# Patient Record
Sex: Female | Born: 1965 | Race: Black or African American | Hispanic: No | Smoking: Never smoker
Health system: Southern US, Community
[De-identification: ages and names within clinical notes are randomized; demographics above are authoritative.]

## PROBLEM LIST (undated history)

## (undated) DIAGNOSIS — E785 Hyperlipidemia, unspecified: Secondary | ICD-10-CM

## (undated) DIAGNOSIS — J189 Pneumonia, unspecified organism: Secondary | ICD-10-CM

## (undated) DIAGNOSIS — K219 Gastro-esophageal reflux disease without esophagitis: Secondary | ICD-10-CM

## (undated) DIAGNOSIS — E8809 Other disorders of plasma-protein metabolism, not elsewhere classified: Secondary | ICD-10-CM

## (undated) DIAGNOSIS — R011 Cardiac murmur, unspecified: Secondary | ICD-10-CM

## (undated) DIAGNOSIS — E11319 Type 2 diabetes mellitus with unspecified diabetic retinopathy without macular edema: Secondary | ICD-10-CM

## (undated) DIAGNOSIS — I10 Essential (primary) hypertension: Secondary | ICD-10-CM

## (undated) DIAGNOSIS — R079 Chest pain, unspecified: Secondary | ICD-10-CM

## (undated) DIAGNOSIS — N183 Chronic kidney disease, stage 3 unspecified: Secondary | ICD-10-CM

## (undated) DIAGNOSIS — D649 Anemia, unspecified: Secondary | ICD-10-CM

## (undated) DIAGNOSIS — I509 Heart failure, unspecified: Secondary | ICD-10-CM

## (undated) HISTORY — PX: CARDIAC CATHETERIZATION: SHX172

## (undated) HISTORY — DX: Chronic kidney disease, stage 3 unspecified: N18.30

## (undated) HISTORY — PX: EYE SURGERY: SHX253

## (undated) HISTORY — PX: BREAST SURGERY: SHX581

## (undated) HISTORY — PX: DILATION AND CURETTAGE OF UTERUS: SHX78

## (undated) HISTORY — DX: Chronic kidney disease, stage 3 (moderate): N18.3

## (undated) HISTORY — DX: Other disorders of plasma-protein metabolism, not elsewhere classified: E88.09

## (undated) HISTORY — DX: Anemia, unspecified: D64.9

## (undated) HISTORY — DX: Chest pain, unspecified: R07.9

---
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
## 1993-01-31 HISTORY — PX: TUBAL LIGATION: SHX77

## 2010-11-29 ENCOUNTER — Emergency Department (HOSPITAL_COMMUNITY): Payer: Self-pay

## 2010-11-29 ENCOUNTER — Emergency Department (HOSPITAL_COMMUNITY)
Admission: EM | Admit: 2010-11-29 | Discharge: 2010-11-29 | Disposition: A | Discharge status: Home / Self Care | Payer: Self-pay | Attending: Emergency Medicine | Admitting: Emergency Medicine

## 2010-11-29 DIAGNOSIS — E785 Hyperlipidemia, unspecified: Secondary | ICD-10-CM | POA: Insufficient documentation

## 2010-11-29 DIAGNOSIS — Z91199 Patient's noncompliance with other medical treatment and regimen due to unspecified reason: Secondary | ICD-10-CM | POA: Insufficient documentation

## 2010-11-29 DIAGNOSIS — R350 Frequency of micturition: Secondary | ICD-10-CM | POA: Insufficient documentation

## 2010-11-29 DIAGNOSIS — I10 Essential (primary) hypertension: Secondary | ICD-10-CM | POA: Insufficient documentation

## 2010-11-29 DIAGNOSIS — R358 Other polyuria: Secondary | ICD-10-CM | POA: Insufficient documentation

## 2010-11-29 DIAGNOSIS — R079 Chest pain, unspecified: Secondary | ICD-10-CM | POA: Insufficient documentation

## 2010-11-29 DIAGNOSIS — R3589 Other polyuria: Secondary | ICD-10-CM | POA: Insufficient documentation

## 2010-11-29 DIAGNOSIS — E119 Type 2 diabetes mellitus without complications: Secondary | ICD-10-CM | POA: Insufficient documentation

## 2010-11-29 DIAGNOSIS — R5383 Other fatigue: Secondary | ICD-10-CM | POA: Insufficient documentation

## 2010-11-29 DIAGNOSIS — R5381 Other malaise: Secondary | ICD-10-CM | POA: Insufficient documentation

## 2010-11-29 DIAGNOSIS — IMO0001 Reserved for inherently not codable concepts without codable children: Secondary | ICD-10-CM | POA: Insufficient documentation

## 2010-11-29 DIAGNOSIS — Z9119 Patient's noncompliance with other medical treatment and regimen: Secondary | ICD-10-CM | POA: Insufficient documentation

## 2010-11-29 DIAGNOSIS — N390 Urinary tract infection, site not specified: Secondary | ICD-10-CM | POA: Insufficient documentation

## 2010-11-29 LAB — URINALYSIS, ROUTINE W REFLEX MICROSCOPIC
Glucose, UA: >1000 mg/dL — AB
Specific Gravity, Urine: 1.027 (ref 1.005–1.030)
pH: 5 (ref 5.0–8.0)

## 2010-11-29 LAB — CBC
HCT: 36.4 % (ref 36.0–46.0)
Platelets: 267 10*3/uL (ref 150–400)
RBC: 4.03 MIL/uL (ref 3.87–5.11)
RDW: 12.3 % (ref 11.5–15.5)
WBC: 5.8 10*3/uL (ref 4.0–10.5)

## 2010-11-29 LAB — DIFFERENTIAL
Basophils Absolute: 0 10*3/uL (ref 0.0–0.1)
Eosinophils Absolute: 0.1 10*3/uL (ref 0.0–0.7)
Eosinophils Relative: 1 % (ref 0–5)
Lymphocytes Relative: 31 % (ref 12–46)
Neutrophils Relative %: 63 % (ref 43–77)

## 2010-11-29 LAB — GLUCOSE, CAPILLARY: Glucose-Capillary: 490 mg/dL — ABNORMAL HIGH (ref 70–99)

## 2010-11-29 LAB — POCT I-STAT 3, VENOUS BLOOD GAS (G3P V)
pCO2, Ven: 51.9 mmHg — ABNORMAL HIGH (ref 45.0–50.0)
pO2, Ven: 23 mmHg — CL (ref 30.0–45.0)

## 2010-11-29 LAB — BASIC METABOLIC PANEL
CO2: 23 mEq/L (ref 19–32)
Glucose, Bld: 448 mg/dL — ABNORMAL HIGH (ref 70–99)
Potassium: 4.2 mEq/L (ref 3.5–5.1)
Sodium: 131 mEq/L — ABNORMAL LOW (ref 135–145)

## 2010-11-29 LAB — URINE MICROSCOPIC-ADD ON

## 2011-03-18 ENCOUNTER — Other Ambulatory Visit: Payer: Self-pay

## 2011-03-18 ENCOUNTER — Emergency Department (HOSPITAL_COMMUNITY): Payer: 59

## 2011-03-18 ENCOUNTER — Encounter (HOSPITAL_COMMUNITY): Payer: Self-pay

## 2011-03-18 ENCOUNTER — Inpatient Hospital Stay (HOSPITAL_COMMUNITY)
Admission: EM | Admit: 2011-03-18 | Discharge: 2011-03-19 | DRG: 313 | Disposition: A | Discharge status: Home / Self Care | Payer: 59 | Attending: Internal Medicine | Admitting: Internal Medicine

## 2011-03-18 DIAGNOSIS — R0789 Other chest pain: Principal | ICD-10-CM | POA: Diagnosis present

## 2011-03-18 DIAGNOSIS — Z23 Encounter for immunization: Secondary | ICD-10-CM

## 2011-03-18 DIAGNOSIS — Z7982 Long term (current) use of aspirin: Secondary | ICD-10-CM

## 2011-03-18 DIAGNOSIS — Z79899 Other long term (current) drug therapy: Secondary | ICD-10-CM

## 2011-03-18 DIAGNOSIS — E1151 Type 2 diabetes mellitus with diabetic peripheral angiopathy without gangrene: Secondary | ICD-10-CM | POA: Diagnosis present

## 2011-03-18 DIAGNOSIS — E669 Obesity, unspecified: Secondary | ICD-10-CM

## 2011-03-18 DIAGNOSIS — IMO0002 Reserved for concepts with insufficient information to code with codable children: Secondary | ICD-10-CM | POA: Diagnosis present

## 2011-03-18 DIAGNOSIS — Z6830 Body mass index (BMI) 30.0-30.9, adult: Secondary | ICD-10-CM

## 2011-03-18 DIAGNOSIS — IMO0001 Reserved for inherently not codable concepts without codable children: Secondary | ICD-10-CM | POA: Diagnosis present

## 2011-03-18 DIAGNOSIS — E1165 Type 2 diabetes mellitus with hyperglycemia: Secondary | ICD-10-CM

## 2011-03-18 DIAGNOSIS — I739 Peripheral vascular disease, unspecified: Secondary | ICD-10-CM | POA: Diagnosis present

## 2011-03-18 DIAGNOSIS — R079 Chest pain, unspecified: Secondary | ICD-10-CM

## 2011-03-18 DIAGNOSIS — Z794 Long term (current) use of insulin: Secondary | ICD-10-CM

## 2011-03-18 DIAGNOSIS — I10 Essential (primary) hypertension: Secondary | ICD-10-CM | POA: Diagnosis present

## 2011-03-18 DIAGNOSIS — E785 Hyperlipidemia, unspecified: Secondary | ICD-10-CM | POA: Diagnosis present

## 2011-03-18 HISTORY — DX: Hyperlipidemia, unspecified: E78.5

## 2011-03-18 HISTORY — DX: Essential (primary) hypertension: I10

## 2011-03-18 LAB — CBC
HCT: 34.3 % — ABNORMAL LOW (ref 36.0–46.0)
Hemoglobin: 12 g/dL (ref 12.0–15.0)
MCH: 30.6 pg (ref 26.0–34.0)
MCHC: 35 g/dL (ref 30.0–36.0)
MCV: 87.5 fL (ref 78.0–100.0)
Platelets: 253 10*3/uL (ref 150–400)
RBC: 3.92 MIL/uL (ref 3.87–5.11)
RDW: 12.9 % (ref 11.5–15.5)
WBC: 4 10*3/uL (ref 4.0–10.5)

## 2011-03-18 LAB — LIPID PANEL
Cholesterol: 253 mg/dL — ABNORMAL HIGH (ref 0–200)
HDL: 45 mg/dL (ref >39)
LDL Cholesterol: 174 mg/dL — ABNORMAL HIGH (ref 0–99)
Total CHOL/HDL Ratio: 5.6 RATIO
Triglycerides: 171 mg/dL — ABNORMAL HIGH (ref <150)
VLDL: 34 mg/dL (ref 0–40)

## 2011-03-18 LAB — BASIC METABOLIC PANEL
BUN: 16 mg/dL (ref 6–23)
CO2: 23 mEq/L (ref 19–32)
Calcium: 8.9 mg/dL (ref 8.4–10.5)
Chloride: 98 mEq/L (ref 96–112)
Creatinine, Ser: 1.05 mg/dL (ref 0.50–1.10)
GFR calc Af Amer: 74 mL/min — ABNORMAL LOW (ref >90)
GFR calc non Af Amer: 64 mL/min — ABNORMAL LOW (ref >90)
Glucose, Bld: 584 mg/dL (ref 70–99)
Potassium: 4.3 mEq/L (ref 3.5–5.1)
Sodium: 130 mEq/L — ABNORMAL LOW (ref 135–145)

## 2011-03-18 LAB — TSH: TSH: 0.466 u[IU]/mL (ref 0.350–4.500)

## 2011-03-18 LAB — GLUCOSE, CAPILLARY: Glucose-Capillary: 580 mg/dL (ref 70–99)

## 2011-03-18 LAB — CARDIAC PANEL(CRET KIN+CKTOT+MB+TROPI)
CK, MB: 1.4 ng/mL (ref 0.3–4.0)
Relative Index: INVALID (ref 0.0–2.5)
Total CK: 50 U/L (ref 7–177)
Troponin I: <0.3 ng/mL (ref <0.30)

## 2011-03-18 LAB — HEMOGLOBIN A1C
Hgb A1c MFr Bld: 15.4 % — ABNORMAL HIGH (ref <5.7)
Mean Plasma Glucose: 395 mg/dL — ABNORMAL HIGH (ref <117)

## 2011-03-18 LAB — TROPONIN I: Troponin I: <0.3 ng/mL (ref <0.30)

## 2011-03-18 MED ORDER — ACETAMINOPHEN 325 MG PO TABS
650.0000 mg | ORAL_TABLET | Freq: Four times a day (QID) | ORAL | Status: DC | PRN
  Administered 2011-03-18: 650 mg via ORAL
  Filled 2011-03-18: qty 2

## 2011-03-18 MED ORDER — INSULIN ASPART 100 UNIT/ML ~~LOC~~ SOLN
0.0000 [IU] | Freq: Three times a day (TID) | SUBCUTANEOUS | Status: DC
  Administered 2011-03-19 (×2): 8 [IU] via SUBCUTANEOUS

## 2011-03-18 MED ORDER — INSULIN ASPART 100 UNIT/ML ~~LOC~~ SOLN
0.0000 [IU] | Freq: Every day | SUBCUTANEOUS | Status: DC
  Administered 2011-03-18: 5 [IU] via SUBCUTANEOUS
  Filled 2011-03-18: qty 3

## 2011-03-18 MED ORDER — ENOXAPARIN SODIUM 40 MG/0.4ML ~~LOC~~ SOLN
40.0000 mg | SUBCUTANEOUS | Status: DC
  Filled 2011-03-18 (×2): qty 0.4

## 2011-03-18 MED ORDER — LISINOPRIL-HYDROCHLOROTHIAZIDE 10-12.5 MG PO TABS: 1.0000 | ORAL_TABLET | Freq: Every day | ORAL | Status: DC

## 2011-03-18 MED ORDER — INSULIN ASPART 100 UNIT/ML ~~LOC~~ SOLN
10.0000 [IU] | Freq: Once | SUBCUTANEOUS | Status: AC
  Administered 2011-03-18: 10 [IU] via INTRAVENOUS
  Filled 2011-03-18: qty 1

## 2011-03-18 MED ORDER — INFLUENZA VIRUS VACC SPLIT PF IM SUSP
0.5000 mL | INTRAMUSCULAR | Status: AC
  Administered 2011-03-19: 0.5 mL via INTRAMUSCULAR
  Filled 2011-03-18: qty 0.5

## 2011-03-18 MED ORDER — SODIUM CHLORIDE 0.9 % IJ SOLN: 3.0000 mL | INTRAMUSCULAR | Status: DC | PRN

## 2011-03-18 MED ORDER — ASPIRIN 81 MG PO TABS: 81.0000 mg | ORAL_TABLET | Freq: Every day | ORAL | Status: DC

## 2011-03-18 MED ORDER — ASPIRIN EC 81 MG PO TBEC
81.0000 mg | DELAYED_RELEASE_TABLET | Freq: Every day | ORAL | Status: DC
  Administered 2011-03-19: 81 mg via ORAL
  Filled 2011-03-18 (×2): qty 1

## 2011-03-18 MED ORDER — EZETIMIBE 10 MG PO TABS
10.0000 mg | ORAL_TABLET | Freq: Every day | ORAL | Status: DC
  Administered 2011-03-18 – 2011-03-19 (×2): 10 mg via ORAL
  Filled 2011-03-18 (×2): qty 1

## 2011-03-18 MED ORDER — LISINOPRIL 10 MG PO TABS
10.0000 mg | ORAL_TABLET | Freq: Every day | ORAL | Status: DC
  Administered 2011-03-18 – 2011-03-19 (×2): 10 mg via ORAL
  Filled 2011-03-18 (×2): qty 1

## 2011-03-18 MED ORDER — SODIUM CHLORIDE 0.9 % IV SOLN: INTRAVENOUS | Status: DC

## 2011-03-18 MED ORDER — INSULIN GLARGINE 100 UNIT/ML ~~LOC~~ SOLN
20.0000 [IU] | Freq: Every day | SUBCUTANEOUS | Status: DC
  Administered 2011-03-18: 20 [IU] via SUBCUTANEOUS
  Filled 2011-03-18: qty 3

## 2011-03-18 MED ORDER — INSULIN ASPART 100 UNIT/ML ~~LOC~~ SOLN
4.0000 [IU] | Freq: Three times a day (TID) | SUBCUTANEOUS | Status: DC
  Administered 2011-03-19 (×2): 4 [IU] via SUBCUTANEOUS

## 2011-03-18 MED ORDER — SODIUM CHLORIDE 0.9 % IJ SOLN
3.0000 mL | Freq: Two times a day (BID) | INTRAMUSCULAR | Status: DC
  Administered 2011-03-18 – 2011-03-19 (×2): 3 mL via INTRAVENOUS

## 2011-03-18 MED ORDER — ROSUVASTATIN CALCIUM 40 MG PO TABS
40.0000 mg | ORAL_TABLET | Freq: Every day | ORAL | Status: DC
  Administered 2011-03-18 – 2011-03-19 (×2): 40 mg via ORAL
  Filled 2011-03-18 (×2): qty 1

## 2011-03-18 MED ORDER — METOPROLOL SUCCINATE ER 100 MG PO TB24
100.0000 mg | ORAL_TABLET | Freq: Every day | ORAL | Status: DC
  Administered 2011-03-18 – 2011-03-19 (×2): 100 mg via ORAL
  Filled 2011-03-18 (×2): qty 1

## 2011-03-18 MED ORDER — PNEUMOCOCCAL VAC POLYVALENT 25 MCG/0.5ML IJ INJ
0.5000 mL | INJECTION | INTRAMUSCULAR | Status: AC
  Administered 2011-03-19: 0.5 mL via INTRAMUSCULAR
  Filled 2011-03-18: qty 0.5

## 2011-03-18 MED ORDER — HYDROCHLOROTHIAZIDE 12.5 MG PO CAPS
12.5000 mg | ORAL_CAPSULE | Freq: Every day | ORAL | Status: DC
  Administered 2011-03-18 – 2011-03-19 (×2): 12.5 mg via ORAL
  Filled 2011-03-18 (×2): qty 1

## 2011-03-18 MED ORDER — NITROGLYCERIN 0.4 MG SL SUBL: 0.4000 mg | SUBLINGUAL_TABLET | SUBLINGUAL | Status: DC | PRN

## 2011-03-18 MED ORDER — ONDANSETRON HCL 4 MG/2ML IJ SOLN: 4.0000 mg | Freq: Three times a day (TID) | INTRAMUSCULAR | Status: DC | PRN

## 2011-03-18 MED ORDER — SODIUM CHLORIDE 0.9 % IV SOLN: 250.0000 mL | INTRAVENOUS | Status: DC | PRN

## 2011-03-18 MED ORDER — MORPHINE SULFATE 2 MG/ML IJ SOLN: 2.0000 mg | INTRAMUSCULAR | Status: DC | PRN

## 2011-03-18 NOTE — Progress Notes (Signed)
Refused lantus dose. Sts she takes Levamir 25 units every am. Will allow night RN to reassess her CBG and decide whether or not to take lantus dose.

## 2011-03-18 NOTE — H&P (Signed)
Hospital Admission Note Date: 03/18/2011  Patient name: Jade Bond Medical record number: [id number] Date of birth: 04-25-1965 Age: 46 y.o. Gender: female PCP: DEFAULT,PROVIDER, MD, MD  Medical Service: Int Med  Attending physician: Drue Second    Pager: Resident (R2/R3): [place]    Pager: [phone] Resident (R1): [place]   Pager: [phone]  Chief Complaint: Chest Pain  History of Present Illness: Patient is a very pleasant 46 year old woman with a history of diabetes, hypertension, hyperlipidemia who presents with chest pain. Patient says that she was rushing from her car to work this morning and as she started climbing stairs that she felt a sudden sharp chest pain to her left chest that radiated to her left shoulder. At the time she got inside her work, the pain was throbbing, the patient began feeling some nausea, had some diaphoresis, and sat down. Her colleagues called EMS, and she began to feel short of breath.  The pain did not radiate to her jaw or down her arm, just her left shoulder. As the EMS arrived a few minutes later, her chest pain started easing off, and she experienced complete relief with one sublingual nitroglycerin. In the ambulance the pain was gone completely, and it has not recurred since.  This chest pain comes amidst a background of intermittent chest pain that started roughly 5 years ago, when the patient experienced some exertional chest pain back in [place]. At that time she had a negative stress test. One year later when her chest pain recurred, she had a cardiac cath, which was also negative. Since then she has had what appears to be stable angina, with positive exertional chest pain that is relieved with rest. Today's chest pain, however, was significantly worse than these episodes.  Past Medical History: Past Medical History  Diagnosis Date  . Hypertension   . Hyperlipidemia   . IDDM (insulin dependent diabetes mellitus)    Past Surgical History  Procedure  Date  . Cardiac catheterization     2010; negative, no intervention needed    Meds: Prior to Admission medications   Medication Sig Start Date End Date Taking? Authorizing Provider  aspirin 81 MG tablet Take 81 mg by mouth daily.   Yes Historical Provider, MD  atorvastatin (LIPITOR) 20 MG tablet Take 20 mg by mouth daily.   Yes Historical Provider, MD  ezetimibe (ZETIA) 10 MG tablet Take 10 mg by mouth daily.   Yes Historical Provider, MD  glimepiride (AMARYL) 2 MG tablet Take 2 mg by mouth 2 (two) times daily.   Yes Historical Provider, MD  guaiFENesin-dextromethorphan (ROBITUSSIN DM) 100-10 MG/5ML syrup Take 10 mLs by mouth 3 (three) times daily as needed. Congestion   Yes Historical Provider, MD  insulin detemir (LEVEMIR) 100 UNIT/ML injection Inject 25 Units into the skin daily.   Yes Historical Provider, MD  lisinopril-hydrochlorothiazide (PRINZIDE,ZESTORETIC) 10-12.5 MG per tablet Take 1 tablet by mouth daily.   Yes Historical Provider, MD  metFORMIN (GLUCOPHAGE) 500 MG tablet Take 500 mg by mouth daily.   Yes Historical Provider, MD  metoprolol succinate (TOPROL-XL) 100 MG 24 hr tablet Take 100 mg by mouth daily. Take with or immediately following a meal.   Yes Historical Provider, MD  pioglitazone (ACTOS) 45 MG tablet Take 45 mg by mouth daily.   Yes Historical Provider, MD  Pseudoeph-Doxylamine-DM-APAP (NYQUIL PO) Take 2 tablets by mouth daily as needed. Cold symptoms   Yes Historical Provider, MD  sitaGLIPtan-metformin (JANUMET) 50-1000 MG per tablet Take 1 tablet by mouth 2 (  two) times daily with a meal.   Yes Historical Provider, MD   Allergies: Review of patient's allergies indicates no known allergies.  Family Hx: Father with MI  Social Hx: History   Social History  . Marital Status: Legally Separated    Spouse Name: N/A    Number of Children: N/A  . Years of Education: N/A   Occupational History  . Supervisor [employer]   Social History Main Topics  . Smoking  status: Never Smoker   . Smokeless tobacco: Not on file  . Alcohol Use: No  . Drug Use: No  . Sexually Active:    Other Topics Concern  . Not on file   Social History Narrative  . Lives here in [place] with her mother and 70 yo daughter.  Husband lives in [place].  Has 31 and 3 yo sons.      Review of Systems: Denies SOB, dyuria, changes in bowel habits  Physical Exam: Filed Vitals:   03/18/11 1235 03/18/11 1537  BP: 155/88 126/72  Pulse: 99 80  Temp: 98.4 F (36.9 C) 98.5 F (36.9 C)  TempSrc: Oral Oral  Resp: 20   SpO2: 97% 98%   GEN: No apparent distress.  Alert and oriented x 3.  Pleasant, conversant, and cooperative to exam. HEENT: head is autraumatic and normocephalic.  Neck is supple without palpable masses or lymphadenopathy.  No JVD or carotid bruits.  Vision intact.  EOMI.  PERRLA.  Sclerae anicteric.  Conjunctivae without pallor or injection. Mucous membranes are moist.  Oropharynx is without erythema, exudates, or other abnormal lesions. RESP:  Lungs are clear to ascultation bilaterally with good air movement.  No wheezes, ronchi, or rubs. CARDIOVASCULAR: regular rate, normal rhythm.  Clear S1, S2, 2/6 HSM @ LSB ABDOMEN: soft, non-tender, non-distended.  Bowels sounds present in all quadrants and normoactive.  No palpable masses. EXT: warm and dry.  Peripheral pulses equal, intact, and +2 globally.  No clubbing or cyanosis.  No edema in b/l lower extremeties SKIN: warm and dry with normal turgor.  No rashes or abnormal lesions observed. NEURO: CN II-XII grossly intact.  Muscle strength +5/5 in bilateral upper and lower extremities.  Sensation is grossly intact.  No focal deficit.  Lab results: Basic Metabolic Panel:  Basename 03/18/11 1230  NA 130*  K 4.3  CL 98  CO2 23  GLUCOSE 584*  BUN 16  CREATININE 1.05  CALCIUM 8.9  MG --  PHOS --   CBC:  Basename 03/18/11 1230  WBC 4.0  NEUTROABS --  HGB 12.0  HCT 34.3*  MCV 87.5  PLT 253   Cardiac  Enzymes:  Basename 03/18/11 1231  CKTOTAL --  CKMB --  CKMBINDEX --  TROPONINI <0.30   CBG:  Basename 03/18/11 1510 03/18/11 1453 03/18/11 1233  GLUCAP 99 109* 580*    Imaging results:  Dg Chest 2 View  03/18/2011  *RADIOLOGY REPORT*  Clinical Data: Chest pain, cough, nausea  CHEST - 2 VIEW  Comparison: Chest x-ray of 11/29/2010  Findings: No active infiltrate or effusion is seen.  Mediastinal contours appear stable.  The heart is within normal limits in size. No bony abnormality is seen.  IMPRESSION:   No active lung disease.  Original Report Authenticated By: Juline Patch, M.D.    Other results: EKG: NSR @ 96  Assessment & Plan by Problem: # Chest Pain Patient presents with exertional chest pain that is worse than her typical stable angina. Risk factors include diabetes, hypertension,  hyperlipidemia. She does have negative stress test and negative cath roughly 4 and 5 years ago, but since moving from [place] she seems to have poor medical compliance. Her story is consistent with stable angina, and her EKG and first troponin are reassuring. PE is less likely given her low-risk profile, and aortic dissection or esophageal pathology do not fit with her history. While this does sound like stable angina, the patient does have significant risk factors and a long history of angina, so we will risk stratify her and get an inpatient stress test. - Cycle cardiac enzymes - Repeat EKG in a.m. - Nuclear stress test tomorrow - N.p.o. after midnight with and and no caffeine - TSH - Morphine when necessary - Nitroglycerin when necessary - asa 81mg   # Hypertension - Continue Toprol XL 100 - Continue Lisinopril-HCTZ  # Diabetes Patient reports poor compliance with current medicine regimen - Check A1c - lantus 20 units qhs - SSI moderate - Hold oral medicine  # Hyperlipidemia - fasting lipid panel - Rosuvastatin - zetia  # DVT - Enoxaparin

## 2011-03-18 NOTE — ED Notes (Signed)
Pt was walking from her car into her work this morning at 1100. She states that she developed some left sided chest pain that was radiating into her back. She described the pain as stabbing in nature. She states that she also developed nausea and shortness of breath with this chest pain. Upon ems arrival her cbg was 580. She was also hypertensive with initial pressure of 220/140 with st on the monitor at a rate of 120. Pt had received 324mg  asa and 1 sl nitro pta. After 20 minutes the patient stated that her pain had gone from 10/10 to 0/10. Vitals obtained with ems again and after pt stated that her pain had improved her ekg returned to nsr rate of 80 and last bp obtained was 147/90. Iv started pta by ems. Protocols initiated upon arrival to dept.

## 2011-03-18 NOTE — ED Notes (Signed)
Resting quietly on stretcher with eyes closed; easily arousable; no complaints at this time

## 2011-03-18 NOTE — ED Provider Notes (Signed)
History    46 year old female with chest pain. Sudden onset while she was walking and her face were. Pain was stabbing in nature and substernal. Radiation to her left shoulder her back. Associated with nausea and dyspnea. Chills. No cough. No unusual leg pain or swelling. Denies history of known CAD but has multiple risk factors including hypertension, diabetes, and obesity.  CSN: [id number]  Arrival date & time 03/18/11  1209   First MD Initiated Contact with Patient 03/18/11 1211      Chief Complaint  Patient presents with  . Chest Pain    (Consider location/radiation/quality/duration/timing/severity/associated sxs/prior treatment) HPI  Past Medical History  Diagnosis Date  . Hypertension   . Hyperlipidemia   . IDDM (insulin dependent diabetes mellitus)     Past Surgical History  Procedure Date  . Cardiac catheterization     2010; negative, no intervention needed    History reviewed. No pertinent family history.  History  Substance Use Topics  . Smoking status: Never Smoker   . Smokeless tobacco: Not on file  . Alcohol Use: No    OB History    Grav Para Term Preterm Abortions TAB SAB Ect Mult Living                  Review of Systems   Review of symptoms negative unless otherwise noted in HPI.   Allergies  Review of patient's allergies indicates no known allergies.  Home Medications   Current Outpatient Rx  Name Route Sig Dispense Refill  . ASPIRIN 81 MG PO TABS Oral Take 81 mg by mouth daily.    . ATORVASTATIN CALCIUM 20 MG PO TABS Oral Take 20 mg by mouth daily.    Marland Kitchen EZETIMIBE 10 MG PO TABS Oral Take 10 mg by mouth daily.    Marland Kitchen GLIMEPIRIDE 2 MG PO TABS Oral Take 2 mg by mouth 2 (two) times daily.    . GUAIFENESIN-DM 100-10 MG/5ML PO SYRP Oral Take 10 mLs by mouth 3 (three) times daily as needed. Congestion    . INSULIN DETEMIR 100 UNIT/ML [place] SOLN Subcutaneous Inject 25 Units into the skin daily.    Marland Kitchen LISINOPRIL-HYDROCHLOROTHIAZIDE 10-12.5 MG PO TABS  Oral Take 1 tablet by mouth daily.    Marland Kitchen METFORMIN HCL 500 MG PO TABS Oral Take 500 mg by mouth daily.    Marland Kitchen METOPROLOL SUCCINATE ER 100 MG PO TB24 Oral Take 100 mg by mouth daily. Take with or immediately following a meal.    . PIOGLITAZONE HCL 45 MG PO TABS Oral Take 45 mg by mouth daily.    . NYQUIL PO Oral Take 2 tablets by mouth daily as needed. Cold symptoms    . SITAGLIPTIN-METFORMIN HCL 50-1000 MG PO TABS Oral Take 1 tablet by mouth 2 (two) times daily with a meal.      BP 155/88  Pulse 99  Temp(Src) 98.4 F (36.9 C) (Oral)  Resp 20  SpO2 97%  LMP 03/03/2011  Physical Exam  Nursing note and vitals reviewed. Constitutional: She appears well-developed and well-nourished. No distress.       Sitting up in bed. No acute distress. Obese  HENT:  Head: Normocephalic and atraumatic.  Eyes: Conjunctivae are normal. Pupils are equal, round, and reactive to light. Right eye exhibits no discharge. Left eye exhibits no discharge.  Neck: Normal range of motion. Neck supple.  Cardiovascular: Normal rate, regular rhythm and normal heart sounds.  Exam reveals no gallop and no friction rub.   No murmur  heard. Pulmonary/Chest: Effort normal and breath sounds normal. No respiratory distress.  Abdominal: Soft. She exhibits no distension. There is no tenderness.  Musculoskeletal: She exhibits no edema and no tenderness.  Neurological: She is alert.  Skin: Skin is warm and dry. She is not diaphoretic.  Psychiatric: She has a normal mood and affect. Her behavior is normal. Thought content normal.    ED Course  Procedures (including critical care time)  Labs Reviewed  CBC - Abnormal; Notable for the following:    HCT 34.3 (*)    All other components within normal limits  BASIC METABOLIC PANEL - Abnormal; Notable for the following:    Sodium 130 (*)    Glucose, Bld 584 (*)    GFR calc non Af Amer 64 (*)    GFR calc Af Amer 74 (*)    All other components within normal limits  GLUCOSE,  CAPILLARY - Abnormal; Notable for the following:    Glucose-Capillary 580 (*)    All other components within normal limits  GLUCOSE, CAPILLARY - Abnormal; Notable for the following:    Glucose-Capillary 109 (*)    All other components within normal limits  GLUCOSE, CAPILLARY - Abnormal; Notable for the following:    Glucose-Capillary 142 (*)    All other components within normal limits  HEMOGLOBIN A1C - Abnormal; Notable for the following:    Hemoglobin A1C 15.4 (*)    Mean Plasma Glucose 395 (*)    All other components within normal limits  LIPID PANEL - Abnormal; Notable for the following:    Cholesterol 253 (*)    Triglycerides 171 (*)    LDL Cholesterol 174 (*)    All other components within normal limits  GLUCOSE, CAPILLARY - Abnormal; Notable for the following:    Glucose-Capillary 377 (*)    All other components within normal limits  TROPONIN I  GLUCOSE, CAPILLARY  TSH  CARDIAC PANEL(CRET KIN+CKTOT+MB+TROPI)  CARDIAC PANEL(CRET KIN+CKTOT+MB+TROPI)  CARDIAC PANEL(CRET KIN+CKTOT+MB+TROPI)   Dg Chest 2 View  03/18/2011  *RADIOLOGY REPORT*  Clinical Data: Chest pain, cough, nausea  CHEST - 2 VIEW  Comparison: Chest x-ray of 11/29/2010  Findings: No active infiltrate or effusion is seen.  Mediastinal contours appear stable.  The heart is within normal limits in size. No bony abnormality is seen.  IMPRESSION:   No active lung disease.  Original Report Authenticated By: Juline Patch, M.D.   EKG:  Rhythm: normal sinus Rate: 90s aXIS: Normal Intervals: Normal ST segments: Mild nondiagnostic ST elevation in V2. There are no reciprical changes. Comparison: None   1. Chest pain   2. Uncontrolled diabetes    MDM  Female with chest pain. Concern for cardiac etiology. Although initial workup was relatively unremarkable aside from hyperglycemia patient has multiple risk factors for CAD and feel that she needs admission for further evaluation.        Raeford Razor,  MD 03/19/11 [phone]

## 2011-03-18 NOTE — Progress Notes (Signed)
Report received from [place], [place] @ [id number]. Arrived to floor at time of note. NAD on admission to unit. Oriented to unit and surroundings. Connected to monitor. Voided on admission. C/o of h/a at this time, on call MD paged. Orders received. No other concerns voiced at this time. See doc flow for admission assessment and VS.

## 2011-03-19 ENCOUNTER — Other Ambulatory Visit: Payer: Self-pay

## 2011-03-19 ENCOUNTER — Encounter: Payer: Self-pay | Admitting: Internal Medicine

## 2011-03-19 ENCOUNTER — Inpatient Hospital Stay (HOSPITAL_COMMUNITY): Payer: 59

## 2011-03-19 DIAGNOSIS — R079 Chest pain, unspecified: Secondary | ICD-10-CM

## 2011-03-19 DIAGNOSIS — I798 Other disorders of arteries, arterioles and capillaries in diseases classified elsewhere: Secondary | ICD-10-CM

## 2011-03-19 DIAGNOSIS — E1159 Type 2 diabetes mellitus with other circulatory complications: Secondary | ICD-10-CM

## 2011-03-19 DIAGNOSIS — I10 Essential (primary) hypertension: Secondary | ICD-10-CM

## 2011-03-19 LAB — CARDIAC PANEL(CRET KIN+CKTOT+MB+TROPI)
CK, MB: 1.4 ng/mL (ref 0.3–4.0)
CK, MB: 1.3 ng/mL (ref 0.3–4.0)
Relative Index: INVALID (ref 0.0–2.5)
Relative Index: INVALID (ref 0.0–2.5)
Total CK: 42 U/L (ref 7–177)
Total CK: 41 U/L (ref 7–177)
Troponin I: <0.3 ng/mL (ref <0.30)
Troponin I: <0.3 ng/mL (ref <0.30)

## 2011-03-19 LAB — GLUCOSE, CAPILLARY: Glucose-Capillary: 299 mg/dL — ABNORMAL HIGH (ref 70–99)

## 2011-03-19 MED ORDER — INSULIN PEN NEEDLE 31G X 5 MM MISC: Status: DC

## 2011-03-19 MED ORDER — METFORMIN HCL 500 MG PO TABS: 1000.0000 mg | ORAL_TABLET | Freq: Two times a day (BID) | ORAL | Status: DC

## 2011-03-19 MED ORDER — INSULIN ASPART 100 UNIT/ML ~~LOC~~ SOLN: SUBCUTANEOUS | Status: DC

## 2011-03-19 MED ORDER — TECHNETIUM TC 99M TETROFOSMIN IV KIT
10.0000 | PACK | Freq: Once | INTRAVENOUS | Status: AC | PRN
  Administered 2011-03-19: 10 via INTRAVENOUS

## 2011-03-19 MED ORDER — TECHNETIUM TC 99M TETROFOSMIN IV KIT
30.0000 | PACK | Freq: Once | INTRAVENOUS | Status: AC | PRN
  Administered 2011-03-19: 30 via INTRAVENOUS

## 2011-03-19 MED ORDER — GLUCOSE BLOOD VI STRP: ORAL_STRIP | Status: DC

## 2011-03-19 MED ORDER — ATORVASTATIN CALCIUM 20 MG PO TABS: 40.0000 mg | ORAL_TABLET | Freq: Every day | ORAL | Status: DC

## 2011-03-19 MED ORDER — INSULIN GLARGINE 100 UNIT/ML ~~LOC~~ SOLN: 30.0000 [IU] | Freq: Every day | SUBCUTANEOUS | Status: DC

## 2011-03-19 MED ORDER — REGADENOSON 0.4 MG/5ML IV SOLN
0.4000 mg | Freq: Once | INTRAVENOUS | Status: AC
  Administered 2011-03-19: 0.4 mg via INTRAVENOUS
  Filled 2011-03-19: qty 5

## 2011-03-19 MED ORDER — NITROGLYCERIN 0.4 MG SL SUBL: 0.4000 mg | SUBLINGUAL_TABLET | SUBLINGUAL | Status: DC | PRN

## 2011-03-19 MED ORDER — ACCU-CHEK AVIVA PLUS W/DEVICE KIT: 1.0000 | PACK | Freq: Three times a day (TID) | Status: DC

## 2011-03-19 NOTE — H&P (Signed)
Internal Medicine Teaching Service Attending Note Date: 03/19/2011  Patient name: Jade Bond  Medical record number: [id number]  Date of birth: [birth year]   I have seen and evaluated Jade Bond and discussed their care with the Residency Team.  Jade Bond is a 46yo F who has h/o DM, HTN, HLD and stable angina. She presents with episode of exertional chest pain which is resolved with rest and nitroglycerin. By the time she arrived to ED, chest pain has completely disappated. Her troponins and ekg are normal. We anticipate to have her undergo cardiac stress test. In addition, we found her DM poorly controlled on oral agents plus lemever. Her HBA1C was 15.4. She was admitted for stress test, better management of risk factors for CAD, and switch her to insulin regimen.  Physical Exam: Blood pressure 137/88, pulse 69, temperature 98.1 F (36.7 C), temperature source Oral, resp. rate 14, height 5\' 6"  (1.676 m), weight 187 lb 1.6 oz (84.868 kg), last menstrual period 03/03/2011, SpO2 99.00%.  GEN: No apparent distress. Alert and oriented x 3. Pleasant, conversant, and cooperative to exam.  HEENT: head is autraumatic and normocephalic. Neck is supple without palpable masses or lymphadenopathy. No JVD or carotid bruits. Vision intact. EOMI. PERRLA. Sclerae anicteric. Conjunctivae without pallor or injection. Mucous membranes are moist. Oropharynx is without erythema, exudates, or other abnormal lesions.  RESP: Lungs are clear to ascultation bilaterally with good air movement. No wheezes, ronchi, or rubs.  CARDIOVASCULAR: regular rate, normal rhythm. Clear S1, S2, 2/6 diastolic M @ LUSB  ABDOMEN: soft, non-tender, non-distended. Bowels sounds present in all quadrants and normoactive. No palpable masses.  EXT: warm and dry. Peripheral pulses equal, intact, and +2 globally. No clubbing or cyanosis. No edema in b/l lower extremeties  SKIN: warm and dry with normal turgor. No rashes or abnormal  lesions observed.  NEURO: CN II-XII grossly intact. Muscle strength +5/5 in bilateral upper and lower extremities. Sensation is grossly intact. No focal deficit.  Lab results: Results for orders placed during the hospital encounter of 03/18/11 (from the past 24 hour(s))  GLUCOSE, CAPILLARY     Status: Abnormal   Collection Time   03/18/11  2:53 PM      Component Value Range   Glucose-Capillary 109 (*) 70 - 99 (mg/dL)   Comment 1 Notify RN     Comment 2 Documented in Chart    GLUCOSE, CAPILLARY     Status: Normal   Collection Time   03/18/11  3:10 PM      Component Value Range   Glucose-Capillary 99  70 - 99 (mg/dL)   Comment 1 Notify RN     Comment 2 Documented in Chart    GLUCOSE, CAPILLARY     Status: Abnormal   Collection Time   03/18/11  5:18 PM      Component Value Range   Glucose-Capillary 142 (*) 70 - 99 (mg/dL)   Comment 1 Notify RN    TSH     Status: Normal   Collection Time   03/18/11  6:41 PM      Component Value Range   TSH 0.466  0.350 - 4.500 (uIU/mL)  HEMOGLOBIN A1C     Status: Abnormal   Collection Time   03/18/11  6:41 PM      Component Value Range   Hemoglobin A1C 15.4 (*) <5.7 (%)   Mean Plasma Glucose 395 (*) <117 (mg/dL)  CARDIAC PANEL(CRET KIN+CKTOT+MB+TROPI)     Status: Normal   Collection Time  03/18/11  6:41 PM      Component Value Range   Total CK 50  7 - 177 (U/L)   CK, MB 1.4  0.3 - 4.0 (ng/mL)   Troponin I <0.30  <0.30 (ng/mL)   Relative Index RELATIVE INDEX IS INVALID  0.0 - 2.5   LIPID PANEL     Status: Abnormal   Collection Time   03/18/11  6:41 PM      Component Value Range   Cholesterol 253 (*) 0 - 200 (mg/dL)   Triglycerides 161 (*) <150 (mg/dL)   HDL 45  >09 (mg/dL)   Total CHOL/HDL Ratio 5.6     VLDL 34  0 - 40 (mg/dL)   LDL Cholesterol 604 (*) 0 - 99 (mg/dL)  GLUCOSE, CAPILLARY     Status: Abnormal   Collection Time   03/18/11  9:16 PM      Component Value Range   Glucose-Capillary 377 (*) 70 - 99 (mg/dL)  CARDIAC PANEL(CRET  KIN+CKTOT+MB+TROPI)     Status: Normal   Collection Time   03/19/11  1:59 AM      Component Value Range   Total CK 42  7 - 177 (U/L)   CK, MB 1.4  0.3 - 4.0 (ng/mL)   Troponin I <0.30  <0.30 (ng/mL)   Relative Index RELATIVE INDEX IS INVALID  0.0 - 2.5   GLUCOSE, CAPILLARY     Status: Abnormal   Collection Time   03/19/11  8:01 AM      Component Value Range   Glucose-Capillary 299 (*) 70 - 99 (mg/dL)  CARDIAC PANEL(CRET KIN+CKTOT+MB+TROPI)     Status: Normal   Collection Time   03/19/11 12:57 PM      Component Value Range   Total CK 41  7 - 177 (U/L)   CK, MB 1.3  0.3 - 4.0 (ng/mL)   Troponin I <0.30  <0.30 (ng/mL)   Relative Index RELATIVE INDEX IS INVALID  0.0 - 2.5   GLUCOSE, CAPILLARY     Status: Abnormal   Collection Time   03/19/11  1:13 PM      Component Value Range   Glucose-Capillary 259 (*) 70 - 99 (mg/dL)    Imaging results:  Dg Chest 2 View  03/18/2011  *RADIOLOGY REPORT*  Clinical Data: Chest pain, cough, nausea  CHEST - 2 VIEW  Comparison: Chest x-ray of 11/29/2010  Findings: No active infiltrate or effusion is seen.  Mediastinal contours appear stable.  The heart is within normal limits in size. No bony abnormality is seen.  IMPRESSION:   No active lung disease.  Original Report Authenticated By: Juline Patch, M.D.   Nm Myocar Multi W/spect W/wall Motion / Ef  03/19/2011  *RADIOLOGY REPORT*  Clinical data: Chest pain  NUCLEAR MEDICINE MYOCARDIAL PERFUSION IMAGING NUCLEAR MEDICINE LEFT VENTRICULAR WALL MOTION ANALYSIS NUCLEAR MEDICINE LEFT VENTRICULAR EJECTION FRACTION CALCULATION  Technique: Standard single day myocardial SPECT imaging was performed after resting intravenous injection of Tc-23m Myoview. After intravenous infusion of Lexiscan (regadenoson) under supervision of cardiology staff, Myoview was injected intravenously and standard myocardial SPECT imaging was performed. Quantitative gated imaging was also performed to evaluate left ventricular wall motion and  estimate left ventricular ejection fraction.  Radiopharmaceutical: 10.6+33 mCi Tc20m Myoview IV.  Comparison: None  Findings:    The stress SPECT images demonstrate physiologic distribution of radiopharmaceutical. Rest images demonstrate no perfusion defects. The gated stress SPECT images demonstrate normal left ventricular myocardial thickening.  No focal wall motion abnormality is  seen. Calculated left ventricular end-diastolic volume 56ml, end-systolic volume 15ml, ejection fraction of 74%.  IMPRESSION:  1. Negative for pharmacologic-stress induced ischemia.  2. Left ventricular ejection fraction 74%.  Original Report Authenticated By: Osa Craver, M.D.    Assessment and Plan: I agree with the formulated Assessment and Plan as formulated by Dr. Abner Greenspan to improve management of diabetes, hyperlipidemia and evaluate nature of exertional chest pain.

## 2011-03-19 NOTE — Progress Notes (Signed)
Subjective: No events overnight, no c/p Objective: Vital signs in last 24 hours: Filed Vitals:   03/19/11 0600 03/19/11 1110 03/19/11 1112 03/19/11 1115  BP: 136/82 154/88 152/86 144/85  Pulse: 60 107 108 95  Temp: 98.1 F (36.7 C)     TempSrc:      Resp: 14     Height:      Weight:      SpO2: 99%      Weight change:   Intake/Output Summary (Last 24 hours) at 03/19/11 1304 Last data filed at 03/18/11 2304  Gross per 24 hour  Intake    483 ml  Output      0 ml  Net    483 ml   Physical Exam: GEN: NAD.  Alert and oriented x 3.  Pleasant, conversant, and cooperative to exam. RESP:  CTAB, no w/r/r CARDIOVASCULAR: RRR, S1, S2, no m/r/g ABDOMEN: soft, NT/ND, NABS EXT: warm and dry. No edema in b/l LE  Lab Results: Basic Metabolic Panel:  Lab 03/18/11 1324  NA 130*  K 4.3  CL 98  CO2 23  GLUCOSE 584*  BUN 16  CREATININE 1.05  CALCIUM 8.9  MG --  PHOS --   CBC:  Lab 03/18/11 1230  WBC 4.0  NEUTROABS --  HGB 12.0  HCT 34.3*  MCV 87.5  PLT 253   Cardiac Enzymes:  Lab 03/19/11 0159 03/18/11 1841 03/18/11 1231  CKTOTAL 42 50 --  CKMB 1.4 1.4 --  CKMBINDEX -- -- --  TROPONINI <0.30 <0.30 <0.30   CBG:  Lab 03/19/11 0801 03/18/11 2116 03/18/11 1718 03/18/11 1510 03/18/11 1453 03/18/11 1233  GLUCAP 299* 377* 142* 99 109* 580*   Hemoglobin A1C:  Lab 03/18/11 1841  HGBA1C 15.4*   Fasting Lipid Panel:  Lab 03/18/11 1841  CHOL 253*  HDL 45  LDLCALC 174*  TRIG 171*  CHOLHDL 5.6  LDLDIRECT --   Thyroid Function Tests:  Lab 03/18/11 1841  TSH 0.466  T4TOTAL --  FREET4 --  T3FREE --  THYROIDAB --    Micro Results: No results found for this or any previous visit (from the past 240 hour(s)). Studies/Results: Dg Chest 2 View  03/18/2011  *RADIOLOGY REPORT*  Clinical Data: Chest pain, cough, nausea  CHEST - 2 VIEW  Comparison: Chest x-ray of 11/29/2010  Findings: No active infiltrate or effusion is seen.  Mediastinal contours appear stable.  The  heart is within normal limits in size. No bony abnormality is seen.  IMPRESSION:   No active lung disease.  Original Report Authenticated By: Juline Patch, M.D.   Nm Myocar Multi W/spect W/wall Motion / Ef  03/19/2011  *RADIOLOGY REPORT*  Clinical data: Chest pain  NUCLEAR MEDICINE MYOCARDIAL PERFUSION IMAGING NUCLEAR MEDICINE LEFT VENTRICULAR WALL MOTION ANALYSIS NUCLEAR MEDICINE LEFT VENTRICULAR EJECTION FRACTION CALCULATION  Technique: Standard single day myocardial SPECT imaging was performed after resting intravenous injection of Tc-8m Myoview. After intravenous infusion of Lexiscan (regadenoson) under supervision of cardiology staff, Myoview was injected intravenously and standard myocardial SPECT imaging was performed. Quantitative gated imaging was also performed to evaluate left ventricular wall motion and estimate left ventricular ejection fraction.  Radiopharmaceutical: 10.6+33 mCi Tc34m Myoview IV.  Comparison: None  Findings:    The stress SPECT images demonstrate physiologic distribution of radiopharmaceutical. Rest images demonstrate no perfusion defects. The gated stress SPECT images demonstrate normal left ventricular myocardial thickening.  No focal wall motion abnormality is seen. Calculated left ventricular end-diastolic volume 56ml, end-systolic volume 15ml, ejection  fraction of 74%.  IMPRESSION:  1. Negative for pharmacologic-stress induced ischemia.  2. Left ventricular ejection fraction 74%.  Original Report Authenticated By: Osa Craver, M.D.   Medications: I have reviewed the patient's current medications. Scheduled Meds:   . aspirin EC  81 mg Oral Daily  . enoxaparin  40 mg Subcutaneous Q24H  . ezetimibe  10 mg Oral Daily  . hydrochlorothiazide  12.5 mg Oral Daily  . influenza  inactive virus vaccine  0.5 mL Intramuscular Tomorrow-1000  . insulin aspart  0-15 Units Subcutaneous TID WC  . insulin aspart  0-5 Units Subcutaneous QHS  . insulin aspart  10 Units  Intravenous Once  . insulin aspart  4 Units Subcutaneous TID WC  . insulin glargine  20 Units Subcutaneous QHS  . lisinopril  10 mg Oral Daily  . metoprolol succinate  100 mg Oral Daily  . pneumococcal 23 valent vaccine  0.5 mL Intramuscular Tomorrow-1000  . regadenoson  0.4 mg Intravenous Once  . rosuvastatin  40 mg Oral Daily  . sodium chloride  3 mL Intravenous Q12H  . DISCONTD: aspirin  81 mg Oral Daily  . DISCONTD: lisinopril-hydrochlorothiazide  1 tablet Oral Daily   Continuous Infusions:   . DISCONTD: sodium chloride     PRN Meds:.sodium chloride, acetaminophen, morphine, nitroGLYCERIN, sodium chloride, technetium tetrofosmin, technetium tetrofosmin, DISCONTD: ondansetron (ZOFRAN) IV  Assessment/Plan: # Chest Pain  No further CP, myoview negative today.  Likely stable angina.  Will d/c today with risk factor management - d/c today  # Hypertension  - Continue Toprol XL 100  - Continue Lisinopril-HCTZ   # Diabetes  Patient reports poor compliance with current medicine regimen, A1c 15 - d/c on insulin + metformin  # Hyperlipidemia  - fasting lipid panel  - Rosuvastatin  - zetia   # DVT  - Enoxaparin   LOS: 1 day   WILDMAN-TOBRINER, BEN 03/19/2011, 1:04 PM

## 2011-03-19 NOTE — Progress Notes (Signed)
   CARE MANAGEMENT NOTE 03/19/2011  Patient:  TYNES-Bertoni,Prakriti   Account Number:  [id number]  Date Initiated:  03/19/2011  Documentation initiated by:  [facility]  Subjective/Objective Assessment:   CAD, HTN, DM, obesity     Action/Plan:   moved to [place] from [place]   Anticipated DC Date:  03/19/2011   Anticipated DC Plan:  HOME/SELF CARE      DC Planning Services  CM consult  PCP issues      Choice offered to / List presented to:             Status of service:  Completed, signed off Medicare Important Message given?   (If response is "NO", the following Medicare IM given date fields will be blank) Date Medicare IM given:   Date Additional Medicare IM given:    Discharge Disposition:  HOME/SELF CARE  Per UR Regulation:    Comments:  03/19/2011 1500 Contacted pt and states she needs PCP for this area. Explained to pt that she can contact her Health plan for provider list and she can go to the website to search for PCP on her health plans website. Provided number for MD search to locate a PCP in [place]. Isidoro Donning RN CCM Case Mgmt phone [phone]

## 2011-03-19 NOTE — Progress Notes (Signed)
Patient seen briefly in nuclear stress room. Please note this patient is not on our service. Study authorized by Theodore Demark PA-C yesterday. Follow-up results will be available likely in the afternoon under Images. [facility] is responsible for reading stress tests on the weekends so if there is any question about what time study results will be available, please contact that group.  Pre Test: No CP/SOB. VSS. EKG shows NSR TWI avL otherwise no acute changes. During Test: c/o abdominal cramping, SOB. No CP. HR increased to ~110. EKG showed NSR with biphasic T's V4-V6. No acute ST elevation noted. VSS. Post Test: Symptoms are resolving. VSS. EKG is coming back to baseline.  Await interpretation of images.  Lakeyshia Tuckerman PA-C 03/19/2011

## 2011-03-19 NOTE — Discharge Summary (Signed)
Internal Medicine Teaching [facility] Discharge Note  Name: Jade Bond MRN: [id number] DOB: [birth year] 46 y.o.  Date of Admission: 03/18/2011 12:09 PM Date of Discharge: 03/19/2011 Attending Physician: Drue Second  Discharge Diagnosis: Principal Problem:  *Chest pain on exertion Active Problems:  DM (diabetes mellitus) type II uncontrolled, periph vascular disorder  HTN (hypertension)  Obesity  Hyperlipidemia  Discharge Medications: Medication List  As of 03/19/2011  5:10 PM   STOP taking these medications         glimepiride 2 MG tablet      guaiFENesin-dextromethorphan 100-10 MG/5ML syrup      insulin detemir 100 UNIT/ML injection      NYQUIL PO      pioglitazone 45 MG tablet      sitaGLIPtan-metformin 50-1000 MG per tablet         TAKE these medications         ACCU-CHEK AVIVA PLUS W/DEVICE Kit   1 each by Does not apply route 4 (four) times daily -  before meals and at bedtime.      aspirin 81 MG tablet   Take 81 mg by mouth daily.      atorvastatin 20 MG tablet   Commonly known as: LIPITOR   Take 2 tablets (40 mg total) by mouth daily.      ezetimibe 10 MG tablet   Commonly known as: ZETIA   Take 10 mg by mouth daily.      glucose blood test strip   Use as instructed      insulin aspart 100 UNIT/ML injection   Commonly known as: novoLOG   Check blood sugar before each meal and use sliding scale to give insulin as needed.      insulin glargine 100 UNIT/ML injection   Commonly known as: LANTUS   Inject 30 Units into the skin at bedtime.      Insulin Pen Needle 31G X 5 MM Misc   Inject subcuteaneously into skin      lisinopril-hydrochlorothiazide 10-12.5 MG per tablet   Commonly known as: PRINZIDE,ZESTORETIC   Take 1 tablet by mouth daily.      metFORMIN 500 MG tablet   Commonly known as: GLUCOPHAGE   Take 2 tablets (1,000 mg total) by mouth 2 (two) times daily with a meal.      metoprolol succinate 100 MG 24 hr tablet   Commonly known  as: TOPROL-XL   Take 100 mg by mouth daily. Take with or immediately following a meal.      nitroGLYCERIN 0.4 MG SL tablet   Commonly known as: NITROSTAT   Place 1 tablet (0.4 mg total) under the tongue every 5 (five) minutes x 3 doses as needed for chest pain.           Disposition and follow-up:   Ms.Donnita Tynes-Closser was discharged from [facility] in Stable condition.    1. I have contacted Ned Clines and asked her to establish an appointment with the patient.  2.  I have contacted the [facility] and asked them to contact the patient to establish care in our clinic. - diabetes check-in (very poorly controlled, see below in hospital course) - hyperlipidemia recheck in 6 wks - HTN check (under control)  Follow-up Appointments: Follow-up Information    Follow up with DEFAULT,PROVIDER, MD .        Discharge Orders    Future Orders Please Complete By Expires   Diet - low sodium heart healthy  Increase activity slowly         Consultations:  none  Procedures Performed:  Dg Chest 2 View  03/18/2011  *RADIOLOGY REPORT*  Clinical Data: Chest pain, cough, nausea  CHEST - 2 VIEW  Comparison: Chest x-ray of 11/29/2010  Findings: No active infiltrate or effusion is seen.  Mediastinal contours appear stable.  The heart is within normal limits in size. No bony abnormality is seen.  IMPRESSION:   No active lung disease.  Original Report Authenticated By: Juline Patch, M.D.   Nm Myocar Multi W/spect W/wall Motion / Ef  03/19/2011  *RADIOLOGY REPORT*  Clinical data: Chest pain  NUCLEAR MEDICINE MYOCARDIAL PERFUSION IMAGING NUCLEAR MEDICINE LEFT VENTRICULAR WALL MOTION ANALYSIS NUCLEAR MEDICINE LEFT VENTRICULAR EJECTION FRACTION CALCULATION  Technique: Standard single day myocardial SPECT imaging was performed after resting intravenous injection of Tc-47m Myoview. After intravenous infusion of Lexiscan (regadenoson) under supervision of cardiology staff, Myoview was  injected intravenously and standard myocardial SPECT imaging was performed. Quantitative gated imaging was also performed to evaluate left ventricular wall motion and estimate left ventricular ejection fraction.  Radiopharmaceutical: 10.6+33 mCi Tc43m Myoview IV.  Comparison: None  Findings:    The stress SPECT images demonstrate physiologic distribution of radiopharmaceutical. Rest images demonstrate no perfusion defects. The gated stress SPECT images demonstrate normal left ventricular myocardial thickening.  No focal wall motion abnormality is seen. Calculated left ventricular end-diastolic volume 56ml, end-systolic volume 15ml, ejection fraction of 74%.  IMPRESSION:  1. Negative for pharmacologic-stress induced ischemia.  2. Left ventricular ejection fraction 74%.  Original Report Authenticated By: Osa Craver, M.D.   Admission HPI: Patient is a very pleasant 46 year old woman with a history of diabetes, hypertension, hyperlipidemia who presents with chest pain. Patient says that she was rushing from her car to work this morning and as she started climbing stairs that she felt a sudden sharp chest pain to her left chest that radiated to her left shoulder. At the time she got inside her work, the pain was throbbing, the patient began feeling some nausea, had some diaphoresis, and sat down. Her colleagues called EMS, and she began to feel short of breath. The pain did not radiate to her jaw or down her arm, just her left shoulder. As the EMS arrived a few minutes later, her chest pain started easing off, and she experienced complete relief with one sublingual nitroglycerin. In the ambulance the pain was gone completely, and it has not recurred since.  This chest pain comes amidst a background of intermittent chest pain that started roughly 5 years ago, when the patient experienced some exertional chest pain back in [place]. At that time she had a negative stress test. One year later when her chest  pain recurred, she had a cardiac cath, which was also negative. Since then she has had what appears to be stable angina, with positive exertional chest pain that is relieved with rest. Today's chest pain, however, was significantly worse than these episodes.  Hospital Course by problem list: # Chest Pain The pt p/w CP concerning for ACS given her risk factors and story.  She had a negative stress test and negative cath 5 and 4 years ago, respectively.  On admission, her EKG, CXR, and CE were unremarkable, and ACS was ruled out.  PE, aortic dissection, and esophageal pathology were not c/w labs and physical exam. The day following admission, a myoview was negative for stress induced ischemia.  The patient was d/c'ed in good condition  with hope for risk factor modification.  # DM The pt had not been taking her DM meds, and had an A1c of 15.  We d/c'ed her on lantus 30 units, which will probably need to be increased, novolog sliding scale, and metformin bid.  I have asked Tobey Bride to contact the pt to set up care.  She received some basic insulin teaching in the hospital from me and the nurses (and she had been on lantus before), but would definitely benefit from more teaching.  She was also rx'ed a meter with plenty of supplies.  # HTN The pt was on HCTZ/lisinopril and a beta blocker, and her pressure was under good control.  We continued these medicines.  # HL The pt's LDL was above goal, so we increased her dose of atorvastatin to 40mg  and will need to recheck at followup.  Discharge Vitals:  BP 137/88  Pulse 69  Temp(Src) 98.1 F (36.7 C) (Oral)  Resp 14  Ht 5\' 6"  (1.676 m)  Wt 187 lb 1.6 oz (84.868 kg)  BMI 30.20 kg/m2  SpO2 99%  LMP 03/03/2011  Discharge Labs:  Results for orders placed during the hospital encounter of 03/18/11 (from the past 24 hour(s))  GLUCOSE, CAPILLARY     Status: Abnormal   Collection Time   03/18/11  5:18 PM      Component Value Range   Glucose-Capillary 142  (*) 70 - 99 (mg/dL)   Comment 1 Notify RN    TSH     Status: Normal   Collection Time   03/18/11  6:41 PM      Component Value Range   TSH 0.466  0.350 - 4.500 (uIU/mL)  HEMOGLOBIN A1C     Status: Abnormal   Collection Time   03/18/11  6:41 PM      Component Value Range   Hemoglobin A1C 15.4 (*) <5.7 (%)   Mean Plasma Glucose 395 (*) <117 (mg/dL)  CARDIAC PANEL(CRET KIN+CKTOT+MB+TROPI)     Status: Normal   Collection Time   03/18/11  6:41 PM      Component Value Range   Total CK 50  7 - 177 (U/L)   CK, MB 1.4  0.3 - 4.0 (ng/mL)   Troponin I <0.30  <0.30 (ng/mL)   Relative Index RELATIVE INDEX IS INVALID  0.0 - 2.5   LIPID PANEL     Status: Abnormal   Collection Time   03/18/11  6:41 PM      Component Value Range   Cholesterol 253 (*) 0 - 200 (mg/dL)   Triglycerides 161 (*) <150 (mg/dL)   HDL 45  >09 (mg/dL)   Total CHOL/HDL Ratio 5.6     VLDL 34  0 - 40 (mg/dL)   LDL Cholesterol 604 (*) 0 - 99 (mg/dL)  GLUCOSE, CAPILLARY     Status: Abnormal   Collection Time   03/18/11  9:16 PM      Component Value Range   Glucose-Capillary 377 (*) 70 - 99 (mg/dL)  CARDIAC PANEL(CRET KIN+CKTOT+MB+TROPI)     Status: Normal   Collection Time   03/19/11  1:59 AM      Component Value Range   Total CK 42  7 - 177 (U/L)   CK, MB 1.4  0.3 - 4.0 (ng/mL)   Troponin I <0.30  <0.30 (ng/mL)   Relative Index RELATIVE INDEX IS INVALID  0.0 - 2.5   GLUCOSE, CAPILLARY     Status: Abnormal   Collection Time  03/19/11  8:01 AM      Component Value Range   Glucose-Capillary 299 (*) 70 - 99 (mg/dL)  CARDIAC PANEL(CRET KIN+CKTOT+MB+TROPI)     Status: Normal   Collection Time   03/19/11 12:57 PM      Component Value Range   Total CK 41  7 - 177 (U/L)   CK, MB 1.3  0.3 - 4.0 (ng/mL)   Troponin I <0.30  <0.30 (ng/mL)   Relative Index RELATIVE INDEX IS INVALID  0.0 - 2.5   GLUCOSE, CAPILLARY     Status: Abnormal   Collection Time   03/19/11  1:13 PM      Component Value Range   Glucose-Capillary 259 (*)  70 - 99 (mg/dL)    SignedAbner Greenspan, BEN 03/19/2011, 5:10 PM

## 2011-03-25 ENCOUNTER — Telehealth: Payer: Self-pay | Admitting: Dietician

## 2011-03-25 DIAGNOSIS — E1151 Type 2 diabetes mellitus with diabetic peripheral angiopathy without gangrene: Secondary | ICD-10-CM

## 2011-03-25 DIAGNOSIS — IMO0002 Reserved for concepts with insufficient information to code with codable children: Secondary | ICD-10-CM

## 2011-03-25 NOTE — Telephone Encounter (Signed)
Patient scheduled for Friday 04-01-11 at 9:30 AM per request of Dr. Abner Greenspan for DSMT and MNT as appropriate

## 2011-03-26 NOTE — Telephone Encounter (Signed)
Thanks Lupita Leash, sounds great.

## 2011-03-30 NOTE — Discharge Summary (Signed)
Internal Medicine Teaching Service Attending Note Date: 03/30/2011  Patient name: Jade Bond  Medical record number: [id number]  Date of birth: [birth year]    This patient has been seen and discussed with the house staff. Please see their note for complete details. I concur with their findings and discharge plan as outlined by Dr. Abner Greenspan.  Sumayya Muha

## 2011-04-01 ENCOUNTER — Ambulatory Visit (INDEPENDENT_AMBULATORY_CARE_PROVIDER_SITE_OTHER): Payer: 59 | Admitting: Dietician

## 2011-04-01 ENCOUNTER — Encounter: Payer: Self-pay | Admitting: Dietician

## 2011-04-01 ENCOUNTER — Ambulatory Visit (INDEPENDENT_AMBULATORY_CARE_PROVIDER_SITE_OTHER): Payer: 59 | Admitting: Internal Medicine

## 2011-04-01 ENCOUNTER — Encounter: Payer: Self-pay | Admitting: Internal Medicine

## 2011-04-01 VITALS — BP 126/88 | HR 67 | Temp 97.6°F | Ht 67.0 in | Wt 190.4 lb

## 2011-04-01 DIAGNOSIS — E1151 Type 2 diabetes mellitus with diabetic peripheral angiopathy without gangrene: Secondary | ICD-10-CM

## 2011-04-01 DIAGNOSIS — IMO0002 Reserved for concepts with insufficient information to code with codable children: Secondary | ICD-10-CM

## 2011-04-01 DIAGNOSIS — I10 Essential (primary) hypertension: Secondary | ICD-10-CM

## 2011-04-01 DIAGNOSIS — I798 Other disorders of arteries, arterioles and capillaries in diseases classified elsewhere: Secondary | ICD-10-CM

## 2011-04-01 DIAGNOSIS — R079 Chest pain, unspecified: Secondary | ICD-10-CM

## 2011-04-01 DIAGNOSIS — E1159 Type 2 diabetes mellitus with other circulatory complications: Secondary | ICD-10-CM

## 2011-04-01 DIAGNOSIS — E785 Hyperlipidemia, unspecified: Secondary | ICD-10-CM

## 2011-04-01 LAB — GLUCOSE, CAPILLARY: Glucose-Capillary: 151 mg/dL — ABNORMAL HIGH (ref 70–99)

## 2011-04-01 MED ORDER — INSULIN GLARGINE 100 UNIT/ML ~~LOC~~ SOLN: 30.0000 [IU] | Freq: Every day | SUBCUTANEOUS | Status: DC

## 2011-04-01 MED ORDER — LISINOPRIL-HYDROCHLOROTHIAZIDE 20-12.5 MG PO TABS: 1.0000 | ORAL_TABLET | Freq: Every day | ORAL | Status: DC

## 2011-04-01 NOTE — Assessment & Plan Note (Addendum)
Well controlled. -Will continue lisinopril-hctz 20/12.5mg  qd -Will continue Toprol XL 100mg  qd

## 2011-04-01 NOTE — Assessment & Plan Note (Signed)
Resolved

## 2011-04-01 NOTE — Assessment & Plan Note (Signed)
Not well controlled with LDL 174 on 03/18/11.  With her risk factors, her LDL goal should be less than 70. -Will continue Lipitor 40mg  qhs  -WIll continue Zetia -Plan to recheck her lipid in 3-6 months -Encourage diet and exercise -Continue ASA 81mg 

## 2011-04-01 NOTE — Patient Instructions (Signed)
Please make a follow up in 4 weeks.  Try to keep your carbohydrates consistent - most women try to eat between 3-4 servings at each meal and 1-2 servings at snacks.

## 2011-04-01 NOTE — Patient Instructions (Addendum)
-  Lantus 30 units at bedtime -Meal coverage with Novolog 2-3 units with each meal (use your sliding scale) -Continue Metformin 1000mg  bid -Diet & exercise- try to exercise at least 30 minutes 3 times per week -Check blood sugar at least 3 times daily  -Bring meter to next office visit -Continue taking your other medications as prescribed -Follow up with Dr. Anselm Jungling in 3 months

## 2011-04-01 NOTE — Assessment & Plan Note (Addendum)
Poorly controlled 2/2 medication noncompliance.  HbA1c on 03/18/2011 was 15.4.  CBG today 151.  I reviewed her meter today.. CBG average 194, ranging from 94-413.  Her higher levels were due to being out of her Lantus.  Foot exam showed good pulses and normal sensation.  -Continue Lantus 30 units at bed time -Meal coverage with Novolog 2-3 units with each meal depending on the sliding scale -Continue Metformin 1000mg  bid -Diet & exercise -Check blood sugar at least 3 times daily -will refer to opthalmology for diabetic eye exam  -Will check HbA1c in 3 months

## 2011-04-01 NOTE — Progress Notes (Signed)
Diabetes Self-Management Training (DSMT)  Initial Visit  04/01/2011 Jade Bond, identified by name and date of birth, is a 46 y.o. female with Type 2 Diabetes. Year of diabetes diagnosis:  Other persons present: no  ASSESSMENT Patient concerns are Nutrition/meal planning, Medication, Healthy Lifestyle and Glycemic control.  Last menstrual period 03/03/2011. There is no height or weight on file to calculate BMI. Lab Results  Component Value Date   LDLCALC 174* 03/18/2011   Lab Results  Component Value Date   HGBA1C 15.4* 03/18/2011   Monitor: accu chek aviva Labs reviewed.  DIABETES BUNDLE: A1C in past 6 months? Yes.  Less than 7%? No LDL in past year? Yes.  Less than 100 mg/dL? No Microalbumin ratio in past year? No. Patient taking ACE or ARB? Yes. Blood pressure less than 130/80? Yes. Foot exam in last year? Yes. Eye exam in past year? No.  Reminded patient to schedule eye exam. Tobacco use? No. Pneumovax? Yes Flu vaccine? Yes Asprin? Yes  Family history of diabetes: Yes- mother Support systems: parents- mother with whom she lives Special needs: None Prior DM Education: Yes- very little since having gestational diabetes 15-25 years ago Patients belief/attitude about diabetes: Diabetes can be controlled. Self foot exams daily: No Diabetes Complications: None   Medications See Medications list.  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 Was not taking her medicine as prescribed prior to recent admission. Eats healthy balanced meal for the most part and recently began eating more fruit.   INDIVIDUAL DIABETES EDUCATION PLAN:  Diabetes disease state Nutrition management Physical activity and exercise Medication Monitoring Acute complication: _______________________________________________________________________  Intervention TOPICS COVERED TODAY:  Diabetes disease state Definition of diabetes, type 1 and 2, and the diagnosis of diabetes. Factors that contribute to the development of diabetes. Explored patient's options for treatment of their diabetes. Nutrition management  Role of diet in the treatment of diabetes and the relationship between the three main macronutritents and blood glucose control. Meal timing in regards to the patients' current diabetes medication. consistent carbohydrates Medication  Reviewed patients medication for diabetes, action, purpose, timing of dose and side effects.  PATIENTS GOALS/PLAN (copy and paste in patient instructions so patient receives a copy): 1.  Learning Objective:       Understand importance of matching carbs to insulin 2.  Behavioral Objective:         Nutrition: To improve blood glucose control I will follow meal plan of consistent carbs Sometimes 25% Medications: To improve blood glucose levels, I will take my medication as prescribed Never 0%  Personalized Follow-Up Plan for Ongoing Self Management Support:  Doctor's Office, friends, family and CDE visits ______________________________________________________________________   Outcomes Expected outcomes: Demonstrated interest in learning.Expect positive changes in lifestyle. Self-care Barriers: None apparent today, question reason for not taking medicine as instructed prior to recent hospitalization Education material provided: yes Patient to contact team via Phone if problems or questions. Time in: 0940      Time out: 1020  Future  DSMT - 4-6 wks   Nghia Mcentee, Lupita Leash

## 2011-04-01 NOTE — Progress Notes (Signed)
HPI: Jade Bond is a 46 yo woman who moved from [place] to [place] since last April and has been taking her meds on a PRN basis with uncontrolled DM II (HbA1c 15.4), HTN, HLP who was recently admitted to the hospital on 2/15 for exertional chest pain and ACS was ruled out with negative cardiac enzymes as well as a negative myoview with EF 74%.  She presents today for hospital follow up.  She has been doing well since hospital discharge and has been compliant with her medications.  She has been taking Lantus 30 units at bed time but ran out her med about 3 days ago.  She also takes about 2-3 units of Novolog for meal time coverage with a sliding scale.  Also on Metformin 1000mg  bid.  She checks her blood sugar 3-4 times daily and fasting blood sugar usually in the low 100's.  She denies any hypoglycemic symptoms.  For her blood pressure, she has been taking Lisinopril/hctz 20/12.5mg  qd.  She was prescribed metoprol at discharge but she does not know if she has been taking it or not. No other complaints today.  Has not seen opthalmology in years.      ROS: as per HPI  PE: General: alert, well-developed, and cooperative to examination.  Neck: supple, full ROM, no thyromegaly, no JVD, and no carotid bruits.  Lungs: normal respiratory effort, no accessory muscle use, normal breath sounds, no crackles, and no wheezes. Heart: normal rate, regular rhythm, no murmur, no gallop, and no rub.  Abdomen: soft, non-tender, normal bowel sounds, no distention, no guarding, no rebound tenderness Msk: no joint swelling, no joint warmth, and no redness over joints.  Pulses: 2+ DP/PT pulses bilaterally Extremities: No cyanosis, clubbing, edema Neurologic: alert & oriented X3, cranial nerves II-XII intact, strength normal in all extremities, sensation intact to light touch, and gait normal.  Skin: turgor normal and no rashes.  Psych: appropriate

## 2011-05-03 ENCOUNTER — Ambulatory Visit (INDEPENDENT_AMBULATORY_CARE_PROVIDER_SITE_OTHER): Payer: 59 | Admitting: Internal Medicine

## 2011-05-03 ENCOUNTER — Other Ambulatory Visit: Payer: Self-pay | Admitting: Internal Medicine

## 2011-05-03 ENCOUNTER — Encounter: Payer: Self-pay | Admitting: Internal Medicine

## 2011-05-03 VITALS — BP 150/87 | HR 75 | Temp 97.9°F | Ht 67.0 in | Wt 189.5 lb

## 2011-05-03 DIAGNOSIS — IMO0002 Reserved for concepts with insufficient information to code with codable children: Secondary | ICD-10-CM

## 2011-05-03 DIAGNOSIS — I798 Other disorders of arteries, arterioles and capillaries in diseases classified elsewhere: Secondary | ICD-10-CM

## 2011-05-03 DIAGNOSIS — N179 Acute kidney failure, unspecified: Secondary | ICD-10-CM

## 2011-05-03 DIAGNOSIS — E1151 Type 2 diabetes mellitus with diabetic peripheral angiopathy without gangrene: Secondary | ICD-10-CM

## 2011-05-03 DIAGNOSIS — I10 Essential (primary) hypertension: Secondary | ICD-10-CM

## 2011-05-03 DIAGNOSIS — R42 Dizziness and giddiness: Secondary | ICD-10-CM | POA: Insufficient documentation

## 2011-05-03 DIAGNOSIS — E1159 Type 2 diabetes mellitus with other circulatory complications: Secondary | ICD-10-CM

## 2011-05-03 LAB — BASIC METABOLIC PANEL WITH GFR
BUN: 19 mg/dL (ref 6–23)
Calcium: 8.9 mg/dL (ref 8.4–10.5)
Chloride: 102 mEq/L (ref 96–112)
GFR, Est African American: 63 mL/min
GFR, Est Non African American: 55 mL/min — ABNORMAL LOW
Potassium: 4.4 mEq/L (ref 3.5–5.3)

## 2011-05-03 LAB — CBC
HCT: 34.3 % — ABNORMAL LOW (ref 36.0–46.0)
MCHC: 33.8 g/dL (ref 30.0–36.0)
Platelets: 315 10*3/uL (ref 150–400)
RDW: 13.5 % (ref 11.5–15.5)
WBC: 6.3 10*3/uL (ref 4.0–10.5)

## 2011-05-03 LAB — GLUCOSE, CAPILLARY: Glucose-Capillary: 258 mg/dL — ABNORMAL HIGH (ref 70–99)

## 2011-05-03 MED ORDER — LISINOPRIL 5 MG PO TABS: 5.0000 mg | ORAL_TABLET | Freq: Every day | ORAL | Status: DC

## 2011-05-03 NOTE — Progress Notes (Signed)
HPI: Jade Bond is a 46 yo woman who moved from [place] to [place] since last April and has been taking her meds on a PRN basis with uncontrolled DM II (HbA1c 15.4), HTN, HLP presents today for lightheaded since Friday, worse when she is up and moving.  She feels like she was going to pass out but did not pass out. The lightheadedness happens intermittently.   She had nausea but now resolved.  She states that she has not started taking lisinopril/hctz, she did not know that it was at the pharmacy.   She has not been eating a lot because she does not have appetite and not drinking a lot of fluids.   She had her tube tied x 17 years ago, no chance of being pregnant. Been on menstrual x 2 days, using 4 pads per day x 5 days usually.   Check sugars at home 2 times per day, usually range from 120-140 fasting, 200's in the PM.   She takes about 4 units of Novolog when it is above 200.  Denies any hypoglycemic episode.   She went to opthalmologist.   No other complaints today.  Denies any chest pain, SOB.     ROS: as per HPI  PE: General: alert, well-developed, and cooperative to examination.  Lungs: normal respiratory effort, no accessory muscle use, normal breath sounds, no crackles, and no wheezes. Heart: normal rate, regular rhythm, no murmur, no gallop, and no rub.  Abdomen: soft, non-tender, normal bowel sounds, no distention, no guarding, no rebound tenderness Msk: no joint swelling, no joint warmth, and no redness over joints.  Pulses: 2+ DP/PT pulses bilaterally Extremities: No cyanosis, clubbing, edema Neurologic: alert & oriented X3, cranial nerves II-XII intact, strength normal in all extremities, sensation intact to light touch, and gait normal.

## 2011-05-03 NOTE — Assessment & Plan Note (Addendum)
Adequately controlled.  Patient did not bring her meter in today. CBG 258 today.  Per her report, fasting blood sugar ranges in 120-140's and goes up as high as 200's after eating meal.  She denies any hypoglycemic episodes. -Will continue Lantus 30 units qhs -Continue Metformin 1000mg  po bid -Novolog meal time coverage- sliding scale (since patient does not eat regularly) -She went and saw opthalmologist already -Continue ACEi and statin

## 2011-05-03 NOTE — Assessment & Plan Note (Signed)
Well-controlled.   Patient has not even started taking her lisinopril/hctz.  Orthostatic vitals were normal except for the pulse: Lying BP 134/84 P 65 Sitting BP 136/90 P 74 Standing BP 142/92 P 85  -Will d/c lisinopril/hctz as she feels lightheaded and I do not want her to be on a diuretic which could worsen her lightheadedness. -Will continue Toprol XL 100mg  qd -Will check BMP and CBC today to make sure that her electrolytes are wnl since her last BMP 2 months ago showed Na of 130. -Will call patient back with results -I encouraged patient to keeps herself hydrated -I will see her back in 1 week if symptoms not improved -If her BP continues to be elevated in the future, will add Lisinopril to her regimen

## 2011-05-03 NOTE — Assessment & Plan Note (Signed)
Differential diagnosis include: volume loss since patient has been menstruating x2 days using 4 pads per day vs. diuretics which is unlikely because she has not started taking lisinopril/hctz since it was prescribed on 04/01/11, vs. Not eating or drinking much.    Orthostatic vitals were normal except for the pulse: Lying BP 134/84 P 65 Sitting BP 136/90 P 74 Standing BP 142/92 P 85  -Will d/c lisinopril/hctz as she feels lightheaded and I do not want her to be on a diuretic which could worsen her lightheadedness. -Will check stat BMP and CBC today to make sure that her electrolytes are wnl since her last BMP 2 months ago showed Na of 130. -Will call patient back with results -I encouraged patient to keeps herself hydrated -I will see her back in 1 week if symptoms not improved

## 2011-05-03 NOTE — Patient Instructions (Signed)
Make sure you eat 3 small meals per day Keep yourself hydrated Do not take Lisinopril/hydrochlorothiazide (blood pressure medication) Start taking Lisinopril 5mg  one tablet daily for your blood pressure Follow up with Dr. Anselm Jungling in 1 week if your lightheadedness do not resolve; otherwise, folllow up in 2 months Will check blood work today and I will call you with any abnormal results

## 2011-05-05 ENCOUNTER — Other Ambulatory Visit (INDEPENDENT_AMBULATORY_CARE_PROVIDER_SITE_OTHER): Payer: 59

## 2011-05-05 DIAGNOSIS — N179 Acute kidney failure, unspecified: Secondary | ICD-10-CM

## 2011-05-05 LAB — BASIC METABOLIC PANEL WITH GFR
CO2: 27 mEq/L (ref 19–32)
Calcium: 9.1 mg/dL (ref 8.4–10.5)
Chloride: 106 mEq/L (ref 96–112)
Creat: 1.34 mg/dL — ABNORMAL HIGH (ref 0.50–1.10)
GFR, Est Non African American: 48 mL/min — ABNORMAL LOW
Sodium: 138 mEq/L (ref 135–145)

## 2011-05-07 ENCOUNTER — Telehealth: Payer: Self-pay | Admitting: Internal Medicine

## 2011-05-07 ENCOUNTER — Emergency Department (HOSPITAL_COMMUNITY)
Admission: EM | Admit: 2011-05-07 | Discharge: 2011-05-08 | Disposition: A | Discharge status: Home / Self Care | Payer: 59 | Attending: Emergency Medicine | Admitting: Emergency Medicine

## 2011-05-07 ENCOUNTER — Encounter (HOSPITAL_COMMUNITY): Payer: Self-pay | Admitting: *Deleted

## 2011-05-07 DIAGNOSIS — E119 Type 2 diabetes mellitus without complications: Secondary | ICD-10-CM | POA: Insufficient documentation

## 2011-05-07 DIAGNOSIS — Z79899 Other long term (current) drug therapy: Secondary | ICD-10-CM | POA: Insufficient documentation

## 2011-05-07 DIAGNOSIS — R6889 Other general symptoms and signs: Secondary | ICD-10-CM | POA: Insufficient documentation

## 2011-05-07 DIAGNOSIS — R899 Unspecified abnormal finding in specimens from other organs, systems and tissues: Secondary | ICD-10-CM

## 2011-05-07 DIAGNOSIS — Z794 Long term (current) use of insulin: Secondary | ICD-10-CM | POA: Insufficient documentation

## 2011-05-07 DIAGNOSIS — Z7982 Long term (current) use of aspirin: Secondary | ICD-10-CM | POA: Insufficient documentation

## 2011-05-07 DIAGNOSIS — E785 Hyperlipidemia, unspecified: Secondary | ICD-10-CM | POA: Insufficient documentation

## 2011-05-07 DIAGNOSIS — I10 Essential (primary) hypertension: Secondary | ICD-10-CM | POA: Insufficient documentation

## 2011-05-07 LAB — COMPREHENSIVE METABOLIC PANEL
ALT: 8 U/L (ref 0–35)
AST: 10 U/L (ref 0–37)
Alkaline Phosphatase: 86 U/L (ref 39–117)
CO2: 26 mEq/L (ref 19–32)
Chloride: 101 mEq/L (ref 96–112)
GFR calc Af Amer: 60 mL/min — ABNORMAL LOW (ref >90)
GFR calc non Af Amer: 52 mL/min — ABNORMAL LOW (ref >90)
Glucose, Bld: 195 mg/dL — ABNORMAL HIGH (ref 70–99)
Potassium: 4.2 mEq/L (ref 3.5–5.1)
Sodium: 136 mEq/L (ref 135–145)
Total Bilirubin: 0.1 mg/dL — ABNORMAL LOW (ref 0.3–1.2)

## 2011-05-07 LAB — DIFFERENTIAL
Basophils Absolute: 0 10*3/uL (ref 0.0–0.1)
Lymphocytes Relative: 40 % (ref 12–46)
Lymphs Abs: 2.8 10*3/uL (ref 0.7–4.0)
Neutro Abs: 3.7 10*3/uL (ref 1.7–7.7)

## 2011-05-07 LAB — CBC
Platelets: 302 10*3/uL (ref 150–400)
RBC: 3.67 MIL/uL — ABNORMAL LOW (ref 3.87–5.11)
RDW: 13.4 % (ref 11.5–15.5)
WBC: 7 10*3/uL (ref 4.0–10.5)

## 2011-05-07 NOTE — ED Notes (Signed)
Pt reports her PCp called her today and requested pt be evaluated in ED d/t abnormal kidney function levels and potassium levels in her recent blood work. Pt denies any symptoms at present, in no acute distress. PO fluids provided per Santina Evans, [place].

## 2011-05-07 NOTE — Telephone Encounter (Addendum)
  INTERNAL MEDICINE RESIDENCY PROGRAM After-Hours Telephone Call    Reason for call:   I placed an outgoing call to [name] regarding recent abnormal labs - specifically, BMET on 05/05/2011 shows progressively worsening renal function (SCr 1.34 from 1.2 on 04/02), as well as hyperkalemia (K 5.8).  Currently, the patient denies chest pain, palpitations, difficulty breathing, shortness of breath, muscle aches or pains, nausea, vomiting, abdominal pain, fevers, chills, hematuria, dysuria.  Patient states that blood sugars have recently been well controlled - this AM preprandial to breakfast < 120 mg/dL.  Patient has appropriately not been taking the metformin, lisinopril-hctz as instructed by Dr. Anselm Jungling (she was told to stop these medications).  No recent increased or excessive use of NSAIDs.    Last clinic/ hospital encounter:   05/03/2011 - Last seen in Clinic by Dr. Anselm Jungling for evaluation of 4-5 day history of intermittent lightheadedness, decreased oral intake. Etiology of lightheadedness was not clear, thought possibly 2/2 volume loss in setting of menstrual cycle and decreased oral intake. Labs were obtained on that day showing AKI with SCr of 1.2. The patient was instructed to stop metformin, and return to clinic on 05/05/2011 for repeat labs (which are as described above).  Basic Metabolic Panel:    Component Value Date/Time   NA 138 05/05/2011 0959   K 5.8* 05/05/2011 0959   CL 106 05/05/2011 0959   CO2 27 05/05/2011 0959   BUN 21 05/05/2011 0959   CREATININE 1.34* 05/05/2011 0959   CREATININE 1.05 03/18/2011 1230   GLUCOSE 163* 05/05/2011 0959   CALCIUM 9.1 05/05/2011 0959      Assessment/ Plan:   Patient's AKI is of unclear etiology at this point - possible volume depletion as indicated by recent decreased oral intake, blood loss via her menstrual cycle. No other clear indication of rhabdo, medication effect.   Given progressively worsening renal function and hyperkalemia in  addition to the fact that clinic is closed over the weekend, would err on the side of caution.  Therefore, pt is advised to come to the ER to allow for repeat BMET and EKG, consider also to check UA to evaluate for rhabdo (although no clear reason for this to be the case).  Patient expresses understanding and is agreeable to present to the ER.     Johnette Abraham, D.O.  05/07/2011, 8:18 PM

## 2011-05-07 NOTE — ED Notes (Signed)
Patient is unable to void at present. 

## 2011-05-07 NOTE — ED Notes (Signed)
The pt had lab work Thursday and today she was called and told she had abnormal labs and needed to come to the ed

## 2011-05-08 LAB — URINALYSIS, ROUTINE W REFLEX MICROSCOPIC
Bilirubin Urine: NEGATIVE
Ketones, ur: NEGATIVE mg/dL
Protein, ur: 100 mg/dL — AB
Specific Gravity, Urine: 1.012 (ref 1.005–1.030)
Urobilinogen, UA: 0.2 mg/dL (ref 0.0–1.0)

## 2011-05-08 LAB — URINE MICROSCOPIC-ADD ON

## 2011-05-08 NOTE — ED Notes (Signed)
D/c instructions reviewed w/ pt - pt denies any further questions or concerns at present.   

## 2011-05-08 NOTE — Discharge Instructions (Signed)
Your labs here did not show an elevated potassium. Your creatinine, which measures how well your kidneys are working, was consistent with the value on Tuesday. Please plan to call Dr. Anselm Jungling on Monday to discuss the results. She should receive a copy of these. If you're having chest pain, shortness of breath, or any other concerns, please return to the ED for further evaluation.  RESOURCE GUIDE  Dental Problems  Patients with Medicaid: [facility] [phone]                                           [phone] Phone:  [phone]                                                  Phone:  [phone]  If unable to pay or uninsured, contact:  [facility] or [facility]. to become qualified for the adult dental clinic.  Chronic Pain Problems Contact [facility]  [phone] Patients need to be referred by their primary care doctor.  Insufficient Money for Medicine Contact [employer]:  call "211" or [facility] (250)806-3994.  No Primary Care Doctor Call [facility]  [phone] Other agencies that provide inexpensive medical care    [facility]  [phone]    [facility]  [phone]    [facility]  [phone]    [facility]  [phone]    [facility]  [phone]    [facility]  [phone]  Psychological Services [facility]  [phone] [facility]  [phone] [facility]   [phone] (emergency services [phone])  Substance Abuse Resources [facility]  [phone] [facility] [phone] [facility] [phone] [facility] Fl[facility] [phone] Residential & Outpatient Substance Abuse Program  [phone]  Abuse/Neglect [facility] Child Abuse Hotline 509-818-1603 [facility] Child Abuse Hotline (765) 052-7568 (After Hours)  Emergency Shelter [facility] (931)693-5770  M[facility] [phone] [facility] [phone]  MRSA Hotline #:   443 702 8012    [facility] Resources  [facility] [place]     [employer]                          [facility] [address] [place]                       [address]      [address]  [place]                                                Cristobal Goldmann Phone:  [phone]                                   Phone:  [phone]                 Phone:  [phone]  [facility] Mental Health Phone:  [phone]  [facility] Child Abuse Hotline 951-650-5913 [phone] (After Hours)

## 2011-05-08 NOTE — ED Provider Notes (Signed)
History     CSN: [id number]  Arrival date & time 05/07/11  2103   First MD Initiated Contact with Patient 05/07/11 2349      Chief Complaint  Patient presents with  . abn blood work     (Consider location/radiation/quality/duration/timing/severity/associated sxs/prior treatment) HPI Hx from pt and previous chart. 46yo F presents at her physician's request. She apparently was not feeling well and went to her PCP at [facility] on Tuesday. Bloodwork was drawn that showed a bump in her creatinine - she has no known kidney disease but is diabetic. She was taken off metformin and bloodwork repeated on Thurs. Apparently the labs were reviewed this evening which showed that her Cr was consistent with previous but she was hyperkalemic to 5.8. She was instructed to present for a redraw. Pt has been feeling well with no systemic complaints. Denies CP, SOB, diaphoresis, n/v/d, urinary sx, dizziness, HA, URI sx.  Past Medical History  Diagnosis Date  . Hypertension   . Hyperlipidemia   . Angina   . Diabetes mellitus     Past Surgical History  Procedure Date  . Tonsillectomy 1980  . Cardiac catheterization ~ 2008    negative, no intervention needed  . Tubal ligation 1995    Family History  Problem Relation Age of Onset  . Malignant hyperthermia Mother     History  Substance Use Topics  . Smoking status: Never Smoker   . Smokeless tobacco: Not on file  . Alcohol Use: No    OB History    Grav Para Term Preterm Abortions TAB SAB Ect Mult Living                  Review of Systems as per HPI  Allergies  Review of patient's allergies indicates no known allergies.  Home Medications   Current Outpatient Rx  Name Route Sig Dispense Refill  . ASPIRIN 81 MG PO TABS Oral Take 81 mg by mouth daily.    . ATORVASTATIN CALCIUM 10 MG PO TABS Oral Take 10 mg by mouth daily.    . INSULIN ASPART 100 UNIT/ML [place] SOLN Subcutaneous Inject 0-8 Units into the skin 3 (three) times daily before meals.  Check blood sugar before each meal and use sliding scale to give insulin as needed.    . INSULIN GLARGINE 100 UNIT/ML [place] SOLN Subcutaneous Inject 30 Units into the skin at bedtime.    Marland Kitchen METOPROLOL SUCCINATE ER 100 MG PO TB24 Oral Take 100 mg by mouth daily. Take with or immediately following a meal.    . NITROGLYCERIN 0.4 MG SL SUBL Sublingual Place 0.4 mg under the tongue every 5 (five) minutes as needed. For chest pain    . ATORVASTATIN CALCIUM 20 MG PO TABS Oral Take 20 mg by mouth daily.    Marland Kitchen ACCU-CHEK AVIVA PLUS W/DEVICE KIT Does not apply 1 each by Does not apply route 4 (four) times daily -  before meals and at bedtime. 1 kit 0  . GLUCOSE BLOOD VI STRP  Use as instructed 200 each 12  . INSULIN PEN NEEDLE 31G X 5 MM MISC  Inject subcuteaneously into skin 200 each 5    BP 135/98  Pulse 81  Temp(Src) 98.4 F (36.9 C) (Oral)  Resp 20  SpO2 98%  LMP 05/06/2011  Physical Exam  Nursing note and vitals reviewed. Constitutional: She appears well-developed and well-nourished. No distress.  HENT:  Head: Normocephalic and atraumatic.  Mouth/Throat: No oropharyngeal exudate.  Eyes: EOM are  normal. Pupils are equal, round, and reactive to light.  Neck: Normal range of motion. Neck supple.  Cardiovascular: Normal rate, regular rhythm and normal heart sounds.   Pulmonary/Chest: Effort normal and breath sounds normal.  Abdominal: Soft. Bowel sounds are normal. There is no tenderness. There is no rebound.  Musculoskeletal: Normal range of motion.  Lymphadenopathy:    She has no cervical adenopathy.  Neurological: She is alert.  Skin: Skin is warm and dry. She is not diaphoretic.  Psychiatric: She has a normal mood and affect.    ED Course  Procedures (including critical care time)  Labs Reviewed  CBC - Abnormal; Notable for the following:    RBC 3.67 (*)    Hemoglobin 11.4 (*)    HCT 33.6 (*)    All other components within normal limits  COMPREHENSIVE METABOLIC PANEL - Abnormal;  Notable for the following:    Glucose, Bld 195 (*)    BUN 25 (*)    Creatinine, Ser 1.25 (*)    Albumin 3.3 (*)    Total Bilirubin 0.1 (*)    GFR calc non Af Amer 52 (*)    GFR calc Af Amer 60 (*)    All other components within normal limits  URINALYSIS, ROUTINE W REFLEX MICROSCOPIC - Abnormal; Notable for the following:    Hgb urine dipstick TRACE (*)    Protein, ur 100 (*)    Leukocytes, UA SMALL (*)    All other components within normal limits  URINE MICROSCOPIC-ADD ON - Abnormal; Notable for the following:    Squamous Epithelial / LPF FEW (*)    All other components within normal limits  DIFFERENTIAL  LAB REPORT - SCANNED   No results found.   1. Abnormal laboratory test       MDM  Labs repeated - K in nl range. Cr consistent with previous, not increased. 100 protein in urine noted. Feel pt stable at this time with results c/w previous. I did discuss with Dr. Jeraldine Loots, attending MD who agrees. Pt to f/u with [facility] on Mon. Return precautions discussed.        Grant Fontana, [place] 05/11/11 [phone]

## 2011-05-09 ENCOUNTER — Telehealth: Payer: Self-pay | Admitting: *Deleted

## 2011-05-09 NOTE — Telephone Encounter (Signed)
Pt calls to f/u on labs she was called to come to ED for, she would like to speak to md to discuss these values and explain concerns

## 2011-05-09 NOTE — Telephone Encounter (Signed)
I called the patient back, who just wanted to review her labs drawn at the ED. We reviewed the labs together, noting that her potassium levels were normal. As well, her renal function is mildly improved. We will plan to see her back in the clinic in 2 weeks to recheck BMET.  Jade Bond, D.O.

## 2011-05-11 NOTE — ED Provider Notes (Signed)
Medical screening examination/treatment/procedure(s) were performed by non-physician practitioner and as supervising physician I was immediately available for consultation/collaboration.  Torin Whisner, MD 05/11/11 1833 

## 2011-05-17 ENCOUNTER — Ambulatory Visit (INDEPENDENT_AMBULATORY_CARE_PROVIDER_SITE_OTHER): Payer: 59 | Admitting: Internal Medicine

## 2011-05-17 ENCOUNTER — Encounter: Payer: Self-pay | Admitting: Internal Medicine

## 2011-05-17 VITALS — BP 136/90 | HR 81 | Temp 99.5°F | Wt 193.3 lb

## 2011-05-17 DIAGNOSIS — I798 Other disorders of arteries, arterioles and capillaries in diseases classified elsewhere: Secondary | ICD-10-CM

## 2011-05-17 DIAGNOSIS — IMO0002 Reserved for concepts with insufficient information to code with codable children: Secondary | ICD-10-CM

## 2011-05-17 DIAGNOSIS — E1159 Type 2 diabetes mellitus with other circulatory complications: Secondary | ICD-10-CM

## 2011-05-17 DIAGNOSIS — E1151 Type 2 diabetes mellitus with diabetic peripheral angiopathy without gangrene: Secondary | ICD-10-CM

## 2011-05-17 DIAGNOSIS — N179 Acute kidney failure, unspecified: Secondary | ICD-10-CM

## 2011-05-17 DIAGNOSIS — J069 Acute upper respiratory infection, unspecified: Secondary | ICD-10-CM

## 2011-05-17 DIAGNOSIS — A599 Trichomoniasis, unspecified: Secondary | ICD-10-CM | POA: Insufficient documentation

## 2011-05-17 DIAGNOSIS — N185 Chronic kidney disease, stage 5: Secondary | ICD-10-CM | POA: Insufficient documentation

## 2011-05-17 DIAGNOSIS — I10 Essential (primary) hypertension: Secondary | ICD-10-CM

## 2011-05-17 LAB — BASIC METABOLIC PANEL WITH GFR
CO2: 27 mEq/L (ref 19–32)
Calcium: 9.6 mg/dL (ref 8.4–10.5)
Potassium: 4.7 mEq/L (ref 3.5–5.3)
Sodium: 137 mEq/L (ref 135–145)

## 2011-05-17 LAB — GLUCOSE, CAPILLARY: Glucose-Capillary: 152 mg/dL — ABNORMAL HIGH (ref 70–99)

## 2011-05-17 MED ORDER — METOPROLOL SUCCINATE ER 100 MG PO TB24
100.0000 mg | ORAL_TABLET | Freq: Every day | ORAL | Status: DC
Start: 2011-05-17 — End: 2011-11-24

## 2011-05-17 MED ORDER — INSULIN GLARGINE 100 UNIT/ML ~~LOC~~ SOLN: 30.0000 [IU] | Freq: Every day | SUBCUTANEOUS | Status: DC

## 2011-05-17 MED ORDER — INSULIN ASPART 100 UNIT/ML ~~LOC~~ SOLN: 0.0000 [IU] | Freq: Three times a day (TID) | SUBCUTANEOUS | Status: DC

## 2011-05-17 MED ORDER — METRONIDAZOLE 500 MG PO TABS: 500.0000 mg | ORAL_TABLET | Freq: Two times a day (BID) | ORAL | Status: AC

## 2011-05-17 MED ORDER — ATORVASTATIN CALCIUM 10 MG PO TABS: 10.0000 mg | ORAL_TABLET | Freq: Every day | ORAL | Status: DC

## 2011-05-17 NOTE — Assessment & Plan Note (Signed)
Likely viral.  No signs of bacterial infection.  Patient is afebrile, no productive sputum.  Lung exam clear to auscultation.   -Will treat symptomatically -Advised patient to keep good oral hydration

## 2011-05-17 NOTE — Assessment & Plan Note (Addendum)
Improving, CBG today 150's.  She denies any hypoglycemic episodes. She did not bring her meter in today. Will continue current regimen at this time.  I encouraged to eat 3 small meals per day even though she states that her appetite is poor.   -Continue Lantus 30 units qhs -Meal time coverage with Novolog -Continue to hold Metformin for now given her recent AKI -Check blood sugar at least 2-3 times daily -Continue asa, statin -Check HbA1c in May

## 2011-05-17 NOTE — Progress Notes (Signed)
HPI: Jade Bond is a 46 yo woman who moved from [place] to [place] since last April and has been taking her meds on a PRN basis with uncontrolled DM II (HbA1c 15.4), HTN, HLP presents today for follow up.  She reports having Dry cough x 1 day.  No appetite, but she tries to eat (had a bowl of oatmeal today).  She has generalized weakness and aching all over her body.  She has some soreness around her eyes but denies any maxillary or frontal tenderness, no nasal drainage, +headache. + sneezing, +watery eyes. No fever, no chills, N/V. On Friday, stomach felt queezy,3  loose stools but now resolved. Fasting blood sugar: 186 today, no hypoglycemia.  She has been taking 30 units of Lantus daily.  ROS: as per HPI  PE: General: alert, well-developed, and cooperative to examination.  Face: No maxillary or frontal tenderness.  No purulent nasal drainage.  No oral mucosa erythema or exudates.   Lungs: normal respiratory effort, no accessory muscle use, normal breath sounds, no crackles, and no wheezes. Heart: normal rate, regular rhythm, no murmur, no gallop, and no rub.  Abdomen: soft, non-tender, normal bowel sounds, no distention, no guarding, no rebound tenderness Msk: no joint swelling, no joint warmth, and no redness over joints.  Pulses: 2+ DP/PT pulses bilaterally Extremities: No cyanosis, clubbing, edema Neurologic: alert & oriented X3, cranial nerves II-XII intact, strength normal in all extremities, sensation intact to light touch, and gait normal.  Psych: Oriented X3, memory intact for recent and remote, normally interactive, good eye contact, flat affect

## 2011-05-17 NOTE — Assessment & Plan Note (Signed)
Adequately controlled. BP 136/90.   -Will continue Metoprolol 100mg  qd -Will not add ACEi at this time as her Cr is sliglhtly elevated which is likely due to NSAIDs (today, she reports using NSAIDs about 2 weeks ago for her menstrual cramps)

## 2011-05-17 NOTE — Patient Instructions (Addendum)
Make sure you keep yourself well-hydrated Take Flagyl 500mg  one tablet twice daily x 7 days, make sure your partner takes the medication as well  Get lab work today and I will call you with any abnormal lab results Follow up with Dr. Anselm Jungling in 2-3 months, sooner if needed Can take over the counter Claritin/Zyrtec/allegra for allergy symptoms

## 2011-05-17 NOTE — Assessment & Plan Note (Signed)
Seen on urinalysis on 05/07/11.  She is completely asymptomatic, denies any discharge or discomfort. Rx with Flagyl 500mg  bid x 7 days Rx also given for patient's partner as well

## 2011-05-17 NOTE — Assessment & Plan Note (Signed)
Likely due to poor oral hydration as well as now reporting having used NSAIDs about 2 weeks ago for her menstrual cramp.  When she returned to the ED for blood work, her Cr was actually trending down.  -Will repeat BMP today -Avoid NSAIDs

## 2011-05-19 ENCOUNTER — Ambulatory Visit (INDEPENDENT_AMBULATORY_CARE_PROVIDER_SITE_OTHER): Payer: 59 | Admitting: Internal Medicine

## 2011-05-19 ENCOUNTER — Encounter: Payer: Self-pay | Admitting: Internal Medicine

## 2011-05-19 VITALS — BP 121/79 | HR 84 | Temp 98.0°F | Ht 67.0 in | Wt 189.9 lb

## 2011-05-19 DIAGNOSIS — I10 Essential (primary) hypertension: Secondary | ICD-10-CM

## 2011-05-19 DIAGNOSIS — IMO0002 Reserved for concepts with insufficient information to code with codable children: Secondary | ICD-10-CM

## 2011-05-19 DIAGNOSIS — J069 Acute upper respiratory infection, unspecified: Secondary | ICD-10-CM

## 2011-05-19 DIAGNOSIS — N179 Acute kidney failure, unspecified: Secondary | ICD-10-CM

## 2011-05-19 DIAGNOSIS — E1159 Type 2 diabetes mellitus with other circulatory complications: Secondary | ICD-10-CM

## 2011-05-19 DIAGNOSIS — E1165 Type 2 diabetes mellitus with hyperglycemia: Secondary | ICD-10-CM

## 2011-05-19 DIAGNOSIS — I798 Other disorders of arteries, arterioles and capillaries in diseases classified elsewhere: Secondary | ICD-10-CM

## 2011-05-19 NOTE — Patient Instructions (Signed)
1. Follow up as scheduled. 2. Maintain hydration, drink plenty of fluid    Use Cool mist Humidifier at night    Take Tylenol 650 mg po every 4-6 hours as needed for your body aching    Take robitussin OTC as needed.

## 2011-05-21 NOTE — Assessment & Plan Note (Signed)
BP 121/79.  - continue current regimen - patient has had elevated Cr level, will defer to PCP for further evaluation on ACEI.  - follow up with PCP in one month

## 2011-05-21 NOTE — Assessment & Plan Note (Signed)
BG 128 Follow up with PCP in one month

## 2011-05-21 NOTE — Assessment & Plan Note (Signed)
Cr. 1.17 on 05/17/11. Better than before.   Follow up in one month

## 2011-05-21 NOTE — Assessment & Plan Note (Addendum)
The clinical manifestation is consistent with viral URI.   - reassurance given to patient  - continue symptomatic Tx.    Maintain hydration, drink plenty of fluid    Use Cool mist Humidifier at night    Take Tylenol 650 mg po every 4-6 hours as needed for your body aching    Take robitussin OTC as needed.

## 2011-05-21 NOTE — Progress Notes (Signed)
Patient ID: Cain Sieve, female   DOB: 1965-10-12, 46 y.o.   MRN: [id number]  Subjective:   Patient ID: Donda Friedli female   DOB: 1965/11/01 46 y.o.   MRN: [id number]  HPI: Ms.Geraldyne Tynes-Istre is a 46 y.o.  woman who moved from [place] to [place] since last April and has been taking her meds on a PRN basis with uncontrolled DM II (HbA1c 15.4), HTN, HLP presents today for follow up since last office visit two days ago.    She reports that she started to have dry cough, generalized weakness and aching all over her body 3 days ago. She came to our clinic and was evaluated by Dr. Anselm Jungling who told her that she had viral URI.  She went home and felt that her above symptoms were not better. She is here today for further evaluation.  Denies fever, chills, headache or sore throat. Denies nasal drainage, sneezing or post nasal drip.    No shortness of breath or dyspnea on exertion. No chest pain, chest pressure or palpitation No nausea, vomiting, or abdominal pain. No melena, diarrhea or incontinence. No muscle weakness.  Denies depression. No appetite or weight changes.     Past Medical History  Diagnosis Date  . Hypertension   . Hyperlipidemia   . Angina   . Diabetes mellitus    Current Outpatient Prescriptions  Medication Sig Dispense Refill  . aspirin 81 MG tablet Take 81 mg by mouth daily.      Marland Kitchen atorvastatin (LIPITOR) 10 MG tablet Take 1 tablet (10 mg total) by mouth daily.  31 tablet  11  . Blood Glucose Monitoring Suppl (ACCU-CHEK AVIVA PLUS) W/DEVICE KIT 1 each by Does not apply route 4 (four) times daily -  before meals and at bedtime.  1 kit  0  . glucose blood (ACCU-CHEK AVIVA) test strip Use as instructed  200 each  12  . insulin aspart (NOVOLOG) 100 UNIT/ML injection Inject 0-8 Units into the skin 3 (three) times daily before meals. Check blood sugar before each meal and use sliding scale to give insulin as needed.  1 vial  11  . insulin glargine (LANTUS) 100 UNIT/ML injection  Inject 30 Units into the skin at bedtime.  10 mL  11  . Insulin Pen Needle 31G X 5 MM MISC Inject subcuteaneously into skin  200 each  5  . metoprolol succinate (TOPROL-XL) 100 MG 24 hr tablet Take 1 tablet (100 mg total) by mouth daily. Take with or immediately following a meal.  31 tablet  11  . metroNIDAZOLE (FLAGYL) 500 MG tablet Take 1 tablet (500 mg total) by mouth 2 (two) times daily.  14 tablet  0  . nitroGLYCERIN (NITROSTAT) 0.4 MG SL tablet Place 0.4 mg under the tongue every 5 (five) minutes as needed. For chest pain       Family History  Problem Relation Age of Onset  . Malignant hyperthermia Mother    History   Social History  . Marital Status: Legally Separated    Spouse Name: N/A    Number of Children: N/A  . Years of Education: N/A   Social History Main Topics  . Smoking status: Never Smoker   . Smokeless tobacco: None  . Alcohol Use: No  . Drug Use: No  . Sexually Active: Yes   Other Topics Concern  . None   Social History Narrative  . None   Review of Systems: See HPI  Objective:  Physical Exam: Filed Vitals:  05/19/11 1606  BP: 121/79  Pulse: 84  Temp: 98 F (36.7 C)  TempSrc: Oral  Height: 5\' 7"  (1.702 m)  Weight: 189 lb 14.4 oz (86.138 kg)  SpO2: 100%   General: alert, well-developed, and cooperative to examination.  Face: No maxillary or frontal tenderness.  No purulent nasal drainage.  No oral mucosa erythema or exudates.   Lungs: normal respiratory effort, no accessory muscle use, normal breath sounds, no crackles, and no wheezes. Heart: normal rate, regular rhythm, no murmur, no gallop, and no rub.  Abdomen: soft, non-tender, normal bowel sounds, no distention, no guarding, no rebound tenderness Msk: no joint swelling, no joint warmth, and no redness over joints.  Pulses: 2+ DP/PT pulses bilaterally Extremities: No cyanosis, clubbing, edema Neurologic: alert & oriented X3, cranial nerves II-XII intact, strength normal in all  extremities, sensation intact to light touch, and gait normal.  Psych: Oriented X3, memory intact for recent and remote, normally interactive, good eye contact.  Assessment & Plan:

## 2011-06-22 ENCOUNTER — Encounter: Payer: 59 | Admitting: Dietician

## 2011-08-16 ENCOUNTER — Emergency Department (HOSPITAL_COMMUNITY)
Admission: EM | Admit: 2011-08-16 | Discharge: 2011-08-16 | Disposition: A | Discharge status: Home / Self Care | Payer: Self-pay | Attending: Emergency Medicine | Admitting: Emergency Medicine

## 2011-08-16 ENCOUNTER — Encounter (HOSPITAL_COMMUNITY): Payer: Self-pay

## 2011-08-16 DIAGNOSIS — Z794 Long term (current) use of insulin: Secondary | ICD-10-CM | POA: Insufficient documentation

## 2011-08-16 DIAGNOSIS — I10 Essential (primary) hypertension: Secondary | ICD-10-CM | POA: Insufficient documentation

## 2011-08-16 DIAGNOSIS — R739 Hyperglycemia, unspecified: Secondary | ICD-10-CM

## 2011-08-16 DIAGNOSIS — E119 Type 2 diabetes mellitus without complications: Secondary | ICD-10-CM | POA: Insufficient documentation

## 2011-08-16 LAB — COMPREHENSIVE METABOLIC PANEL
ALT: 8 U/L (ref 0–35)
AST: 10 U/L (ref 0–37)
Alkaline Phosphatase: 86 U/L (ref 39–117)
CO2: 24 mEq/L (ref 19–32)
Chloride: 95 mEq/L — ABNORMAL LOW (ref 96–112)
GFR calc non Af Amer: 45 mL/min — ABNORMAL LOW (ref >90)
Sodium: 128 mEq/L — ABNORMAL LOW (ref 135–145)
Total Bilirubin: 0.2 mg/dL — ABNORMAL LOW (ref 0.3–1.2)

## 2011-08-16 LAB — GLUCOSE, CAPILLARY: Glucose-Capillary: 228 mg/dL — ABNORMAL HIGH (ref 70–99)

## 2011-08-16 LAB — URINALYSIS, ROUTINE W REFLEX MICROSCOPIC
Bilirubin Urine: NEGATIVE
Protein, ur: 100 mg/dL — AB
Urobilinogen, UA: 0.2 mg/dL (ref 0.0–1.0)

## 2011-08-16 LAB — CBC WITH DIFFERENTIAL/PLATELET
Basophils Absolute: 0 10*3/uL (ref 0.0–0.1)
HCT: 37.8 % (ref 36.0–46.0)
Lymphocytes Relative: 41 % (ref 12–46)
Neutro Abs: 2.9 10*3/uL (ref 1.7–7.7)
Platelets: 288 10*3/uL (ref 150–400)
RBC: 4.29 MIL/uL (ref 3.87–5.11)
RDW: 12.3 % (ref 11.5–15.5)
WBC: 5.6 10*3/uL (ref 4.0–10.5)

## 2011-08-16 LAB — URINE MICROSCOPIC-ADD ON

## 2011-08-16 MED ORDER — SODIUM CHLORIDE 0.9 % IV BOLUS (SEPSIS)
1000.0000 mL | Freq: Once | INTRAVENOUS | Status: AC
  Administered 2011-08-16: 1000 mL via INTRAVENOUS

## 2011-08-16 MED ORDER — INSULIN ASPART 100 UNIT/ML ~~LOC~~ SOLN
8.0000 [IU] | Freq: Once | SUBCUTANEOUS | Status: AC
  Administered 2011-08-16: 8 [IU] via SUBCUTANEOUS

## 2011-08-16 NOTE — ED Notes (Signed)
CBG 374 

## 2011-08-16 NOTE — ED Notes (Signed)
PT. WAITING FOR HER MEDICATIONS FROM [facility].

## 2011-08-16 NOTE — ED Notes (Signed)
CASE MANAGER  ( LANETTE GAINES) SPEAKING WITH PT. REGARDING HER MEDICATIONS .

## 2011-08-16 NOTE — ED Provider Notes (Addendum)
History     CSN: [id number]  Arrival date & time 08/16/11  1307   First MD Initiated Contact with Patient 08/16/11 1609      Chief Complaint  Patient presents with  . Blurred Vision  . Fatigue  . Polydipsia    (Consider location/radiation/quality/duration/timing/severity/associated sxs/prior treatment) The history is provided by the patient.  pt w hx iddm, c/o polyuria and polydipsia, and states in past day or two vision seemed mildly diffusely blurry. States same symptoms when her blood sugar has been high in past. States out of her oral diabetic med in past month. Still taking insulin as directed, not following any specific diabetic diet. No vomiting or diarrhea. No abd pain. No dysuria. No cough or uri c/o. No fever or chills. Denies faintness or lightheadedness.     Past Medical History  Diagnosis Date  . Hypertension   . Hyperlipidemia   . Angina   . Diabetes mellitus     Past Surgical History  Procedure Date  . Tonsillectomy 1980  . Cardiac catheterization ~ 2008    negative, no intervention needed  . Tubal ligation 1995    Family History  Problem Relation Age of Onset  . Malignant hyperthermia Mother     History  Substance Use Topics  . Smoking status: Never Smoker   . Smokeless tobacco: Not on file  . Alcohol Use: No    OB History    Grav Para Term Preterm Abortions TAB SAB Ect Mult Living                  Review of Systems  Constitutional: Negative for fever and chills.  HENT: Negative for sore throat and neck pain.   Eyes: Negative for redness.  Respiratory: Negative for shortness of breath.   Cardiovascular: Negative for chest pain.  Gastrointestinal: Negative for vomiting, abdominal pain and diarrhea.  Genitourinary: Negative for dysuria and flank pain.  Musculoskeletal: Negative for back pain.  Skin: Negative for rash.  Neurological: Negative for headaches.  Hematological: Does not bruise/bleed easily.  Psychiatric/Behavioral: Negative  for confusion.    Allergies  Review of patient's allergies indicates no known allergies.  Home Medications   Current Outpatient Rx  Name Route Sig Dispense Refill  . ASPIRIN 81 MG PO TABS Oral Take 81 mg by mouth daily.    . ATORVASTATIN CALCIUM 10 MG PO TABS Oral Take 1 tablet (10 mg total) by mouth daily. 31 tablet 11  . ACCU-CHEK AVIVA PLUS W/DEVICE KIT Does not apply 1 each by Does not apply route 4 (four) times daily -  before meals and at bedtime. 1 kit 0  . GLUCOSE BLOOD VI STRP  Use as instructed 200 each 12  . INSULIN ASPART 100 UNIT/ML [place] SOLN Subcutaneous Inject 0-8 Units into the skin 3 (three) times daily before meals. Check blood sugar before each meal and use sliding scale to give insulin as needed. 1 vial 11  . INSULIN GLARGINE 100 UNIT/ML [place] SOLN Subcutaneous Inject 30 Units into the skin at bedtime. 10 mL 11  . INSULIN PEN NEEDLE 31G X 5 MM MISC  Inject subcuteaneously into skin 200 each 5  . METOPROLOL SUCCINATE ER 100 MG PO TB24 Oral Take 1 tablet (100 mg total) by mouth daily. Take with or immediately following a meal. 31 tablet 11  . NITROGLYCERIN 0.4 MG SL SUBL Sublingual Place 0.4 mg under the tongue every 5 (five) minutes as needed. For chest pain      BP  153/95  Pulse 87  Temp 98.4 F (36.9 C) (Oral)  Resp 18  SpO2 100%  Physical Exam  Nursing note and vitals reviewed. Constitutional: She is oriented to person, place, and time. She appears well-developed and well-nourished. No distress.  HENT:  Mouth/Throat: Oropharynx is clear and moist.  Eyes: Conjunctivae are normal. Pupils are equal, round, and reactive to light. No scleral icterus.  Neck: Normal range of motion. Neck supple. No tracheal deviation present.  Cardiovascular: Normal rate, regular rhythm, normal heart sounds and intact distal pulses.   Pulmonary/Chest: Effort normal and breath sounds normal. No respiratory distress.  Abdominal: Soft. Normal appearance and bowel sounds are normal. She  exhibits no distension. There is no tenderness.  Genitourinary:       No cva tenderness  Musculoskeletal: She exhibits no edema and no tenderness.  Neurological: She is alert and oriented to person, place, and time.  Skin: Skin is warm and dry. No rash noted.  Psychiatric: She has a normal mood and affect.    ED Course  Procedures (including critical care time)  Labs Reviewed  COMPREHENSIVE METABOLIC PANEL - Abnormal; Notable for the following:    Sodium 128 (*)     Chloride 95 (*)     Glucose, Bld 346 (*)     BUN 25 (*)     Creatinine, Ser 1.39 (*)     Albumin 3.4 (*)     Total Bilirubin 0.2 (*)     GFR calc non Af Amer 45 (*)     GFR calc Af Amer 53 (*)     All other components within normal limits  URINALYSIS, ROUTINE W REFLEX MICROSCOPIC - Abnormal; Notable for the following:    Glucose, UA >1000 (*)     Hgb urine dipstick MODERATE (*)     Protein, ur 100 (*)     All other components within normal limits  GLUCOSE, CAPILLARY - Abnormal; Notable for the following:    Glucose-Capillary 374 (*)     All other components within normal limits  CBC WITH DIFFERENTIAL  URINE MICROSCOPIC-ADD ON     MDM  Iv ns bolus. Labs.  novolog 8 units sq.   Additional ns bolus.  Reviewed nursing notes and prior charts for additional history.   pts metformin had been held by pcp per 'recent acute renal insuff'.   Pt states has adequate insulin for sliding scale as well as her lantus. Will have follow diabetic diet, continue insulin + ss, and follow up with her doctor in morning to discuss possible change in her insulin regimen.    Pt tolerating po fluids. No nv. No c/o. bs improved.      Suzi Roots, MD 08/16/11 1934  Suzi Roots, MD 08/16/11 Barry Brunner

## 2011-08-16 NOTE — ED Notes (Signed)
Pt with hx of diabetes, complains of thirst, being tired and blurred vision.

## 2011-08-17 MED FILL — Insulin Aspart Inj 100 Unit/ML: SUBCUTANEOUS | Qty: 0.08 | Status: AC

## 2011-08-17 NOTE — Care Management Note (Addendum)
    Page 1 of 1   08/17/2011     11:45:52 AM   CARE MANAGEMENT NOTE 08/17/2011  Patient:  Jade Bond,Jade Bond   Account Number:  [id number]  Date Initiated:  08/17/2011  Documentation initiated by:  Emilio Math  Subjective/Objective Assessment:   medication needs.     Action/Plan:   Anticipated DC Date:  08/16/2011   Anticipated DC Plan:  HOME/SELF CARE   Comments:  08/17/11 1130 L. [facility] BSN, [phone] Nurse Care Manager received call on 08/16/11 around 6pm for patient having medication needs. Nurse Care Manager talked to patient via telephone and she stated she currently does not have insurance but should have it again in end of August b/c that is when her job starts back up.  Patient has insulin at home and states has been taking that but not her po medicines of metformin,lisinopril and lipitor due to her insurance. Nurse Care Manager spoke to pharmacy at [facility]) and they stated that the metformin and lisinopril was on the $4.00 list at [facility].  Talked to patient and she stated she could afford the 2 medicines for 4$ each at [facility] would ask for a differenct cholesterol med from the $4 list from the MD.  All of this information was given to [facility] RN taking care of patient to give to MD when doing discharge. No other needs were identified at this time. Patient stated should could afford the medicines from [facility] if on the $4 list and does not need medication assistance, this passed on the the RN [place] also.

## 2011-08-17 NOTE — Care Management (Signed)
  CARE MANAGEMENT NOTE 08/17/2011  Patient:  Jade Bond,Jade Bond   Account Number:  [id number]  Date Initiated:  08/17/2011  Documentation initiated by:  Emilio Math  Subjective/Objective Assessment:   medication needs.     Action/Plan:   Anticipated DC Date:  08/16/2011   Anticipated DC Plan:  HOME/SELF CARE   Comments:  08/17/11 1130 L. [facility] BSN, [phone] Nurse Care Manager received call on 08/16/11 around 6pm for patient having medication needs. Nurse Care Manager talked to patient via telephone and she stated she currently does not have insurance but should have it again in end of August b/c that is when her job starts back up.  Patient has insulin at home and states has been taking that but not her po medicines of metformin,lisinopril and lipitor due to her insurance. Nurse Care Manager spoke to pharmacy at [facility]) and they stated that the metformin and lisinopril was on the $4.00 list at [facility].  Talked to patient and she stated she could afford the 2 medicines for 4$ each at [facility] would ask for a differenct cholesterol med from the $4 list from the MD.  All of this information was given to [facility] RN taking care of patient to give to MD when doing discharge. No other needs were identified at this time. Patient stated should could afford the medicines from [facility] if on the $4 list and does not need medication assistance, this passed on the the RN [place] also.

## 2011-11-22 ENCOUNTER — Telehealth: Payer: Self-pay | Admitting: Dietician

## 2011-11-22 ENCOUNTER — Ambulatory Visit: Payer: 59 | Admitting: Internal Medicine

## 2011-11-22 NOTE — Telephone Encounter (Signed)
Is due for follow up for her diabetes. Unable to reach patient by phone. Will mail letter

## 2011-11-24 ENCOUNTER — Ambulatory Visit (INDEPENDENT_AMBULATORY_CARE_PROVIDER_SITE_OTHER): Payer: 59 | Admitting: Internal Medicine

## 2011-11-24 ENCOUNTER — Encounter: Payer: Self-pay | Admitting: Internal Medicine

## 2011-11-24 VITALS — BP 133/96 | HR 93 | Temp 98.7°F | Wt 189.6 lb

## 2011-11-24 DIAGNOSIS — N179 Acute kidney failure, unspecified: Secondary | ICD-10-CM

## 2011-11-24 DIAGNOSIS — Z1231 Encounter for screening mammogram for malignant neoplasm of breast: Secondary | ICD-10-CM

## 2011-11-24 DIAGNOSIS — IMO0002 Reserved for concepts with insufficient information to code with codable children: Secondary | ICD-10-CM

## 2011-11-24 DIAGNOSIS — I798 Other disorders of arteries, arterioles and capillaries in diseases classified elsewhere: Secondary | ICD-10-CM

## 2011-11-24 DIAGNOSIS — J309 Allergic rhinitis, unspecified: Secondary | ICD-10-CM | POA: Insufficient documentation

## 2011-11-24 DIAGNOSIS — E785 Hyperlipidemia, unspecified: Secondary | ICD-10-CM

## 2011-11-24 DIAGNOSIS — T7840XA Allergy, unspecified, initial encounter: Secondary | ICD-10-CM

## 2011-11-24 DIAGNOSIS — Z23 Encounter for immunization: Secondary | ICD-10-CM

## 2011-11-24 DIAGNOSIS — Z79899 Other long term (current) drug therapy: Secondary | ICD-10-CM

## 2011-11-24 DIAGNOSIS — E1159 Type 2 diabetes mellitus with other circulatory complications: Secondary | ICD-10-CM

## 2011-11-24 DIAGNOSIS — E1151 Type 2 diabetes mellitus with diabetic peripheral angiopathy without gangrene: Secondary | ICD-10-CM

## 2011-11-24 DIAGNOSIS — Z803 Family history of malignant neoplasm of breast: Secondary | ICD-10-CM

## 2011-11-24 DIAGNOSIS — I10 Essential (primary) hypertension: Secondary | ICD-10-CM

## 2011-11-24 LAB — GLUCOSE, CAPILLARY: Glucose-Capillary: 365 mg/dL — ABNORMAL HIGH (ref 70–99)

## 2011-11-24 LAB — POCT GLYCOSYLATED HEMOGLOBIN (HGB A1C): Hemoglobin A1C: >14

## 2011-11-24 MED ORDER — INSULIN PEN NEEDLE 31G X 5 MM MISC: Status: DC

## 2011-11-24 MED ORDER — GLUCOSE BLOOD VI STRP: ORAL_STRIP | Status: DC

## 2011-11-24 MED ORDER — INSULIN NPH (HUMAN) (ISOPHANE) 100 UNIT/ML ~~LOC~~ SUSP: SUBCUTANEOUS | Status: DC

## 2011-11-24 MED ORDER — LISINOPRIL 5 MG PO TABS: 5.0000 mg | ORAL_TABLET | Freq: Every day | ORAL | Status: DC

## 2011-11-24 MED ORDER — ACCU-CHEK FASTCLIX LANCETS MISC: 1.0000 | Freq: Three times a day (TID) | Status: DC

## 2011-11-24 MED ORDER — CETIRIZINE HCL 10 MG PO TABS: 10.0000 mg | ORAL_TABLET | Freq: Every day | ORAL | Status: DC

## 2011-11-24 MED ORDER — FLUTICASONE PROPIONATE 50 MCG/ACT NA SUSP: 2.0000 | Freq: Every day | NASAL | Status: DC

## 2011-11-24 MED ORDER — INSULIN ASPART 100 UNIT/ML ~~LOC~~ SOLN: 0.0000 [IU] | Freq: Three times a day (TID) | SUBCUTANEOUS | Status: DC

## 2011-11-24 MED ORDER — METFORMIN HCL 1000 MG PO TABS: 1000.0000 mg | ORAL_TABLET | Freq: Two times a day (BID) | ORAL | Status: DC

## 2011-11-24 NOTE — Assessment & Plan Note (Addendum)
Adequately controlled.  BP 130/90's.  Will start Lisinopril 5mg  today . Will get BMP today and repeat BMP in 4 weeks

## 2011-11-24 NOTE — Progress Notes (Signed)
HPI: Jade Bond is a 46 yo woman with PMH of DM II presents today for routine follow up.  She has stopped all her meds since May because she did not have insurance but she has insurance now. Has not been checking her blood sugar at home. She c/o+ blurry vision, -numbness or tingling.  She thinks she had a head cold, eye itching, +sneezing, Not taking anything for it. No fever or chills.  No cough but does have post nasal drip.   She saw opthalmology in Jan 2013.    ROS: as per HPI  PE: General: alert, well-developed, and cooperative to examination.  HEENT: nasal mucosa edematous and erythematous. No purulent discharge.  TM clear Lungs: normal respiratory effort, no accessory muscle use, normal breath sounds, no crackles, and no wheezes. Heart: normal rate, regular rhythm, no murmur, no gallop, and no rub.  Abdomen: soft, non-tender, normal bowel sounds, no distention, no guarding, no rebound tenderness Msk: no joint swelling, no joint warmth, and no redness over joints.  Pulses: 2+ DP/PT pulses bilaterally Extremities: No cyanosis, clubbing, edema Neurologic: nonfocal

## 2011-11-24 NOTE — Assessment & Plan Note (Signed)
Start flonase and zyrtec

## 2011-11-24 NOTE — Assessment & Plan Note (Addendum)
Poorly controlled.  HbA1c today is >14.  She has not been on any meds since May of 2013.  Feet exam wnl, +2DP , no ulcer noted. She was on Lantus in the past but cannot afford copay. -Gave flu vaccine today -restart metformin 1000mg  bid -Start Humulin N Pen 15 units BID  -Novolog SSI (0-8 units) with meals -Start ACEi and continue statin -Lupita Leash Plyler spoke to her briefly so will make an official appt with Lupita Leash in 1 week -Follow up with me in 4 weeks and will repeat BMP

## 2011-11-24 NOTE — Patient Instructions (Addendum)
Start taking Lisinopril 5mg  one tablet daily Start Humulin N 15 units twice daily  Start Novolog sliding scale with meal-( inject 0-8 units) Start taking Metformin 1000mg  one tablet twice daily Get labs today and I will call you with any abnormal results You got flu vaccine today Follow up with Lupita Leash Plyler- diabetic educator in 1 week Check your blood sugar 3 times per day and bring your meter next office visit Follow up with Dr. Anselm Jungling in 3-4 weeks

## 2011-11-24 NOTE — Assessment & Plan Note (Signed)
LDL is 171 in 03/2011. Currently on Lipitor 10mg .  I will repeat her lipid panel in 4 weeks and may need to adjust her lipitor dose, she may need to increase dose

## 2011-11-24 NOTE — Assessment & Plan Note (Addendum)
Cr is 1.39 in 08/2011. This is likely due to her poorly controlled DM. -Will resume DM meds today and BMP today as well as repeat BMP in 4 weeks

## 2011-11-25 LAB — BASIC METABOLIC PANEL WITH GFR
BUN: 25 mg/dL — ABNORMAL HIGH (ref 6–23)
CO2: 21 mEq/L (ref 19–32)
Chloride: 100 mEq/L (ref 96–112)
GFR, Est African American: 55 mL/min — ABNORMAL LOW
GFR, Est Non African American: 48 mL/min — ABNORMAL LOW
Glucose, Bld: 355 mg/dL — ABNORMAL HIGH (ref 70–99)
Potassium: 4.4 mEq/L (ref 3.5–5.3)
Sodium: 131 mEq/L — ABNORMAL LOW (ref 135–145)

## 2011-12-02 ENCOUNTER — Telehealth: Payer: Self-pay | Admitting: Dietician

## 2011-12-02 NOTE — Telephone Encounter (Signed)
To remind patient of appointment on Monday at 10:30 am with CDE

## 2011-12-05 ENCOUNTER — Encounter: Payer: 59 | Admitting: Dietician

## 2011-12-06 ENCOUNTER — Telehealth: Payer: Self-pay | Admitting: Dietician

## 2011-12-06 NOTE — Telephone Encounter (Signed)
Patient left voicemail to reschedule CDE appointment.

## 2011-12-13 ENCOUNTER — Encounter (HOSPITAL_COMMUNITY): Payer: Self-pay | Admitting: Emergency Medicine

## 2011-12-13 ENCOUNTER — Emergency Department (HOSPITAL_COMMUNITY)
Admission: EM | Admit: 2011-12-13 | Discharge: 2011-12-13 | Disposition: A | Discharge status: Home / Self Care | Payer: 59 | Attending: Emergency Medicine | Admitting: Emergency Medicine

## 2011-12-13 DIAGNOSIS — I209 Angina pectoris, unspecified: Secondary | ICD-10-CM | POA: Insufficient documentation

## 2011-12-13 DIAGNOSIS — Z79899 Other long term (current) drug therapy: Secondary | ICD-10-CM | POA: Insufficient documentation

## 2011-12-13 DIAGNOSIS — N390 Urinary tract infection, site not specified: Secondary | ICD-10-CM

## 2011-12-13 DIAGNOSIS — R110 Nausea: Secondary | ICD-10-CM | POA: Insufficient documentation

## 2011-12-13 DIAGNOSIS — R739 Hyperglycemia, unspecified: Secondary | ICD-10-CM

## 2011-12-13 DIAGNOSIS — Z794 Long term (current) use of insulin: Secondary | ICD-10-CM | POA: Insufficient documentation

## 2011-12-13 DIAGNOSIS — Z7982 Long term (current) use of aspirin: Secondary | ICD-10-CM | POA: Insufficient documentation

## 2011-12-13 DIAGNOSIS — E785 Hyperlipidemia, unspecified: Secondary | ICD-10-CM | POA: Insufficient documentation

## 2011-12-13 DIAGNOSIS — I10 Essential (primary) hypertension: Secondary | ICD-10-CM | POA: Insufficient documentation

## 2011-12-13 DIAGNOSIS — E1169 Type 2 diabetes mellitus with other specified complication: Secondary | ICD-10-CM | POA: Insufficient documentation

## 2011-12-13 LAB — CBC WITH DIFFERENTIAL/PLATELET
Basophils Absolute: 0 10*3/uL (ref 0.0–0.1)
Basophils Relative: 0 % (ref 0–1)
Hemoglobin: 12.5 g/dL (ref 12.0–15.0)
Lymphocytes Relative: 33 % (ref 12–46)
MCHC: 34.7 g/dL (ref 30.0–36.0)
Monocytes Relative: 4 % (ref 3–12)
Neutro Abs: 3.3 10*3/uL (ref 1.7–7.7)
Neutrophils Relative %: 61 % (ref 43–77)
RBC: 4.05 MIL/uL (ref 3.87–5.11)
WBC: 5.4 10*3/uL (ref 4.0–10.5)

## 2011-12-13 LAB — URINALYSIS, ROUTINE W REFLEX MICROSCOPIC
Leukocytes, UA: NEGATIVE
Nitrite: NEGATIVE
Specific Gravity, Urine: 1.019 (ref 1.005–1.030)
pH: 5 (ref 5.0–8.0)

## 2011-12-13 LAB — URINE MICROSCOPIC-ADD ON

## 2011-12-13 LAB — GLUCOSE, CAPILLARY
Glucose-Capillary: 136 mg/dL — ABNORMAL HIGH (ref 70–99)
Glucose-Capillary: 220 mg/dL — ABNORMAL HIGH (ref 70–99)

## 2011-12-13 LAB — BASIC METABOLIC PANEL
BUN: 18 mg/dL (ref 6–23)
CO2: 25 mEq/L (ref 19–32)
Chloride: 97 mEq/L (ref 96–112)
GFR calc Af Amer: 62 mL/min — ABNORMAL LOW (ref >90)
Potassium: 4.7 mEq/L (ref 3.5–5.1)

## 2011-12-13 MED ORDER — ONDANSETRON HCL 4 MG/2ML IJ SOLN
4.0000 mg | Freq: Once | INTRAMUSCULAR | Status: AC
  Administered 2011-12-13: 4 mg via INTRAVENOUS
  Filled 2011-12-13: qty 2

## 2011-12-13 MED ORDER — ONDANSETRON 8 MG PO TBDP: 8.0000 mg | ORAL_TABLET | Freq: Three times a day (TID) | ORAL | Status: DC | PRN

## 2011-12-13 MED ORDER — ONDANSETRON HCL 4 MG/2ML IJ SOLN
4.0000 mg | Freq: Once | INTRAMUSCULAR | Status: AC
  Administered 2011-12-13: 4 mg via INTRAVENOUS

## 2011-12-13 MED ORDER — NITROFURANTOIN MONOHYD MACRO 100 MG PO CAPS: 100.0000 mg | ORAL_CAPSULE | Freq: Two times a day (BID) | ORAL | Status: DC

## 2011-12-13 MED ORDER — SODIUM CHLORIDE 0.9 % IV BOLUS (SEPSIS)
1000.0000 mL | Freq: Once | INTRAVENOUS | Status: AC
  Administered 2011-12-13: 1000 mL via INTRAVENOUS

## 2011-12-13 MED ORDER — ONDANSETRON HCL 4 MG/2ML IJ SOLN
INTRAMUSCULAR | Status: AC
  Filled 2011-12-13: qty 2

## 2011-12-13 MED ORDER — INSULIN ASPART 100 UNIT/ML ~~LOC~~ SOLN
10.0000 [IU] | Freq: Once | SUBCUTANEOUS | Status: AC
  Administered 2011-12-13: 10 [IU] via INTRAVENOUS
  Filled 2011-12-13: qty 1

## 2011-12-13 NOTE — ED Notes (Signed)
Notified Pascal Lux [place], PA of pts CBG result

## 2011-12-13 NOTE — ED Provider Notes (Signed)
History     CSN: [id number]  Arrival date & time 12/13/11  1244   First MD Initiated Contact with Patient 12/13/11 1617      Chief Complaint  Patient presents with  . Fatigue  . Weakness  . Nausea    (Consider location/radiation/quality/duration/timing/severity/associated sxs/prior treatment) HPI Comments: Patient presents today with a chief complaint of generalized fatigue and generalized weakness.  Symptoms have been present since 11/24/11.  She was seen by her PCP on 11/24/11 for management of her DM.  At that time she was started back on her insulin and her metformin, which she had been off for several months prior to that.  She states that symptoms started when she started taking her insulin again.  She reports that her blood sugars have been running  between 150-200.   She reports that she has felt nauseous, but no vomiting.  No abdominal pain.  No CP or SOB.  No headaches.  She last took her insulin this morning.    The history is provided by the patient.    Past Medical History  Diagnosis Date  . Hypertension   . Hyperlipidemia   . Angina   . Diabetes mellitus     Past Surgical History  Procedure Date  . Tonsillectomy 1980  . Cardiac catheterization ~ 2008    negative, no intervention needed  . Tubal ligation 1995    Family History  Problem Relation Age of Onset  . Malignant hyperthermia Mother     History  Substance Use Topics  . Smoking status: Never Smoker   . Smokeless tobacco: Not on file  . Alcohol Use: No    OB History    Grav Para Term Preterm Abortions TAB SAB Ect Mult Living                  Review of Systems  Constitutional: Negative for fever and chills.  Eyes: Negative for visual disturbance.  Respiratory: Negative for shortness of breath.   Cardiovascular: Negative for chest pain.  Gastrointestinal: Positive for nausea. Negative for vomiting, abdominal pain, diarrhea and constipation.  Genitourinary: Negative for dysuria, urgency  and frequency.  Neurological: Negative for dizziness, syncope, light-headedness and headaches.  Psychiatric/Behavioral: Negative for confusion.    Allergies  Review of patient's allergies indicates no known allergies.  Home Medications   Current Outpatient Rx  Name  Route  Sig  Dispense  Refill  . ATORVASTATIN CALCIUM 10 MG PO TABS   Oral   Take 1 tablet (10 mg total) by mouth daily.   31 tablet   11   . INSULIN ASPART 100 UNIT/ML [place] SOLN   Subcutaneous   Inject 4-10 Units into the skin 3 (three) times daily before meals. Sliding scale as directed. Pt takes anywhere from 4-10 units when blood sugar is >150.         Marland Kitchen LISINOPRIL 5 MG PO TABS   Oral   Take 1 tablet (5 mg total) by mouth daily.   30 tablet   6   . METFORMIN HCL 1000 MG PO TABS   Oral   Take 1 tablet (1,000 mg total) by mouth 2 (two) times daily with a meal.   60 tablet   6   . ASPIRIN 81 MG PO TABS   Oral   Take 81 mg by mouth daily.         Marland Kitchen CETIRIZINE HCL 10 MG PO TABS   Oral   Take 1 tablet (10 mg total)  by mouth daily.   30 tablet   3   . FLUTICASONE PROPIONATE 50 MCG/ACT NA SUSP   Nasal   Place 2 sprays into the nose daily.   16 g   2   . INSULIN ISOPHANE HUMAN 100 UNIT/ML [place] SUSP      Inject 15 units twice daily   1 vial   12   . NITROGLYCERIN 0.4 MG SL SUBL   Sublingual   Place 0.4 mg under the tongue every 5 (five) minutes as needed. For chest pain           BP 157/84  Pulse 80  Temp 98 F (36.7 C) (Oral)  Resp 20  SpO2 100%  Physical Exam  Nursing note and vitals reviewed. Constitutional: She appears well-developed and well-nourished. No distress.  HENT:  Head: Normocephalic and atraumatic.  Mouth/Throat: Oropharynx is clear and moist.  Eyes: EOM are normal. Pupils are equal, round, and reactive to light.  Neck: Normal range of motion. Neck supple.  Cardiovascular: Normal rate, regular rhythm and normal heart sounds.   Pulmonary/Chest: Effort normal and breath  sounds normal.  Abdominal: Soft. Bowel sounds are normal. She exhibits no distension and no mass. There is no tenderness. There is no rebound and no guarding.  Neurological: She is alert. No cranial nerve deficit. Gait normal.  Skin: Skin is warm and dry. She is not diaphoretic.  Psychiatric: She has a normal mood and affect.    ED Course  Procedures (including critical care time)  Labs Reviewed  BASIC METABOLIC PANEL - Abnormal; Notable for the following:    Sodium 132 (*)     Glucose, Bld 321 (*)     Creatinine, Ser 1.20 (*)     GFR calc non Af Amer 54 (*)     GFR calc Af Amer 62 (*)     All other components within normal limits  URINALYSIS, ROUTINE W REFLEX MICROSCOPIC - Abnormal; Notable for the following:    Glucose, UA >1000 (*)     Protein, ur 100 (*)     All other components within normal limits  GLUCOSE, CAPILLARY - Abnormal; Notable for the following:    Glucose-Capillary 324 (*)     All other components within normal limits  URINE MICROSCOPIC-ADD ON - Abnormal; Notable for the following:    Bacteria, UA MANY (*)     Casts HYALINE CASTS (*)     All other components within normal limits  CBC WITH DIFFERENTIAL  URINE CULTURE   No results found.   No diagnosis found.  7:47 PM Patient able to eat sandwich and drink fluids.  She reports that her symptoms have improved and is requesting discharge home.  MDM  Patient presenting with hyperglycemia.  Anion gap=10.  Normal CO2.  No DKA.  Patient is also complaining of generalized fatigue that has been present since she was started back on her DM medications on 11/24/11.  Aside from her elevated blood glucose, her labs are unremarkable.  Therefore, suspect that her symptoms are related to the change in medication.  Patient able to tolerate po liquids and able to eat while in the ED.  Patient instructed to follow up with PCP and to continue taking her DM medications as prescribed.  Return precautions discussed with the patient.           Pascal Lux [place], PA-C 12/14/11 2221

## 2011-12-13 NOTE — ED Notes (Signed)
Pt states "just real weak" pt states "never felt so fatigued"

## 2011-12-13 NOTE — ED Notes (Signed)
Pt c/o generalized weakness with lethargy x 2 days with some nausea; pt sts recently started back on her meds that she had been off of for some time

## 2011-12-13 NOTE — ED Notes (Signed)
Pt denies pain; pt states nausea; pt given ice water for fluid challenge

## 2011-12-13 NOTE — ED Notes (Signed)
Magnus Sinning, PA at bedside; notified of pts CBG result

## 2011-12-13 NOTE — ED Notes (Signed)
Pt took own blood glucose with home monitor and read 345; Pascal Lux Wingen aware.

## 2011-12-13 NOTE — ED Notes (Signed)
Heather Van Wingen, PA at bedside.  

## 2011-12-14 LAB — URINE CULTURE: Colony Count: >=100000

## 2011-12-15 NOTE — ED Notes (Signed)
+   Urine] Patient treated with Macrobid-sensitive to same-chart appended per protocol MD. 

## 2011-12-15 NOTE — ED Provider Notes (Signed)
Medical screening examination/treatment/procedure(s) were performed by non-physician practitioner and as supervising physician I was immediately available for consultation/collaboration.   Laray Anger, DO 12/15/11 1324

## 2011-12-27 ENCOUNTER — Encounter: Payer: 59 | Admitting: Internal Medicine

## 2012-01-11 ENCOUNTER — Ambulatory Visit
Admission: RE | Admit: 2012-01-11 | Discharge: 2012-01-11 | Disposition: A | Discharge status: Home / Self Care | Payer: 59 | Source: Ambulatory Visit | Attending: Internal Medicine | Admitting: Internal Medicine

## 2012-01-11 DIAGNOSIS — Z1231 Encounter for screening mammogram for malignant neoplasm of breast: Secondary | ICD-10-CM

## 2012-01-18 ENCOUNTER — Other Ambulatory Visit: Payer: Self-pay | Admitting: Internal Medicine

## 2012-01-18 DIAGNOSIS — R928 Other abnormal and inconclusive findings on diagnostic imaging of breast: Secondary | ICD-10-CM

## 2012-02-09 ENCOUNTER — Ambulatory Visit
Admission: RE | Admit: 2012-02-09 | Discharge: 2012-02-09 | Disposition: A | Discharge status: Home / Self Care | Payer: 59 | Source: Ambulatory Visit | Attending: Internal Medicine | Admitting: Internal Medicine

## 2012-02-09 DIAGNOSIS — R928 Other abnormal and inconclusive findings on diagnostic imaging of breast: Secondary | ICD-10-CM

## 2012-02-27 ENCOUNTER — Other Ambulatory Visit: Payer: Self-pay | Admitting: *Deleted

## 2012-02-27 ENCOUNTER — Encounter: Payer: Self-pay | Admitting: Internal Medicine

## 2012-02-27 MED ORDER — GLUCOSE BLOOD VI STRP: ORAL_STRIP | Status: DC

## 2012-02-28 ENCOUNTER — Telehealth: Payer: Self-pay | Admitting: *Deleted

## 2012-02-28 NOTE — Telephone Encounter (Signed)
Call from [facility] - requesting refill on pt's test strips. Pt was called and stated it's ok and she has received diabetic test supplies from this pharmacy before. Verbal order given to [facility], the pharmacist, as written 1/27 per Dr Anselm Jungling.

## 2012-03-13 ENCOUNTER — Encounter: Payer: Self-pay | Admitting: Internal Medicine

## 2012-03-13 ENCOUNTER — Other Ambulatory Visit: Payer: Self-pay | Admitting: Internal Medicine

## 2012-03-13 DIAGNOSIS — G990 Autonomic neuropathy in diseases classified elsewhere: Secondary | ICD-10-CM | POA: Insufficient documentation

## 2012-03-13 DIAGNOSIS — E1165 Type 2 diabetes mellitus with hyperglycemia: Secondary | ICD-10-CM | POA: Insufficient documentation

## 2012-03-13 DIAGNOSIS — IMO0002 Reserved for concepts with insufficient information to code with codable children: Secondary | ICD-10-CM | POA: Insufficient documentation

## 2012-03-13 DIAGNOSIS — R921 Mammographic calcification found on diagnostic imaging of breast: Secondary | ICD-10-CM

## 2012-03-13 DIAGNOSIS — G909 Disorder of the autonomic nervous system, unspecified: Secondary | ICD-10-CM | POA: Insufficient documentation

## 2012-03-13 NOTE — Addendum Note (Signed)
Addended by: Maura Crandall on: 03/13/2012 11:52 AM   Modules accepted: Orders

## 2012-04-02 ENCOUNTER — Other Ambulatory Visit: Payer: Self-pay | Admitting: Internal Medicine

## 2012-04-02 ENCOUNTER — Ambulatory Visit
Admission: RE | Admit: 2012-04-02 | Discharge: 2012-04-02 | Disposition: A | Discharge status: Home / Self Care | Payer: No Typology Code available for payment source | Source: Ambulatory Visit | Attending: Internal Medicine | Admitting: Internal Medicine

## 2012-04-02 DIAGNOSIS — R921 Mammographic calcification found on diagnostic imaging of breast: Secondary | ICD-10-CM

## 2012-08-09 ENCOUNTER — Other Ambulatory Visit: Payer: Self-pay

## 2013-02-03 ENCOUNTER — Encounter (HOSPITAL_COMMUNITY): Payer: Self-pay | Admitting: Emergency Medicine

## 2013-02-03 ENCOUNTER — Emergency Department (HOSPITAL_COMMUNITY)
Admission: EM | Admit: 2013-02-03 | Discharge: 2013-02-03 | Disposition: A | Discharge status: Home / Self Care | Payer: 59 | Attending: Emergency Medicine | Admitting: Emergency Medicine

## 2013-02-03 DIAGNOSIS — E7800 Pure hypercholesterolemia, unspecified: Secondary | ICD-10-CM | POA: Insufficient documentation

## 2013-02-03 DIAGNOSIS — R739 Hyperglycemia, unspecified: Secondary | ICD-10-CM

## 2013-02-03 DIAGNOSIS — I209 Angina pectoris, unspecified: Secondary | ICD-10-CM | POA: Insufficient documentation

## 2013-02-03 DIAGNOSIS — E785 Hyperlipidemia, unspecified: Secondary | ICD-10-CM | POA: Insufficient documentation

## 2013-02-03 DIAGNOSIS — Z9089 Acquired absence of other organs: Secondary | ICD-10-CM | POA: Insufficient documentation

## 2013-02-03 DIAGNOSIS — I10 Essential (primary) hypertension: Secondary | ICD-10-CM | POA: Insufficient documentation

## 2013-02-03 DIAGNOSIS — Z794 Long term (current) use of insulin: Secondary | ICD-10-CM | POA: Insufficient documentation

## 2013-02-03 DIAGNOSIS — Z7982 Long term (current) use of aspirin: Secondary | ICD-10-CM | POA: Insufficient documentation

## 2013-02-03 DIAGNOSIS — Z95818 Presence of other cardiac implants and grafts: Secondary | ICD-10-CM | POA: Insufficient documentation

## 2013-02-03 DIAGNOSIS — E119 Type 2 diabetes mellitus without complications: Secondary | ICD-10-CM | POA: Insufficient documentation

## 2013-02-03 DIAGNOSIS — K0510 Chronic gingivitis, plaque induced: Secondary | ICD-10-CM | POA: Insufficient documentation

## 2013-02-03 DIAGNOSIS — Z79899 Other long term (current) drug therapy: Secondary | ICD-10-CM | POA: Insufficient documentation

## 2013-02-03 LAB — GLUCOSE, CAPILLARY: Glucose-Capillary: 486 mg/dL — ABNORMAL HIGH (ref 70–99)

## 2013-02-03 MED ORDER — INSULIN GLARGINE 100 UNIT/ML ~~LOC~~ SOLN: 25.0000 [IU] | Freq: Every day | SUBCUTANEOUS | Status: DC

## 2013-02-03 MED ORDER — LISINOPRIL 10 MG PO TABS: 10.0000 mg | ORAL_TABLET | Freq: Every day | ORAL | Status: DC

## 2013-02-03 MED ORDER — PENICILLIN V POTASSIUM 500 MG PO TABS: 500.0000 mg | ORAL_TABLET | Freq: Four times a day (QID) | ORAL | Status: AC

## 2013-02-03 MED ORDER — INSULIN LISPRO 100 UNIT/ML ~~LOC~~ SOLN: SUBCUTANEOUS | Status: DC

## 2013-02-03 MED ORDER — ATORVASTATIN CALCIUM 10 MG PO TABS: 10.0000 mg | ORAL_TABLET | Freq: Every day | ORAL | Status: DC

## 2013-02-03 NOTE — ED Provider Notes (Signed)
CSN: [id number]     Arrival date & time 02/03/13  1403 History   First MD Initiated Contact with Patient 02/03/13 1516     Chief Complaint  Patient presents with  . Oral Swelling  . Hyperglycemia    HPI  Patient presents with assistance her last period was evaluated for a pain and swelling adjacent her top teeth are top. The second is to be placed back on her medications. She is history of hypertension diabetes and hypercholesterolemia. Previously on insulin multiple medications. Lost her insurance and her means to pay for her prescriptions a few months ago. She started new job. As above January 1 is insured. She does not have existing prescriptions. She still has her glucose meter and strips at home. She started noticing swelling adjacent to 3 days ago. It is painful and swollen she posterior teeth. This morning a little swollen.  Past Medical History  Diagnosis Date  . Hypertension   . Hyperlipidemia   . Angina   . Diabetes mellitus    Past Surgical History  Procedure Laterality Date  . Tonsillectomy  1980  . Cardiac catheterization  ~ 2008    negative, no intervention needed  . Tubal ligation  1995   Family History  Problem Relation Age of Onset  . Malignant hyperthermia Mother    History  Substance Use Topics  . Smoking status: Never Smoker   . Smokeless tobacco: Not on file  . Alcohol Use: No   OB History   Grav Para Term Preterm Abortions TAB SAB Ect Mult Living                 Review of Systems  Constitutional: Negative for fever, chills, diaphoresis, appetite change and fatigue.  HENT: Positive for mouth sores. Negative for sore throat and trouble swallowing.   Eyes: Negative for visual disturbance.  Respiratory: Negative for cough, chest tightness, shortness of breath and wheezing.   Cardiovascular: Negative for chest pain.  Gastrointestinal: Negative for nausea, vomiting, abdominal pain, diarrhea and abdominal distention.  Endocrine: Positive for polydipsia,  polyphagia and polyuria.  Genitourinary: Negative for dysuria, frequency, hematuria and difficulty urinating.  Musculoskeletal: Negative for gait problem.  Skin: Negative for color change, pallor and rash.  Neurological: Negative for dizziness, syncope, light-headedness and headaches.  Hematological: Does not bruise/bleed easily.  Psychiatric/Behavioral: Negative for behavioral problems and confusion.    Allergies  Review of patient's allergies indicates no known allergies.  Home Medications   Current Outpatient Rx  Name  Route  Sig  Dispense  Refill  . aspirin 81 MG tablet   Oral   Take 81 mg by mouth daily.         Marland Kitchen atorvastatin (LIPITOR) 10 MG tablet   Oral   Take 1 tablet (10 mg total) by mouth daily.   31 tablet   11   . insulin aspart (NOVOLOG PENFILL) 100 UNIT/ML injection   Subcutaneous   Inject 4-10 Units into the skin 3 (three) times daily before meals. Sliding scale as directed. Pt takes anywhere from 4-10 units when blood sugar is >150.         Marland Kitchen insulin glargine (LANTUS) 100 UNIT/ML injection   Subcutaneous   Inject 25 Units into the skin at bedtime. 25 units in the am and 30 units pm         . lisinopril (PRINIVIL,ZESTRIL) 5 MG tablet   Oral   Take 1 tablet (5 mg total) by mouth daily.   30 tablet  6   . nitroGLYCERIN (NITROSTAT) 0.4 MG SL tablet   Sublingual   Place 0.4 mg under the tongue every 5 (five) minutes as needed. For chest pain         . atorvastatin (LIPITOR) 10 MG tablet   Oral   Take 1 tablet (10 mg total) by mouth daily.   130 tablet   0   . insulin glargine (LANTUS) 100 UNIT/ML injection   Subcutaneous   Inject 0.25 mLs (25 Units total) into the skin at bedtime.   10 mL   0   . insulin lispro (HUMALOG) 100 UNIT/ML injection      As per your sliding scale and carbohydrate counting   10 mL   11   . lisinopril (PRINIVIL,ZESTRIL) 10 MG tablet   Oral   Take 1 tablet (10 mg total) by mouth daily.   30 tablet   0   .  penicillin v potassium (VEETID) 500 MG tablet   Oral   Take 1 tablet (500 mg total) by mouth 4 (four) times daily.   40 tablet   0    BP 155/77  Pulse 85  Temp(Src) 98.6 F (37 C) (Oral)  Resp 20  SpO2 98%  LMP 01/14/2013 Physical Exam  Constitutional: She is oriented to person, place, and time. She appears well-developed and well-nourished. No distress.  HENT:  Head: Normocephalic.  Mouth/Throat:    Eyes: Conjunctivae are normal. Pupils are equal, round, and reactive to light. No scleral icterus.  Neck: Normal range of motion. Neck supple. No thyromegaly present.  Cardiovascular: Normal rate and regular rhythm.  Exam reveals no gallop and no friction rub.   No murmur heard. Pulmonary/Chest: Effort normal and breath sounds normal. No respiratory distress. She has no wheezes. She has no rales.  Abdominal: Soft. Bowel sounds are normal. She exhibits no distension. There is no tenderness. There is no rebound.  Musculoskeletal: Normal range of motion.  Neurological: She is alert and oriented to person, place, and time.  Skin: Skin is warm and dry. No rash noted.  Psychiatric: She has a normal mood and affect. Her behavior is normal.    ED Course  Procedures (including critical care time) Labs Review Labs Reviewed  GLUCOSE, CAPILLARY - Abnormal; Notable for the following:    Glucose-Capillary 486 (*)    All other components within normal limits   Imaging Review No results found.  EKG Interpretation   None       MDM   1. Gingivitis   2. Hyperglycemia    Does appear to be a localized periodontitis rather than ulcerative or viral stomatitis. She does use some relief of saltwater gargles. She will continue these. Panel B. penicillin. Given refills on her medications including insulin, Cipro, Lipitor,. She will start to check and record her blood sugars before each meal and at bedtime. Astra the aware of her blood sugar and her plans for taking by mouth before taking her  sugars. She was on insulin for over a year feels comfortable with its use. She states she still does have contact information for her primary care physician.    Tanna Furry, MD 02/03/13 [phone]

## 2013-02-03 NOTE — ED Notes (Signed)
Pt from home reports swollen gums. Pt has been swishing with salt water with no relief. Pt states that she lost her insurance at work last year and has been unable to get her PO meds or insulin. Pt states that her insurance went into effect this year and she is planning on making appts with all her MD's to get her rx's. Pt states that her only s/sx from high CBG is frequent urination. Pt has no other c/o.  Pt is A&O and in NAD

## 2013-02-03 NOTE — Discharge Instructions (Signed)
Re check with your doctor. Continue salt water gargles. Check and record your blood sugar before each meal, and before bed. Know your blood sugar, and your plans for eating before you take insulin.  Glucose, Blood Sugar, Fasting Blood Sugar This is a test to measure your blood sugar. Glucose is a simple sugar that serves as the main source of energy for the body. The carbohydrates we eat are broken down into glucose (and a few other simple sugars), absorbed by the small intestine, and circulated throughout the body. Most of the body's cells require glucose for energy production; brain and nervous system cells not only rely on glucose for energy, they can only function when glucose levels in the blood remain above a certain level.  The body's use of glucose hinges on the availability of insulin, a hormone produced by the pancreas. Insulin acts as a Control and instrumentation engineer, transporting glucose into the body's cells, directing the body to store excess glucose as glycogen (for short-term storage) and/or as triglycerides in fat cells. We can not live without glucose or insulin, and they must be in balance.  Normally, blood glucose levels rise slightly after a meal, and insulin is secreted to lower them, with the amount of insulin released matched up with the size and content of the meal. If blood glucose levels drop too low, such as might occur in between meals or after a strenuous workout, glucagon (another pancreatic hormone) is secreted to tell the liver to turn some glycogen back into glucose, raising the blood glucose levels. If the glucose/insulin feedback mechanism is working properly, the amount of glucose in the blood remains fairly stable. If the balance is disrupted and glucose levels in the blood rise, then the body tries to restore the balance, both by increasing insulin production and by excreting glucose in the urine.  PREPARATION FOR TEST A blood sample drawn from a vein in your arm or, for a self  check, a drop of blood from a skin prick; in general, it may be recommended that you fast before having a blood glucose test; sometimes a random (no preparation) urine sample is used. Your caregiver will instruct you as to what they want prior to your testing. NORMAL FINDINGS Normal values depend on many factors. Your lab will provide a range of normal values with your test results. The following information summarizes the meaning of the test results. These are based on the clinical practice recommendations of the American Diabetes Association.  FASTING BLOOD GLUCOSE  From 70 to 99 mg/dL (3.9 to 5.5 mmol/L): Normal glucose tolerance  From 100 to 125 mg/dL (5.6 to 6.9 mmol/L):Impaired fasting glucose (pre-diabetes)  126 mg/dL (7.0 mmol/L) and above on more than one testing occasion: Diabetes ORAL GLUCOSE TOLERANCE TEST (OGTT) [EXCEPT PREGNANCY] (2 HOURS AFTER A 75-GRAM GLUCOSE DRINK)  Less than 140 mg/dL (7.8 mmol/L): Normal glucose tolerance  From 140 to 200 mg/dL (7.8 to 11.1 mmol/L): Impaired glucose tolerance (pre-diabetes)  Over 200 mg/dL (11.1 mmol/L) on more than one testing occasion: Diabetes GESTATIONAL DIABETES SCREENING: GLUCOSE CHALLENGE TEST (1 HOUR AFTER A 50-GRAM GLUCOSE DRINK)  Less than 140* mg/dL (7.8 mmol/L): Normal glucose tolerance  140* mg/dL (7.8 mmol/L) and over: Abnormal, needs OGTT (see below) * Some use a cutoff of More Than 130 mg/dL (7.2 mmol/L) because that identifies 90% of women with gestational diabetes, compared to 80% identified using the threshold of More Than 140 mg/dL (7.8 mmol/L). GESTATIONAL DIABETES DIAGNOSTIC: OGTT (100-GRAM GLUCOSE DRINK)  Fasting*..........................................95 mg/dL (5.3  mmol/L)  1 hour after glucose load*..............180 mg/dL (10.0 mmol/L)  2 hours after glucose load*.............155 mg/dL (8.6 mmol/L)  3 hours after glucose load* **.........140 mg/dL (7.8 mmol/L) * If two or more values are above the  criteria, gestational diabetes is diagnosed. ** A 75-gram glucose load may be used, although this method is not as well validated as the 100-gram OGTT; the 3-hour sample is not drawn if 75 grams is used.  Ranges for normal findings may vary among different laboratories and hospitals. You should always check with your doctor after having lab work or other tests done to discuss the meaning of your test results and whether your values are considered within normal limits. MEANING OF TEST  Your caregiver will go over the test results with you and discuss the importance and meaning of your results, as well as treatment options and the need for additional tests if necessary. OBTAINING THE TEST RESULTS It is your responsibility to obtain your test results. Ask the lab or department performing the test when and how you will get your results. Document Released: 02/19/2004 Document Revised: 04/11/2011 Document Reviewed: 12/29/2007 [facility] Patient Information 2014 [place], [place].  Gingivitis  Gingivitis is an infection of the teeth and bones that support the teeth. Your gums become red, sore, and puffy (swollen). It is caused by germs that build up on your teeth and gums (plaque). HOME CARE  Floss and then brush your teeth.  Brush at least twice a day.  Floss at least once a day.  Avoid sugar between meals.  Do not drink juice before bed. Only drink water.  Make and keep your regular checkups and cleanings with your dentist.  Use any mouth care product or toothpaste as told by your dentist. GET HELP RIGHT AWAY IF:  You have painful, red tissue around your teeth.  You have trouble chewing.  You have loose or infected teeth. MAKE SURE YOU:  Understand these instructions.  Will watch your condition.  Will get help right away if you are not doing well or get worse. Document Released: 02/19/2010 Document Revised: 04/11/2011 Document Reviewed: 02/19/2010 [facility] Patient Information 2014  [place], [place].  Hyperglycemia Hyperglycemia occurs when the glucose (sugar) in your blood is too high. Hyperglycemia can happen for many reasons, but it most often happens to people who do not know they have diabetes or are not managing their diabetes properly.  CAUSES  Whether you have diabetes or not, there are other causes of hyperglycemia. Hyperglycemia can occur when you have diabetes, but it can also occur in other situations that you might not be as aware of, such as: Diabetes  If you have diabetes and are having problems controlling your blood glucose, hyperglycemia could occur because of some of the following reasons:  Not following your meal plan.  Not taking your diabetes medications or not taking it properly.  Exercising less or doing less activity than you normally do.  Being sick. Pre-diabetes  This cannot be ignored. Before people develop Type 2 diabetes, they almost always have "pre-diabetes." This is when your blood glucose levels are higher than normal, but not yet high enough to be diagnosed as diabetes. Research has shown that some long-term damage to the body, especially the heart and circulatory system, may already be occurring during pre-diabetes. If you take action to manage your blood glucose when you have pre-diabetes, you may delay or prevent Type 2 diabetes from developing. Stress  If you have diabetes, you may be "diet" controlled or  on oral medications or insulin to control your diabetes. However, you may find that your blood glucose is higher than usual in the hospital whether you have diabetes or not. This is often referred to as "stress hyperglycemia." Stress can elevate your blood glucose. This happens because of hormones put out by the body during times of stress. If stress has been the cause of your high blood glucose, it can be followed regularly by your caregiver. That way he/she can make sure your hyperglycemia does not continue to get worse or progress to  diabetes. Steroids  Steroids are medications that act on the infection fighting system (immune system) to block inflammation or infection. One side effect can be a rise in blood glucose. Most people can produce enough extra insulin to allow for this rise, but for those who cannot, steroids make blood glucose levels go even higher. It is not unusual for steroid treatments to "uncover" diabetes that is developing. It is not always possible to determine if the hyperglycemia will go away after the steroids are stopped. A special blood test called an A1c is sometimes done to determine if your blood glucose was elevated before the steroids were started. SYMPTOMS  Thirsty.  Frequent urination.  Dry mouth.  Blurred vision.  Tired or fatigue.  Weakness.  Sleepy.  Tingling in feet or leg. DIAGNOSIS  Diagnosis is made by monitoring blood glucose in one or all of the following ways:  A1c test. This is a chemical found in your blood.  Fingerstick blood glucose monitoring.  Laboratory results. TREATMENT  First, knowing the cause of the hyperglycemia is important before the hyperglycemia can be treated. Treatment may include, but is not be limited to:  Education.  Change or adjustment in medications.  Change or adjustment in meal plan.  Treatment for an illness, infection, etc.  More frequent blood glucose monitoring.  Change in exercise plan.  Decreasing or stopping steroids.  Lifestyle changes. HOME CARE INSTRUCTIONS   Test your blood glucose as directed.  Exercise regularly. Your caregiver will give you instructions about exercise. Pre-diabetes or diabetes which comes on with stress is helped by exercising.  Eat wholesome, balanced meals. Eat often and at regular, fixed times. Your caregiver or nutritionist will give you a meal plan to guide your sugar intake.  Being at an ideal weight is important. If needed, losing as little as 10 to 15 pounds may help improve blood  glucose levels. SEEK MEDICAL CARE IF:   You have questions about medicine, activity, or diet.  You continue to have symptoms (problems such as increased thirst, urination, or weight gain). SEEK IMMEDIATE MEDICAL CARE IF:   You are vomiting or have diarrhea.  Your breath smells fruity.  You are breathing faster or slower.  You are very sleepy or incoherent.  You have numbness, tingling, or pain in your feet or hands.  You have chest pain.  Your symptoms get worse even though you have been following your caregiver's orders.  If you have any other questions or concerns. Document Released: 07/13/2000 Document Revised: 04/11/2011 Document Reviewed: 05/16/2011 [facility] Patient Information 2014 [place], [place].

## 2013-02-03 NOTE — ED Notes (Signed)
Bed: LD35 Expected date:  Expected time:  Means of arrival:  Comments: Pt. Still in room

## 2013-02-21 ENCOUNTER — Encounter: Payer: Self-pay | Admitting: Internal Medicine

## 2013-02-21 ENCOUNTER — Telehealth: Payer: Self-pay | Admitting: Internal Medicine

## 2013-02-21 ENCOUNTER — Ambulatory Visit (INDEPENDENT_AMBULATORY_CARE_PROVIDER_SITE_OTHER): Payer: 59 | Admitting: Internal Medicine

## 2013-02-21 VITALS — BP 173/94 | HR 95 | Temp 98.4°F | Ht 67.0 in | Wt 184.8 lb

## 2013-02-21 DIAGNOSIS — I10 Essential (primary) hypertension: Secondary | ICD-10-CM

## 2013-02-21 DIAGNOSIS — J069 Acute upper respiratory infection, unspecified: Secondary | ICD-10-CM | POA: Insufficient documentation

## 2013-02-21 DIAGNOSIS — E1149 Type 2 diabetes mellitus with other diabetic neurological complication: Secondary | ICD-10-CM

## 2013-02-21 DIAGNOSIS — R011 Cardiac murmur, unspecified: Secondary | ICD-10-CM

## 2013-02-21 DIAGNOSIS — N183 Chronic kidney disease, stage 3 unspecified: Secondary | ICD-10-CM

## 2013-02-21 DIAGNOSIS — E785 Hyperlipidemia, unspecified: Secondary | ICD-10-CM

## 2013-02-21 DIAGNOSIS — I129 Hypertensive chronic kidney disease with stage 1 through stage 4 chronic kidney disease, or unspecified chronic kidney disease: Secondary | ICD-10-CM

## 2013-02-21 LAB — LIPID PANEL
Cholesterol: 277 mg/dL — ABNORMAL HIGH (ref 0–200)
HDL: 49 mg/dL (ref >39)
LDL Cholesterol: 187 mg/dL — ABNORMAL HIGH (ref 0–99)
TRIGLYCERIDES: 203 mg/dL — AB (ref <150)
Total CHOL/HDL Ratio: 5.7 Ratio
VLDL: 41 mg/dL — ABNORMAL HIGH (ref 0–40)

## 2013-02-21 LAB — COMPREHENSIVE METABOLIC PANEL
ALBUMIN: 2.9 g/dL — AB (ref 3.5–5.2)
ALT: 8 U/L (ref 0–35)
AST: 10 U/L (ref 0–37)
Alkaline Phosphatase: 93 U/L (ref 39–117)
BUN: 19 mg/dL (ref 6–23)
CALCIUM: 8.5 mg/dL (ref 8.4–10.5)
CHLORIDE: 97 meq/L (ref 96–112)
CO2: 25 meq/L (ref 19–32)
Creat: 1.44 mg/dL — ABNORMAL HIGH (ref 0.50–1.10)
Glucose, Bld: 511 mg/dL (ref 70–99)
Potassium: 4.7 mEq/L (ref 3.5–5.3)
Sodium: 132 mEq/L — ABNORMAL LOW (ref 135–145)
Total Bilirubin: 0.3 mg/dL (ref 0.3–1.2)
Total Protein: 6.4 g/dL (ref 6.0–8.3)

## 2013-02-21 LAB — GLUCOSE, CAPILLARY: GLUCOSE-CAPILLARY: 357 mg/dL — AB (ref 70–99)

## 2013-02-21 LAB — POCT GLYCOSYLATED HEMOGLOBIN (HGB A1C)

## 2013-02-21 NOTE — Progress Notes (Signed)
Subjective:   Patient ID: Jade Bond female   DOB: Aug 23, 1965 48 y.o.   MRN: [id number]  Chief Complaint  Patient presents with  . Sore Throat    Since 02/20/13 AM - sore throat , runny nose, nonproductive cough and body aches.    HPI: Jade Bond is a 48 y.o. woman with history of HTN, HLD, allergic rhinitis and peripheral neuropathy who presents for an acute visit.   Sick since yesterday, daughter was sick last week, patient with similar symptoms now; no flu shot this year.  Tuesday got a tickle in throat - took alkaseltzer plus and slept well. Yesterday went to work, but when she got up, she felt there was a cotton ball in her throat, body ache head to toe, nose running with sneezing, decreased appetite -tried cream of chicken soup but vomited this up, tried tylenol then improved appetite and had meatloaf +veggies.  Drinking hot tea, water, ginger ale - staying hydrated. No fever (tactile, hasn't measured with thermometer ).  She stays cold, aside from recent illness. Took nyquil last night, slept well. Cough without sputum production. No mouth sores. +sinus congestion throughout head. No ear pain.  Similar to sinus infection this time last year.   S/p tonsillectomy   She works in [employer] at [employer].  Lost benefits last year, benefits to restart this year, has been off meds 6 months ago, but restarted meds 2 weeks ago.   Review of Systems: HEENT: Per HPI Respiratory: Denies SOB, DOE, chest tightness, and wheezing.  Cardiovascular: Denies chest pain, palpitations and leg swelling.  Gastrointestinal: Denies abdominal pain, diarrhea,blood in stool and abdominal distention. +constipated Genitourinary: Denies dysuria, urgency, frequency, hematuria, flank pain and difficulty urinating.  Musculoskeletal: Denies back pain, joint swelling, arthralgias and gait problem.  Skin: Denies pallor, rash and wound.  Neurological: Denies dizziness, seizures, syncope, weakness, lightheadedness,  numbness and headaches.    Past Medical History  Diagnosis Date  . Hypertension   . Hyperlipidemia   . Angina   . Diabetes mellitus    Current Outpatient Prescriptions  Medication Sig Dispense Refill  . aspirin 81 MG tablet Take 81 mg by mouth daily.      Marland Kitchen atorvastatin (LIPITOR) 10 MG tablet Take 1 tablet (10 mg total) by mouth daily.  31 tablet  11  . atorvastatin (LIPITOR) 10 MG tablet Take 1 tablet (10 mg total) by mouth daily.  130 tablet  0  . insulin aspart (NOVOLOG PENFILL) 100 UNIT/ML injection Inject 4-10 Units into the skin 3 (three) times daily before meals. Sliding scale as directed. Pt takes anywhere from 4-10 units when blood sugar is >150.      Marland Kitchen insulin glargine (LANTUS) 100 UNIT/ML injection Inject 25 Units into the skin at bedtime. 25 units in the am and 30 units pm      . insulin glargine (LANTUS) 100 UNIT/ML injection Inject 0.25 mLs (25 Units total) into the skin at bedtime.  10 mL  0  . insulin lispro (HUMALOG) 100 UNIT/ML injection As per your sliding scale and carbohydrate counting  10 mL  11  . lisinopril (PRINIVIL,ZESTRIL) 10 MG tablet Take 1 tablet (10 mg total) by mouth daily.  30 tablet  0  . lisinopril (PRINIVIL,ZESTRIL) 5 MG tablet Take 1 tablet (5 mg total) by mouth daily.  30 tablet  6  . nitroGLYCERIN (NITROSTAT) 0.4 MG SL tablet Place 0.4 mg under the tongue every 5 (five) minutes as needed. For chest pain  No current facility-administered medications for this visit.   Family History  Problem Relation Age of Onset  . Malignant hyperthermia Mother    History   Social History  . Marital Status: Legally Separated    Spouse Name: N/A    Number of Children: N/A  . Years of Education: N/A   Social History Main Topics  . Smoking status: Never Smoker   . Smokeless tobacco: None  . Alcohol Use: No  . Drug Use: No  . Sexual Activity: Yes   Other Topics Concern  . None   Social History Narrative  . None    Objective:  Physical  Exam: Filed Vitals:   02/21/13 0940  BP: 173/94  Pulse: 95  Temp: 98.4 F (36.9 C)  TempSrc: Oral  Height: 5\' 7"  (1.702 m)  Weight: 184 lb 12.8 oz (83.825 kg)  SpO2: 98%   General: appears ill, but no acute distress HEENT: PERRL, EOMI, no scleral icterus, mildly erythematous TM b/l, no frontal sinus tenderness, b/l maxillary sinus tenderness, no LAD, mild pharyngeal erythema without exudate, no conjunctival pallor Cardiac: RRR, + murmur at left 2nd IC space Pulm: clear to auscultation bilaterally, moving normal volumes of air, no respiratory distress Abd: soft, nontender, nondistended, BS present Ext: warm and well perfused, no pedal edema Neuro: alert and oriented X3, cranial nerves II-XII grossly intact  Assessment & Plan:  Case and care discussed with Dr. Lynnae January.  Please see problem oriented charting for further details. Patient to return in 2 weeks for routine HTN & DM follow up

## 2013-02-21 NOTE — Assessment & Plan Note (Addendum)
Likely viral etiology given myalgias. No fever. +sinus tenderness and rhinorrhea, suggestive of acute sinusitis.  Not likely strep throat given +cough, no cervical LAD, no fever). Supportive therapy with rest, fluids and OTC meds (nyquil). RTC if worsening or no improvement over the next week, otherwise return in 2 weeks for routine follow up for HTN and DM.

## 2013-02-21 NOTE — Telephone Encounter (Signed)
Received page from [facility] lab for critical value of blood glucose of 511 per CMP drawn this morning during the pt's visit with Dr. Burnard Bunting. No interventions at this time as the patient has Hg A1C>14% with CBG elevated during her office visit and pt has already been started on home insulin therapy and metformin as stated in Dr. Raenette Rover note.

## 2013-02-21 NOTE — Assessment & Plan Note (Signed)
BP Readings from Last 3 Encounters:  02/21/13 173/94  02/03/13 155/77  12/13/11 135/84    Lab Results  Component Value Date   NA 132* 12/13/2011   K 4.7 12/13/2011   CREATININE 1.20* 12/13/2011    Assessment: Blood pressure control:  elevated Progress toward BP goal:   deteriorated Comments: acute illness  Plan: Medications:  patient restarted lisinopril on her own a few weeks ago, unclear if 5mg  or 10mg ; will readdress this at 2 week follow up for routine visit Educational resources provided: brochure;handout;video

## 2013-02-21 NOTE — Assessment & Plan Note (Addendum)
Given her acute illness and restarting lisinopril a few weeks prior, will check renal function today.

## 2013-02-21 NOTE — Progress Notes (Signed)
Case discussed with Dr. Sharda soon after the resident saw the patient.  We reviewed the resident's history and exam and pertinent patient test results.  I agree with the assessment, diagnosis, and plan of care documented in the resident's note. 

## 2013-02-21 NOTE — Assessment & Plan Note (Addendum)
Lipid profile today. Reports restarting crestor 2 weeks ago.

## 2013-02-21 NOTE — Assessment & Plan Note (Signed)
Patient thinks she may have had a heart murmur as a child.  I heard one today on exam - closely exam again at next visit.  She reports she has had cardiac work ups in the past.

## 2013-02-21 NOTE — Patient Instructions (Signed)
-  Be sure to stay hydrated. I think you have a viral illness that will take a few days to resolve.  If you are no better or worsening over the next week, please call and let us know  Please be sure to bring all of your medications with you to every visit.  Should you have any new or worsening symptoms, please be sure to call the clinic at [phone].  Upper Respiratory Infection, Adult An upper respiratory infection (URI) is also known as the common cold. It is often caused by a type of germ (virus). Colds are easily spread (contagious). You can pass it to others by kissing, coughing, sneezing, or drinking out of the same glass. Usually, you get better in 1 or 2 weeks.  HOME CARE   Only take medicine as told by your doctor.  Use a warm mist humidifier or breathe in steam from a hot shower.  Drink enough water and fluids to keep your pee (urine) clear or pale yellow.  Get plenty of rest.  Return to work when your temperature is back to normal or as told by your doctor. You may use a face mask and wash your hands to stop your cold from spreading. GET HELP RIGHT AWAY IF:   After the first few days, you feel you are getting worse.  You have questions about your medicine.  You have chills, shortness of breath, or brown or red spit (mucus).  You have yellow or brown snot (nasal discharge) or pain in the face, especially when you bend forward.  You have a fever, puffy (swollen) neck, pain when you swallow, or white spots in the back of your throat.  You have a bad headache, ear pain, sinus pain, or chest pain.  You have a high-pitched whistling sound when you breathe in and out (wheezing).  You have a lasting cough or cough up blood.  You have sore muscles or a stiff neck. MAKE SURE YOU:   Understand these instructions.  Will watch your condition.  Will get help right away if you are not doing well or get worse. Document Released: 07/06/2007 Document Revised: 04/11/2011 Document  Reviewed: 05/24/2010 [facility] Patient Information 2014 [place], [place].

## 2013-02-21 NOTE — Assessment & Plan Note (Signed)
Lab Results  Component Value Date   HGBA1C >14.0 02/21/2013   HGBA1C >14.0 11/24/2011   HGBA1C 15.4* 03/18/2011     Assessment: Diabetes control:  uncontrolled Progress toward A1C goal:   unchanged Comments: patient reports having restarted insulin therapy and metformin therapy 2 weeks ago  Plan: Medications:  continue current medications Home glucose monitoring: Frequency:  TID Timing:  before meals Instruction/counseling given: reminded to bring blood glucose meter & log to each visit and reminded to bring medications to each visit Educational resources provided: brochure;handout Self management tools provided:   Other plans: check renal fxn, if Cr > 1.4, will have patient d/c metformin

## 2013-02-22 ENCOUNTER — Telehealth: Payer: Self-pay | Admitting: *Deleted

## 2013-02-22 NOTE — Telephone Encounter (Signed)
I called patient -- she was sleeping, her girlfriend, Hassan Rowan answered the phone.  Patient would not be expected to feel better just yet, I explained a virus may take about a week to resolve - If she still has symptoms on Monday, she should call back, as that would be approximately a week since symptoms began.  I advised lots of rest and fluids. Thanks!

## 2013-02-22 NOTE — Telephone Encounter (Signed)
Thanks Bonnita Nasuti, I'll defer this to [facility] if that's OK, since I did not see the patient recently.  Lesly Dukes, MD  [email or web address] Pager # [phone] Office # [phone]

## 2013-02-22 NOTE — Telephone Encounter (Signed)
Pt calls leaves a message that she is worse today and needs abx, states on message that she was told to call today if she was not better and abx would be called to pharm

## 2013-03-01 ENCOUNTER — Other Ambulatory Visit: Payer: Self-pay

## 2013-05-12 ENCOUNTER — Encounter (HOSPITAL_COMMUNITY): Payer: Self-pay | Admitting: Emergency Medicine

## 2013-05-12 ENCOUNTER — Emergency Department (HOSPITAL_COMMUNITY)
Admission: EM | Admit: 2013-05-12 | Discharge: 2013-05-13 | Disposition: A | Discharge status: Home / Self Care | Payer: 59 | Attending: Emergency Medicine | Admitting: Emergency Medicine

## 2013-05-12 DIAGNOSIS — I10 Essential (primary) hypertension: Secondary | ICD-10-CM | POA: Insufficient documentation

## 2013-05-12 DIAGNOSIS — E860 Dehydration: Secondary | ICD-10-CM | POA: Insufficient documentation

## 2013-05-12 DIAGNOSIS — R509 Fever, unspecified: Secondary | ICD-10-CM

## 2013-05-12 DIAGNOSIS — Z794 Long term (current) use of insulin: Secondary | ICD-10-CM | POA: Insufficient documentation

## 2013-05-12 DIAGNOSIS — Z7982 Long term (current) use of aspirin: Secondary | ICD-10-CM | POA: Insufficient documentation

## 2013-05-12 DIAGNOSIS — R519 Headache, unspecified: Secondary | ICD-10-CM

## 2013-05-12 DIAGNOSIS — R350 Frequency of micturition: Secondary | ICD-10-CM | POA: Insufficient documentation

## 2013-05-12 DIAGNOSIS — Z8679 Personal history of other diseases of the circulatory system: Secondary | ICD-10-CM | POA: Insufficient documentation

## 2013-05-12 DIAGNOSIS — R630 Anorexia: Secondary | ICD-10-CM | POA: Insufficient documentation

## 2013-05-12 DIAGNOSIS — R51 Headache: Secondary | ICD-10-CM | POA: Insufficient documentation

## 2013-05-12 DIAGNOSIS — E119 Type 2 diabetes mellitus without complications: Secondary | ICD-10-CM | POA: Insufficient documentation

## 2013-05-12 DIAGNOSIS — Z3202 Encounter for pregnancy test, result negative: Secondary | ICD-10-CM | POA: Insufficient documentation

## 2013-05-12 DIAGNOSIS — Z79899 Other long term (current) drug therapy: Secondary | ICD-10-CM | POA: Insufficient documentation

## 2013-05-12 DIAGNOSIS — E785 Hyperlipidemia, unspecified: Secondary | ICD-10-CM | POA: Insufficient documentation

## 2013-05-12 LAB — CBG MONITORING, ED: GLUCOSE-CAPILLARY: 166 mg/dL — AB (ref 70–99)

## 2013-05-12 MED ORDER — SODIUM CHLORIDE 0.9 % IV SOLN: 1000.0000 mL | INTRAVENOUS | Status: DC

## 2013-05-12 MED ORDER — SODIUM CHLORIDE 0.9 % IV SOLN
1000.0000 mL | Freq: Once | INTRAVENOUS | Status: AC
  Administered 2013-05-13: 1000 mL via INTRAVENOUS

## 2013-05-12 MED ORDER — OXYCODONE-ACETAMINOPHEN 5-325 MG PO TABS: 1.0000 | ORAL_TABLET | Freq: Once | ORAL | Status: DC

## 2013-05-12 MED ORDER — ACETAMINOPHEN 325 MG PO TABS
650.0000 mg | ORAL_TABLET | Freq: Once | ORAL | Status: AC
  Administered 2013-05-13: 650 mg via ORAL
  Filled 2013-05-12: qty 2

## 2013-05-12 NOTE — ED Notes (Signed)
Pt states that she has been out of all of her HTN, DM meds x 2 weeks and now feels tired, has a HA, and is hypertensive.

## 2013-05-12 NOTE — ED Notes (Signed)
Pt reports feeling "yucky" and stated she had blurry vision earlier today but that has since resolved.  Pt denies N/V, SOB, or any urinary or GI symptoms.  Pt does endorse fevers and chills.

## 2013-05-12 NOTE — ED Notes (Signed)
Pt ambulated from triage to room without difficulty or distress.

## 2013-05-13 LAB — BASIC METABOLIC PANEL
BUN: 20 mg/dL (ref 6–23)
CALCIUM: 8.7 mg/dL (ref 8.4–10.5)
CO2: 22 meq/L (ref 19–32)
Chloride: 101 mEq/L (ref 96–112)
Creatinine, Ser: 1.55 mg/dL — ABNORMAL HIGH (ref 0.50–1.10)
GFR, EST AFRICAN AMERICAN: 45 mL/min — AB (ref >90)
GFR, EST NON AFRICAN AMERICAN: 39 mL/min — AB (ref >90)
Glucose, Bld: 159 mg/dL — ABNORMAL HIGH (ref 70–99)
Potassium: 4 mEq/L (ref 3.7–5.3)
SODIUM: 136 meq/L — AB (ref 137–147)

## 2013-05-13 LAB — CBG MONITORING, ED: Glucose-Capillary: 149 mg/dL — ABNORMAL HIGH (ref 70–99)

## 2013-05-13 LAB — CBC
HCT: 35.9 % — ABNORMAL LOW (ref 36.0–46.0)
Hemoglobin: 12.5 g/dL (ref 12.0–15.0)
MCH: 31.1 pg (ref 26.0–34.0)
MCHC: 34.8 g/dL (ref 30.0–36.0)
MCV: 89.3 fL (ref 78.0–100.0)
PLATELETS: 311 10*3/uL (ref 150–400)
RBC: 4.02 MIL/uL (ref 3.87–5.11)
RDW: 12.6 % (ref 11.5–15.5)
WBC: 3.8 10*3/uL — ABNORMAL LOW (ref 4.0–10.5)

## 2013-05-13 LAB — URINALYSIS, ROUTINE W REFLEX MICROSCOPIC
Bilirubin Urine: NEGATIVE
Glucose, UA: NEGATIVE mg/dL
KETONES UR: NEGATIVE mg/dL
LEUKOCYTES UA: NEGATIVE
NITRITE: NEGATIVE
PROTEIN: 100 mg/dL — AB
Specific Gravity, Urine: 1.006 (ref 1.005–1.030)
UROBILINOGEN UA: 0.2 mg/dL (ref 0.0–1.0)
pH: 5.5 (ref 5.0–8.0)

## 2013-05-13 LAB — URINE MICROSCOPIC-ADD ON

## 2013-05-13 LAB — PREGNANCY, URINE: Preg Test, Ur: NEGATIVE

## 2013-05-13 NOTE — ED Provider Notes (Signed)
CSN: [id number]     Arrival date & time 05/12/13  2043 History   First MD Initiated Contact with Patient 05/12/13 2336     Chief Complaint  Patient presents with  . Headache  . Fatigue      HPI Patient presents the emergency department because of generalized weakness and headache and fever today.  She states she's had generalized malaise.  She's had some anorexia.  She denies abdominal pain.  No nausea or vomiting.  No diarrhea.  She denies cough or congestion or shortness of breath.  She has had some urinary frequency.  Symptoms are mild in severity.   Past Medical History  Diagnosis Date  . Hypertension   . Hyperlipidemia   . Angina   . Diabetes mellitus    Past Surgical History  Procedure Laterality Date  . Tonsillectomy  1980  . Cardiac catheterization  ~ 2008    negative, no intervention needed  . Tubal ligation  1995   Family History  Problem Relation Age of Onset  . Malignant hyperthermia Mother    History  Substance Use Topics  . Smoking status: Never Smoker   . Smokeless tobacco: Not on file  . Alcohol Use: No   OB History   Grav Para Term Preterm Abortions TAB SAB Ect Mult Living                 Review of Systems  All other systems reviewed and are negative.     Allergies  Review of patient's allergies indicates no known allergies.  Home Medications   Current Outpatient Rx  Name  Route  Sig  Dispense  Refill  . acetaminophen (TYLENOL) 500 MG tablet   Oral   Take 1,000 mg by mouth every 6 (six) hours as needed (headache).         Marland Kitchen aspirin 81 MG tablet   Oral   Take 81 mg by mouth daily.         Marland Kitchen atorvastatin (LIPITOR) 10 MG tablet   Oral   Take 1 tablet (10 mg total) by mouth daily.   31 tablet   11   . insulin aspart (NOVOLOG PENFILL) 100 UNIT/ML injection   Subcutaneous   Inject 4-10 Units into the skin 3 (three) times daily before meals. Sliding scale as directed. Pt takes anywhere from 4-10 units when blood sugar is >150.          Marland Kitchen insulin glargine (LANTUS) 100 UNIT/ML injection   Subcutaneous   Inject 25 Units into the skin at bedtime. 25 units in the am and 30 units pm         . lisinopril (PRINIVIL,ZESTRIL) 10 MG tablet   Oral   Take 1 tablet (10 mg total) by mouth daily.   30 tablet   0   . nitroGLYCERIN (NITROSTAT) 0.4 MG SL tablet   Sublingual   Place 0.4 mg under the tongue every 5 (five) minutes as needed. For chest pain          BP 147/76  Pulse 82  Temp(Src) 98.2 F (36.8 C) (Oral)  Resp 16  SpO2 98%  LMP 05/10/2013 Physical Exam  Nursing note and vitals reviewed. Constitutional: She is oriented to person, place, and time. She appears well-developed and well-nourished. No distress.  HENT:  Head: Normocephalic and atraumatic.  Eyes: EOM are normal.  Neck: Normal range of motion.  Cardiovascular: Normal rate, regular rhythm and normal heart sounds.   Pulmonary/Chest: Effort normal and breath  sounds normal.  Abdominal: Soft. She exhibits no distension. There is no tenderness.  Musculoskeletal: Normal range of motion.  Neurological: She is alert and oriented to person, place, and time.  Skin: Skin is warm and dry.  Psychiatric: She has a normal mood and affect. Judgment normal.    ED Course  Procedures (including critical care time) Labs Review Labs Reviewed  CBC - Abnormal; Notable for the following:    WBC 3.8 (*)    HCT 35.9 (*)    All other components within normal limits  BASIC METABOLIC PANEL - Abnormal; Notable for the following:    Sodium 136 (*)    Glucose, Bld 159 (*)    Creatinine, Ser 1.55 (*)    GFR calc non Af Amer 39 (*)    GFR calc Af Amer 45 (*)    All other components within normal limits  URINALYSIS, ROUTINE W REFLEX MICROSCOPIC - Abnormal; Notable for the following:    Hgb urine dipstick SMALL (*)    Protein, ur 100 (*)    All other components within normal limits  URINE MICROSCOPIC-ADD ON - Abnormal; Notable for the following:    Squamous  Epithelial / LPF FEW (*)    All other components within normal limits  CBG MONITORING, ED - Abnormal; Notable for the following:    Glucose-Capillary 166 (*)    All other components within normal limits  PREGNANCY, URINE   Imaging Review No results found.   EKG Interpretation None      MDM   Final diagnoses:  Fever  Dehydration  Headache    Patient feels much better after IV fluids.  Baseline renal insufficiency for the patient.  Suspect viral syndrome with mild volume depletion.  Headache resolved.  Normal neuro exam.  No indication for head CT.    Hoy Morn, MD 05/13/13 [phone]

## 2013-05-15 ENCOUNTER — Encounter: Payer: Self-pay | Admitting: Internal Medicine

## 2013-05-15 ENCOUNTER — Ambulatory Visit (INDEPENDENT_AMBULATORY_CARE_PROVIDER_SITE_OTHER): Payer: 59 | Admitting: Internal Medicine

## 2013-05-15 VITALS — BP 174/97 | HR 82 | Temp 97.3°F | Ht 67.0 in | Wt 188.0 lb

## 2013-05-15 DIAGNOSIS — I10 Essential (primary) hypertension: Secondary | ICD-10-CM

## 2013-05-15 DIAGNOSIS — E1149 Type 2 diabetes mellitus with other diabetic neurological complication: Secondary | ICD-10-CM

## 2013-05-15 DIAGNOSIS — N183 Chronic kidney disease, stage 3 unspecified: Secondary | ICD-10-CM

## 2013-05-15 DIAGNOSIS — E785 Hyperlipidemia, unspecified: Secondary | ICD-10-CM

## 2013-05-15 DIAGNOSIS — I129 Hypertensive chronic kidney disease with stage 1 through stage 4 chronic kidney disease, or unspecified chronic kidney disease: Secondary | ICD-10-CM

## 2013-05-15 LAB — POCT GLYCOSYLATED HEMOGLOBIN (HGB A1C): Hemoglobin A1C: >14

## 2013-05-15 LAB — GLUCOSE, CAPILLARY: Glucose-Capillary: 167 mg/dL — ABNORMAL HIGH (ref 70–99)

## 2013-05-15 MED ORDER — ASPIRIN 81 MG PO TBEC: 81.0000 mg | DELAYED_RELEASE_TABLET | Freq: Every day | ORAL | Status: DC

## 2013-05-15 MED ORDER — LISINOPRIL 10 MG PO TABS: 10.0000 mg | ORAL_TABLET | Freq: Every day | ORAL | Status: DC

## 2013-05-15 MED ORDER — ATORVASTATIN CALCIUM 40 MG PO TABS: 40.0000 mg | ORAL_TABLET | Freq: Every day | ORAL | Status: DC

## 2013-05-15 MED ORDER — GLUCOSE BLOOD VI STRP: ORAL_STRIP | Status: DC

## 2013-05-15 MED ORDER — INSULIN PEN NEEDLE 32G X 4 MM MISC: Status: DC

## 2013-05-15 MED ORDER — ACCU-CHEK SOFT TOUCH LANCETS MISC: Status: DC

## 2013-05-15 MED ORDER — ACCU-CHEK AVIVA DEVI: Status: DC

## 2013-05-15 NOTE — Assessment & Plan Note (Addendum)
Follow up urine micro-albumin/creatinine ratio.  BMET    Component Value Date/Time   NA 136* 05/12/2013 2354   K 4.0 05/12/2013 2354   CL 101 05/12/2013 2354   CO2 22 05/12/2013 2354   GLUCOSE 159* 05/12/2013 2354   BUN 20 05/12/2013 2354   CREATININE 1.55* 05/12/2013 2354   CREATININE 1.44* 02/21/2013 1038   CALCIUM 8.7 05/12/2013 2354   GFRNONAA 39* 05/12/2013 2354   GFRNONAA 48* 11/24/2011 1634   GFRAA 45* 05/12/2013 2354   GFRAA 55* 11/24/2011 1634

## 2013-05-15 NOTE — Progress Notes (Signed)
Subjective:    Patient ID: Jade Bond, female    DOB: 04-21-1965, 48 y.o.   MRN: [id number]  HPI  Jade Bond is a 48 y.o. woman with history of uncontrolled DM2 with neurologic manifestations, HTN, HLD, allergic rhinitis who presents for routine follow up. She has no complaints today.  DM2 - Last A1c >14 in 01/15. It is also >14 today. Cr 1.55 on 4/12. Metformin was discontinued in January. She should be taking Lantus 25 units in the am and 30 units in the pm, and a sliding scale Novolog of 4-10 units. She tells me she is taking Lantus 25 units in the pm and a sliding scale Novolog of 2-10 units. Her glucometer is broken today. She would like a new prescription for onw. She is due for a foot exam, eye exam, and urine microalbumin/Cr ratio.   HTN - 174/97 today. She should be taking Lisinopril 10mg  daily, but admits she has not taken it for 2 months. She was given refills in January by Dr. Burnard Bunting but decided not to refill it.  HLD - Should be taking Lipitor 10mg  daily but admits she has not taken it for 2 months.     Current Outpatient Prescriptions on File Prior to Visit  Medication Sig Dispense Refill  . acetaminophen (TYLENOL) 500 MG tablet Take 1,000 mg by mouth every 6 (six) hours as needed (headache).      Marland Kitchen aspirin 81 MG tablet Take 81 mg by mouth daily.      Marland Kitchen atorvastatin (LIPITOR) 10 MG tablet Take 1 tablet (10 mg total) by mouth daily.  31 tablet  11  . insulin aspart (NOVOLOG PENFILL) 100 UNIT/ML injection Inject 4-10 Units into the skin 3 (three) times daily before meals. Sliding scale as directed. Pt takes anywhere from 4-10 units when blood sugar is >150.      Marland Kitchen insulin glargine (LANTUS) 100 UNIT/ML injection Inject 25 Units into the skin at bedtime. 25 units in the am and 30 units pm      . lisinopril (PRINIVIL,ZESTRIL) 10 MG tablet Take 1 tablet (10 mg total) by mouth daily.  30 tablet  0  . nitroGLYCERIN (NITROSTAT) 0.4 MG SL tablet Place 0.4 mg under the tongue every  5 (five) minutes as needed. For chest pain        Review of Systems  Constitutional: Negative for fever and chills.  Eyes: Negative for visual disturbance.  Respiratory: Negative for cough and shortness of breath.   Cardiovascular: Negative for chest pain.  Gastrointestinal: Negative for abdominal pain.  Genitourinary: Negative for dysuria.  Musculoskeletal: Negative for myalgias.  Skin: Negative for rash.  Neurological: Negative for light-headedness, numbness and headaches.      Objective:   Physical Exam  Constitutional: She is oriented to person, place, and time. She appears well-developed and well-nourished. No distress.  HENT:  Head: Normocephalic and atraumatic.  Eyes: Conjunctivae and EOM are normal. Pupils are equal, round, and reactive to light.  Neck: Normal range of motion. Neck supple.  Cardiovascular: Normal rate, regular rhythm and normal heart sounds.  Exam reveals no gallop and no friction rub.   No murmur heard. Pulmonary/Chest: Effort normal and breath sounds normal. No respiratory distress. She has no wheezes. She has no rales. She exhibits no tenderness.  Abdominal: Soft. Bowel sounds are normal. She exhibits no distension.  Musculoskeletal: Normal range of motion. She exhibits no edema and no tenderness.  Feet: DP pulses 2+ and symmetric. No skin breakdown.  Neurological: She is alert and oriented to person, place, and time. No cranial nerve deficit.  Skin: Skin is warm and dry.  Psychiatric: She has a normal mood and affect.    Filed Vitals:   05/15/13 1535  BP: 174/97  Pulse: 82  Temp: 97.3 F (36.3 C)       Assessment & Plan:   Please see problem based charting.

## 2013-05-15 NOTE — Assessment & Plan Note (Addendum)
BP Readings from Last 3 Encounters:  05/15/13 174/97  05/13/13 151/70  02/21/13 173/94    Lab Results  Component Value Date   NA 136* 05/12/2013   K 4.0 05/12/2013   CREATININE 1.55* 05/12/2013    Assessment: Blood pressure control: moderately elevated Progress toward BP goal:  deteriorated in the setting of medication noncompliance  Plan: Medications:  Continue lisinopril 10 mg daily, I counseled the patient on this medication and refilled it for her. Educational resources provided: brochure [employer]; Understanding and Managing HBP.) Self management tools provided: home blood pressure logbook

## 2013-05-15 NOTE — Assessment & Plan Note (Addendum)
10-Year ASCVD Risk is 20%. High-intensity statin therapy is reasonable.  - Increase Lipitor to 40mg  daily and counseled patient on importance of this medication to prevent heart disease

## 2013-05-15 NOTE — Patient Instructions (Signed)
Thank you for your visit. - Please take your Lantus as follows: 25 units in the morning, and 30 units in the evening. Continue your sliding scale NovoLog of 2-10 units. - I have reordered your glucometer, strips, and lancets. Please call the clinic at [phone] if you have any issues with this prescription. Sometimes they need additional information. - Please take your lisinopril at 10 mg daily. This is a blood pressure medicine and also helps protect your kidneys. - Please take Lipitor 40 mg daily. This is a dose increase from your prior level. This is a cholesterol medicine to help protect your heart. - Please check your sugars 4 times a day, before meals and at bedtime. - Bring your glucometer to your next appointment. - Please return to see me in 3-4 weeks. I would also like you to see Butch Penny, our diabetes educator.

## 2013-05-15 NOTE — Progress Notes (Signed)
I have discussed the medications, problem list, and management of this patient with resident physician Dr Lesly Dukes and concur with her treatment plan.  This patient has major compliance issues. Dr Lucila [place] spent extra time with her reviewing the indications for each of her medications and the importance of taking her meds on a regular basis to forestall complications of her diabetes. Murriel Hopper, MD, [facility]/Internal Medicine

## 2013-05-15 NOTE — Assessment & Plan Note (Addendum)
Lab Results  Component Value Date   HGBA1C >14.0 05/15/2013   HGBA1C >14.0 02/21/2013   HGBA1C >14.0 11/24/2011     Assessment: Diabetes control: poor control (HgbA1C >9%) Progress toward A1C goal:  deteriorated Comments: Patient has not been taking her insulin as prescribed.  Plan: Medications:  continue current medications Home glucose monitoring: Frequency:   4 times per day Timing:   before meals and at night Instruction/counseling given: reminded to bring blood glucose meter & log to each visit and discussed diet Educational resources provided: brochure (Managing Your Type 2 Diabets) Other plans: Patient has a long history of uncontrolled diabetes secondary to medication noncompliance. We had a frank discussion today about the consequences of her uncontrolled diabetes, including kidney failure. She is willing to followup closely and to meet with Butch Penny. Her glucometer was broken today. I reordered her Accu-Chek meter, strips, and lancets. She will measure her blood sugars 4 times a day and bring her meter to her next appointment. We will discuss getting an eye exam at her next appointment.

## 2013-05-16 LAB — MICROALBUMIN / CREATININE URINE RATIO
Creatinine, Urine: 98.9 mg/dL
Microalb Creat Ratio: 2817.5 mg/g — ABNORMAL HIGH (ref 0.0–30.0)
Microalb, Ur: 278.65 mg/dL — ABNORMAL HIGH (ref 0.00–1.89)

## 2013-06-01 ENCOUNTER — Telehealth: Payer: Self-pay | Admitting: Internal Medicine

## 2013-06-01 NOTE — Telephone Encounter (Signed)
X 2 days legs swelling and shooting pain in big toe.  Patient states she will come to the ED instead of waiting until Monday for an appt  Aundra Dubin MD

## 2013-06-03 ENCOUNTER — Ambulatory Visit (INDEPENDENT_AMBULATORY_CARE_PROVIDER_SITE_OTHER): Payer: 59 | Admitting: Internal Medicine

## 2013-06-03 ENCOUNTER — Encounter: Payer: Self-pay | Admitting: Internal Medicine

## 2013-06-03 ENCOUNTER — Encounter: Payer: Self-pay | Admitting: *Deleted

## 2013-06-03 VITALS — BP 159/86 | HR 76 | Temp 98.7°F | Ht 67.0 in | Wt 194.7 lb

## 2013-06-03 DIAGNOSIS — I10 Essential (primary) hypertension: Secondary | ICD-10-CM

## 2013-06-03 DIAGNOSIS — N183 Chronic kidney disease, stage 3 unspecified: Secondary | ICD-10-CM

## 2013-06-03 DIAGNOSIS — M79676 Pain in unspecified toe(s): Secondary | ICD-10-CM | POA: Insufficient documentation

## 2013-06-03 DIAGNOSIS — R600 Localized edema: Secondary | ICD-10-CM | POA: Insufficient documentation

## 2013-06-03 DIAGNOSIS — M79609 Pain in unspecified limb: Secondary | ICD-10-CM

## 2013-06-03 DIAGNOSIS — R609 Edema, unspecified: Secondary | ICD-10-CM

## 2013-06-03 DIAGNOSIS — I129 Hypertensive chronic kidney disease with stage 1 through stage 4 chronic kidney disease, or unspecified chronic kidney disease: Secondary | ICD-10-CM

## 2013-06-03 LAB — BASIC METABOLIC PANEL
BUN: 22 mg/dL (ref 6–23)
CO2: 25 mEq/L (ref 19–32)
Calcium: 8.4 mg/dL (ref 8.4–10.5)
Chloride: 108 mEq/L (ref 96–112)
Creat: 1.5 mg/dL — ABNORMAL HIGH (ref 0.50–1.10)
GLUCOSE: 111 mg/dL — AB (ref 70–99)
POTASSIUM: 4.7 meq/L (ref 3.5–5.3)
SODIUM: 139 meq/L (ref 135–145)

## 2013-06-03 NOTE — Assessment & Plan Note (Signed)
The patient is a one-week history of bilateral lower extremity edema.  I believe there is likely a component of chronic venous insufficiency, with dilated superficial veins seen on bilateral feet. However, the fact that this just happened in the last week, after restarting her medications, including lisinopril, makes me concerned about acute worsening of renal function. CHF is unlikely, with no symptoms of orthopnea or PND (murmur stable since childhood). -Treat chronic venous insufficiency with compression stockings -Check BMET for worsening of creatinine Will have patient sign release of records form, and attempt to obtain last stress test results from [place]. Given her history of medication noncompliance, though, it is possible that long-standing hypertension has caused some diastolic dysfunction. If symptoms do not improve, we can consider obtaining echocardiogram

## 2013-06-03 NOTE — Progress Notes (Signed)
Pt walked into clinic with c/o swelling ankles for one week. More swelling noted duriing the day, improves at night when feet elevated. Also has tingling to big toe when area is swollen for past 2 days.  This is the first time pt has experienced this. Hx DM.  Will see this AM

## 2013-06-03 NOTE — Assessment & Plan Note (Signed)
I believe the patient's left first MTP pain may have been due to physical stress from the lower extremity edema. Differential also includes diabetic neuropathy, given its burning sensation and her history of poorly controlled diabetes, but I believe this is less likely given its acute onset and resolution. Gout is similarly unlikely to resolve so quickly without intervention.  The patient is currently without pain. -Treat edema per above

## 2013-06-03 NOTE — Patient Instructions (Signed)
General Instructions: The swelling in your legs likely represents a condition called Chronic Venous Insufficiency (see info below) -I recommend buying a pair of thigh-high compression stockings, to treat the swelling in your legs -we are checking labs to make sure this doesn't represent worsening kidney function -It is unlikely that this represents Heart disease.  We will try to get records from your last stress test and catheterization.  If you develop symptoms of difficulty breathing when lying flat, or waking up in the middle of the night gasping for air, we may need to repeat an Echocardiogram  The pain in your toe was likely worsened by the swelling in your legs.  We will treat the swelling in your legs to try to prevent this from happening again.  Your blood pressure is elevated today.  If your kidney function is stable on your labs, we will likely need to increase your Lisinopril dose.  We will call you after we get your lab results to discuss this.  Please return for a follow-up visit in 3-4 weeks for re-evaluation.   Treatment Goals:  Goals (1 Years of Data) as of 06/03/13   None      Progress Toward Treatment Goals:  Treatment Goal 06/03/2013  Hemoglobin A1C unchanged  Blood pressure improved    Self Care Goals & Plans:  Self Care Goal 05/15/2013  Manage my medications take my medicines as prescribed  Monitor my health keep track of my blood glucose; bring my glucose meter and log to each visit; check my feet daily  Eat healthy foods eat more vegetables; eat foods that are low in salt; eat baked foods instead of fried foods  Be physically active find an activity I enjoy; take a walk every day    No flowsheet data found.   Care Management & Community Referrals:  Referral 06/03/2013  Referrals made for care management support none needed      Venous Stasis or Chronic Venous Insufficiency Chronic venous insufficiency, also called venous stasis, is a condition that  affects the veins in the legs. The condition prevents blood from being pumped through these veins effectively. Blood may no longer be pumped effectively from the legs back to the heart. This condition can range from mild to severe. With proper treatment, you should be able to continue with an active life. CAUSES  Chronic venous insufficiency occurs when the vein walls become stretched, weakened, or damaged or when valves within the vein are damaged. Some common causes of this include:  High blood pressure inside the veins (venous hypertension).  Increased blood pressure in the leg veins from long periods of sitting or standing.  A blood clot that blocks blood flow in a vein (deep vein thrombosis).  Inflammation of a superficial vein (phlebitis) that causes a blood clot to form. RISK FACTORS Various things can make you more likely to develop chronic venous insufficiency, including:  Family history of this condition.  Obesity.  Pregnancy.  Sedentary lifestyle.  Smoking.  Jobs requiring long periods of standing or sitting in one place.  Being a certain age. Women in their 40s and 73s and men in their 22s are more likely to develop this condition. SIGNS AND SYMPTOMS  Symptoms may include:   Varicose veins.  Skin breakdown or ulcers.  Reddened or discolored skin on the leg.  Draxton Luu, smooth, tight, and painful skin just above the ankle, usually on the inside surface (lipodermatosclerosis).  Swelling. DIAGNOSIS  To diagnose this condition, your health care provider  will take a medical history and do a physical exam. The following tests may be ordered to confirm the diagnosis:  Duplex ultrasound A procedure that produces a picture of a blood vessel and nearby organs and also provides information on blood flow through the blood vessel.  Plethysmography A procedure that tests blood flow.  A venogram, or venography A procedure used to look at the veins using X-ray and  dye. TREATMENT The goals of treatment are to help you return to an active life and to minimize pain or disability. Treatment will depend on the severity of the condition. Medical procedures may be needed for severe cases. Treatment options may include:   Use of compression stockings. These can help with symptoms and lower the chances of the problem getting worse, but they do not cure the problem.  Sclerotherapy A procedure involving an injection of a material that "dissolves" the damaged veins. Other veins in the network of blood vessels take over the function of the damaged veins.  Surgery to remove the vein or cut off blood flow through the vein (vein stripping or laser ablation surgery).  Surgery to repair a valve. HOME CARE INSTRUCTIONS   Wear compression stockings as directed by your health care provider.  Only take over-the-counter or prescription medicines for pain, discomfort, or fever as directed by your health care provider.  Follow up with your health care provider as directed. SEEK MEDICAL CARE IF:   You have redness, swelling, or increasing pain in the affected area.  You see a red streak or line that extends up or down from the affected area.  You have a breakdown or loss of skin in the affected area, even if the breakdown is small.  You have an injury to the affected area. SEEK IMMEDIATE MEDICAL CARE IF:   You have an injury and open wound in the affected area.  Your pain is severe and does not improve with medicine.  You have sudden numbness or weakness in the foot or ankle below the affected area, or you have trouble moving your foot or ankle.  You have a fever or persistent symptoms for more than 2 3 days.  You have a fever and your symptoms suddenly get worse. MAKE SURE YOU:   Understand these instructions.  Will watch your condition.  Will get help right away if you are not doing well or get worse. Document Released: 05/23/2006 Document Revised:  11/07/2012 Document Reviewed: 09/24/2012 [facility] Patient Information 2014 [place].

## 2013-06-03 NOTE — Progress Notes (Signed)
Case discussed with Dr. Brown at the time of the visit.  We reviewed the resident's history and exam and pertinent patient test results.  I agree with the assessment, diagnosis, and plan of care documented in the resident's note. 

## 2013-06-03 NOTE — Progress Notes (Signed)
HPI The patient is a 48 y.o. female with a history of DM2, HTN, HL, presenting for leg swelling.  The patient notes a 1-week history of bilateral lower leg swelling.  Swelling is worse at the end of the day after being on her feet all day (job involves standing all day), and improves after lying down at night.  She notes no changes in her diet recently, including no increase in salty foods.  She notes no orthopnea, PND.  She was previously non-compliant with most of her medications, and now is compliant.  The patient has a history of having a stress test, triggered by left arm pain with exertion, which was followed by a cardiac catheterization, both of which were normal, 4-5 years ago which occurred [employer], [place], [place].  The patient notes that 2 days ago, while her feet were very swollen, she developed acute left 1st MTP pain, described as an intense "burning" pain.  She noted no erythema around the joint.  The pain lasted several hours, then resolved, and has not recurred since that time.  The patient notes that since she has re-started taking insulin as prescribed, she has seen fasting CBG's in the 110-140's, and pre-lunch blood sugars in the 190's.    The patient's BP is improved from last visit, but still elevated today.  ROS: General: no fevers, chills, changes in weight, changes in appetite Skin: no rash HEENT: no blurry vision, hearing changes, sore throat Pulm: no dyspnea, coughing, wheezing CV: no chest pain, palpitations, shortness of breath Abd: no abdominal pain, nausea/vomiting, diarrhea/constipation GU: no dysuria, hematuria, polyuria Ext: see HPI Neuro: no weakness, numbness, or tingling  Filed Vitals:   06/03/13 1005  BP: 159/86  Pulse: 76  Temp: 98.7 F (37.1 C)    PEX General: alert, cooperative, and in no apparent distress HEENT: pupils equal round and reactive to light, vision grossly intact, oropharynx clear and non-erythematous  Neck: supple, no  JVD Lungs: clear to ascultation bilaterally, normal work of respiration, no wheezes, rales, ronchi Heart: regular rate and rhythm, grade 2/6 systolic murmur. Abdomen: soft, non-tender, non-distended, normal bowel sounds Extremities: 1+ bilateral LE edema to the level of the knee, with multiple dilated veins in her feet and ankles, with no tenderness to palpation.  Left 1st MTP with no swelling, warmth, erythema, or bony enlargement Neurologic: alert & oriented X3, cranial nerves II-XII intact, strength grossly intact, sensation intact to light touch  Current Outpatient Prescriptions on File Prior to Visit  Medication Sig Dispense Refill  . acetaminophen (TYLENOL) 500 MG tablet Take 1,000 mg by mouth every 6 (six) hours as needed (headache).      Marland Kitchen aspirin 81 MG EC tablet Take 1 tablet (81 mg total) by mouth daily. Swallow whole.  30 tablet  12  . atorvastatin (LIPITOR) 40 MG tablet Take 1 tablet (40 mg total) by mouth daily.  30 tablet  6  . Blood Glucose Monitoring Suppl (ACCU-CHEK AVIVA) device Please use device to check blood sugars 4 times a day, before meals and at bedtime.  1 each  0  . glucose blood test strip Please use with device to check blood sugars 4 times a day, before meals and at bedtime.  100 each  12  . insulin aspart (NOVOLOG PENFILL) 100 UNIT/ML injection Inject 4-10 Units into the skin 3 (three) times daily before meals. Sliding scale as directed. Pt takes anywhere from 4-10 units when blood sugar is >150.      Marland Kitchen  insulin glargine (LANTUS) 100 UNIT/ML injection Inject 25 Units into the skin at bedtime. 25 units in the am and 30 units pm      . Insulin Pen Needle 32G X 4 MM MISC Please use with Lantus pen to administer insulin in the morning and at night.  100 each  11  . Lancets (ACCU-CHEK SOFT TOUCH) lancets Please use with device to check blood sugars 4 times a day, before meals and at bedtime.  100 each  12  . lisinopril (PRINIVIL,ZESTRIL) 10 MG tablet Take 1 tablet (10 mg  total) by mouth daily.  30 tablet  0  . nitroGLYCERIN (NITROSTAT) 0.4 MG SL tablet Place 0.4 mg under the tongue every 5 (five) minutes as needed. For chest pain       No current facility-administered medications on file prior to visit.    Assessment/Plan

## 2013-06-03 NOTE — Assessment & Plan Note (Addendum)
BP Readings from Last 3 Encounters:  06/03/13 159/86  05/15/13 174/97  05/13/13 151/70    Lab Results  Component Value Date   NA 136* 05/12/2013   K 4.0 05/12/2013   CREATININE 1.55* 05/12/2013    Assessment: Blood pressure control: mildly elevated Progress toward BP goal:  improved Comments: BP has improved since her last visit, but remains somewhat elevated.  Given proteinuria seen on last visit, I'd like to increase lisinopril, but we'll wait on metabolic panel results before doing so to ensure Cr isn't rising.  Plan: Medications:  Continue lisinopril Educational resources provided:   Self management tools provided:   Other plans: Recheck at next visit  Addendum 06/04/13: Cr stable at 1.5.  We will increase Lisinopril from 10 to 20 mg daily

## 2013-06-04 MED ORDER — LISINOPRIL 20 MG PO TABS: 20.0000 mg | ORAL_TABLET | Freq: Every day | ORAL | Status: DC

## 2013-06-04 NOTE — Addendum Note (Signed)
Addended by: Hester Mates on: 06/04/2013 04:02 PM   Modules accepted: Orders

## 2013-06-12 ENCOUNTER — Ambulatory Visit (INDEPENDENT_AMBULATORY_CARE_PROVIDER_SITE_OTHER): Payer: 59 | Admitting: Internal Medicine

## 2013-06-12 ENCOUNTER — Encounter: Payer: Self-pay | Admitting: Internal Medicine

## 2013-06-12 ENCOUNTER — Encounter: Payer: 59 | Admitting: Dietician

## 2013-06-12 VITALS — BP 144/85 | HR 88 | Temp 97.6°F | Ht 67.0 in | Wt 196.0 lb

## 2013-06-12 DIAGNOSIS — R600 Localized edema: Secondary | ICD-10-CM

## 2013-06-12 DIAGNOSIS — N183 Chronic kidney disease, stage 3 unspecified: Secondary | ICD-10-CM

## 2013-06-12 DIAGNOSIS — I129 Hypertensive chronic kidney disease with stage 1 through stage 4 chronic kidney disease, or unspecified chronic kidney disease: Secondary | ICD-10-CM

## 2013-06-12 DIAGNOSIS — M79609 Pain in unspecified limb: Secondary | ICD-10-CM

## 2013-06-12 DIAGNOSIS — R609 Edema, unspecified: Secondary | ICD-10-CM

## 2013-06-12 DIAGNOSIS — E1149 Type 2 diabetes mellitus with other diabetic neurological complication: Secondary | ICD-10-CM

## 2013-06-12 DIAGNOSIS — M79676 Pain in unspecified toe(s): Secondary | ICD-10-CM

## 2013-06-12 DIAGNOSIS — E785 Hyperlipidemia, unspecified: Secondary | ICD-10-CM

## 2013-06-12 DIAGNOSIS — I10 Essential (primary) hypertension: Secondary | ICD-10-CM

## 2013-06-12 LAB — GLUCOSE, CAPILLARY: GLUCOSE-CAPILLARY: 209 mg/dL — AB (ref 70–99)

## 2013-06-12 MED ORDER — GABAPENTIN 300 MG PO CAPS: 300.0000 mg | ORAL_CAPSULE | Freq: Three times a day (TID) | ORAL | Status: DC

## 2013-06-12 NOTE — Assessment & Plan Note (Addendum)
No significant pitting edema on my exam today. I counseled patient on warning signs of heart failure including pitting edema, orthopnea, dyspnea on exertion. She will return to the clinic if any of these symptoms develop for an echocardiogram. - Continue compression stockings when necessary

## 2013-06-12 NOTE — Progress Notes (Signed)
Subjective:    Patient ID: Jade Bond, female    DOB: [birth year], 48 y.o.   MRN: [id number]  HPI  [name] is a 48 y.o. woman with history of uncontrolled DM2 with neurologic manifestations, HTN, HLD, allergic rhinitis who presents for routine follow up.  DM2 - Last A1c >14 in 04/15. Cr 1.50 on 06/03/13, stable. Urine studies in 4/15 showed microalbuminuria, urine albumin of 278mg /dl. She is taking an ACE-I. Metformin was discontinued in January. In terms of insulin, she takes Lantus 25 units in the am and 30 units in the pm, and a sliding scale Novolog of 4-10 units. She has a history of non-compliance but reports she has been taking her insulin as prescribed. She is due for an eye exam, she sees Dr. Katy Fitch. She's complaining of some burning and tingling pain in her big toes and on the bottoms of her feet today.  HTN - 144/85 today. At home she takes Lisinopril 20mg  daily.   HLD - Compliant with Lipitor 40mg  daily.   LE edema - Seen by Dr. Owens Shark about this issue a few weeks ago. BMP unchanged. She has not yet obtained compression stockings.   Current Outpatient Prescriptions on File Prior to Visit  Medication Sig Dispense Refill  . acetaminophen (TYLENOL) 500 MG tablet Take 1,000 mg by mouth every 6 (six) hours as needed (headache).      Marland Kitchen aspirin 81 MG EC tablet Take 1 tablet (81 mg total) by mouth daily. Swallow whole.  30 tablet  12  . atorvastatin (LIPITOR) 40 MG tablet Take 1 tablet (40 mg total) by mouth daily.  30 tablet  6  . Blood Glucose Monitoring Suppl (ACCU-CHEK AVIVA) device Please use device to check blood sugars 4 times a day, before meals and at bedtime.  1 each  0  . glucose blood test strip Please use with device to check blood sugars 4 times a day, before meals and at bedtime.  100 each  12  . insulin aspart (NOVOLOG PENFILL) 100 UNIT/ML injection Inject 4-10 Units into the skin 3 (three) times daily before meals. Sliding scale as directed. Pt takes anywhere from  4-10 units when blood sugar is >150.      Marland Kitchen insulin glargine (LANTUS) 100 UNIT/ML injection Inject 25 Units into the skin at bedtime. 25 units in the am and 30 units pm      . Insulin Pen Needle 32G X 4 MM MISC Please use with Lantus pen to administer insulin in the morning and at night.  100 each  11  . Lancets (ACCU-CHEK SOFT TOUCH) lancets Please use with device to check blood sugars 4 times a day, before meals and at bedtime.  100 each  12  . lisinopril (PRINIVIL,ZESTRIL) 20 MG tablet Take 1 tablet (20 mg total) by mouth daily.  30 tablet  2  . nitroGLYCERIN (NITROSTAT) 0.4 MG SL tablet Place 0.4 mg under the tongue every 5 (five) minutes as needed. For chest pain         Review of Systems Constitutional: Negative for fever and chills.  Eyes: Negative for visual disturbance.  Respiratory: Negative for cough and shortness of breath.  Cardiovascular: Negative for chest pain.  Gastrointestinal: Negative for abdominal pain.  Genitourinary: Negative for dysuria.  Musculoskeletal: Negative for myalgias.  Skin: Negative for rash.  Neurological: Negative for light-headedness, numbness and headaches.      Objective:   Physical Exam  Constitutional: She is oriented to person, place, and  time. She appears well-developed and well-nourished.  HENT:  Head: Normocephalic and atraumatic.  Eyes: Conjunctivae and EOM are normal. Pupils are equal, round, and reactive to light.  Neck: Normal range of motion. Neck supple.  Cardiovascular: Normal rate and regular rhythm.  Exam reveals no gallop and no friction rub.   Murmur (2/6 systolic murmur heard best at LUSB, present on prior exams) heard. Pulmonary/Chest: Effort normal and breath sounds normal. No respiratory distress. She has no wheezes. She has no rales. She exhibits no tenderness.  Abdominal: She exhibits no distension. There is no tenderness.  Musculoskeletal: Normal range of motion. She exhibits no edema (No appreciable pitting edema in her  lower extremities) and no tenderness.  Neurological: She is alert and oriented to person, place, and time.  Sensation grossly intact on her feet, no skin breakdown  Skin: Skin is warm and dry.  No MTP joint redness, swelling  Psychiatric: She has a normal mood and affect.    Filed Vitals:   06/12/13 1356  BP: 144/85  Pulse: 88  Temp: 97.6 F (36.4 C)       Assessment & Plan:   Please see problem based charting.

## 2013-06-12 NOTE — Assessment & Plan Note (Signed)
Upon clarification, pain is underneath the toes on the bottoms of her feet. I think her symptoms favor neuropathic pain instead of gout. It is a numb tingling pain. There is no MTP joint swelling or erythema. I counseled the patient on warning signs for gout. She will return to the clinic if she develops any of these symptoms.

## 2013-06-12 NOTE — Assessment & Plan Note (Signed)
BP Readings from Last 3 Encounters:  06/12/13 144/85  06/03/13 159/86  05/15/13 174/97    Lab Results  Component Value Date   NA 139 06/03/2013   K 4.7 06/03/2013   CREATININE 1.50* 06/03/2013    Assessment: Blood pressure control: controlled Progress toward BP goal:  at goal  Plan: Medications:  continue current medications, Lisinopril 20mg  daily.

## 2013-06-12 NOTE — Progress Notes (Signed)
Attending physician note: Presenting problems, physical findings, and medications reviewed with resident physician  Dr. Lesly Dukes and I concur with her management plan. Murriel Hopper, M.D., [place]

## 2013-06-12 NOTE — Patient Instructions (Addendum)
Thank you for your visit. - I prescribed a medication called gabapentin for your nerve pain. I would like you to take 300 mg (1 tab), three times a day in divided doses. Please work up to this dose by starting at 300 mg (1 tab) once at night on day 1, 300 mg once with lunch and once again at night on day 2, and 300 mg three times per day on day 3. - This medication can make you sleepy, when you try it first please take it at night. - Continue your insulin as prescribed. - You're due for an eye exam. Please call Dr. Katy Fitch and arrange an appointment. - We will continue to work to get your records from [place]. - Continue using stockings for your leg swelling. - If you develop shortness of breath, worsening leg swelling, or trouble lying flat on your back due to shortness of breath please call the clinic. - Warning signs of gout are red, swollen, exquisitely painful toe pain. - Otherwise, please return to see me in one month.

## 2013-06-12 NOTE — Assessment & Plan Note (Addendum)
Lab Results  Component Value Date   HGBA1C >14.0 05/15/2013   HGBA1C >14.0 02/21/2013   HGBA1C >14.0 11/24/2011     Assessment: Diabetes control: poor control (HgbA1C >9%) Progress toward A1C goal:  unchanged Comments: Reports compliance with insulin regimen. Glucometer shows pretty good control, 64% of readings in target range and the rest above. No lows. Fasting sugars are perfect, she has some highs later in the day after meals. If she keeps this up, I expect to see improvement in her A1C next visit.  Plan: Medications:  continue current medications, she takes Lantus 25 units in the am and 30 units in the pm, and a sliding scale Novolog of 4-10 units. Home glucose monitoring: Frequency: 4 times a day Timing: before breakfast;before lunch;before dinner;at bedtime Instruction/counseling given: reminded to get eye exam and reminded to bring blood glucose meter & log to each visit Self management tools provided: copy of home glucose meter download Other plans: She is scheduled to meet with Butch Penny in June. Eye appointment has been made for June 1. Prescribing gabapentin 300 mg 3 times a day for presumed neuropathic pain.

## 2013-06-12 NOTE — Assessment & Plan Note (Addendum)
Creat  Date Value Ref Range Status  06/03/2013 1.50* 0.50 - 1.10 mg/dL Final  02/21/2013 1.44* 0.50 - 1.10 mg/dL Final  11/24/2011 1.34* 0.50 - 1.10 mg/dL Final     Creatinine, Ser  Date Value Ref Range Status  05/12/2013 1.55* 0.50 - 1.10 mg/dL Final  12/13/2011 1.20* 0.50 - 1.10 mg/dL Final     Stable.

## 2013-06-17 ENCOUNTER — Encounter: Payer: Self-pay | Admitting: Internal Medicine

## 2013-06-17 DIAGNOSIS — I209 Angina pectoris, unspecified: Secondary | ICD-10-CM | POA: Insufficient documentation

## 2013-06-27 ENCOUNTER — Other Ambulatory Visit: Payer: Self-pay | Admitting: *Deleted

## 2013-06-27 MED ORDER — INSULIN GLARGINE 100 UNIT/ML ~~LOC~~ SOLN: 25.0000 [IU] | Freq: Every day | SUBCUTANEOUS | Status: DC

## 2013-06-27 MED ORDER — INSULIN ASPART 100 UNIT/ML ~~LOC~~ SOLN: SUBCUTANEOUS | Status: DC

## 2013-07-01 ENCOUNTER — Telehealth: Payer: Self-pay | Admitting: *Deleted

## 2013-07-01 ENCOUNTER — Encounter (INDEPENDENT_AMBULATORY_CARE_PROVIDER_SITE_OTHER): Payer: 59 | Admitting: Ophthalmology

## 2013-07-01 DIAGNOSIS — H43819 Vitreous degeneration, unspecified eye: Secondary | ICD-10-CM

## 2013-07-01 DIAGNOSIS — I10 Essential (primary) hypertension: Secondary | ICD-10-CM

## 2013-07-01 DIAGNOSIS — E11319 Type 2 diabetes mellitus with unspecified diabetic retinopathy without macular edema: Secondary | ICD-10-CM

## 2013-07-01 DIAGNOSIS — E1139 Type 2 diabetes mellitus with other diabetic ophthalmic complication: Secondary | ICD-10-CM

## 2013-07-01 DIAGNOSIS — E1165 Type 2 diabetes mellitus with hyperglycemia: Secondary | ICD-10-CM

## 2013-07-01 DIAGNOSIS — H35039 Hypertensive retinopathy, unspecified eye: Secondary | ICD-10-CM

## 2013-07-01 DIAGNOSIS — H3581 Retinal edema: Secondary | ICD-10-CM

## 2013-07-01 NOTE — Telephone Encounter (Signed)
Fax from [facility] - needs clarification on Lantus insulin directions. Is it only at bedtime or AM and PM? Please send new rx. Thanks

## 2013-07-02 ENCOUNTER — Other Ambulatory Visit: Payer: Self-pay | Admitting: Internal Medicine

## 2013-07-02 MED ORDER — INSULIN GLARGINE 100 UNIT/ML ~~LOC~~ SOLN: SUBCUTANEOUS | Status: DC

## 2013-07-02 NOTE — Telephone Encounter (Signed)
I have reordered it at the correct dosing. Let me know if you need anything else.  Lesly Dukes, MD  [email or web address] Pager # [phone] After hours and weekends # [phone] Office # [phone]

## 2013-07-03 ENCOUNTER — Ambulatory Visit (INDEPENDENT_AMBULATORY_CARE_PROVIDER_SITE_OTHER): Payer: 59 | Admitting: Internal Medicine

## 2013-07-03 ENCOUNTER — Encounter: Payer: Self-pay | Admitting: Internal Medicine

## 2013-07-03 VITALS — BP 146/86 | HR 101 | Temp 99.3°F | Ht 67.0 in | Wt 196.3 lb

## 2013-07-03 DIAGNOSIS — R112 Nausea with vomiting, unspecified: Secondary | ICD-10-CM

## 2013-07-03 DIAGNOSIS — E1149 Type 2 diabetes mellitus with other diabetic neurological complication: Secondary | ICD-10-CM

## 2013-07-03 DIAGNOSIS — G909 Disorder of the autonomic nervous system, unspecified: Secondary | ICD-10-CM

## 2013-07-03 LAB — GLUCOSE, CAPILLARY: Glucose-Capillary: 487 mg/dL — ABNORMAL HIGH (ref 70–99)

## 2013-07-03 MED ORDER — PROMETHAZINE HCL 25 MG PO TABS: 25.0000 mg | ORAL_TABLET | Freq: Four times a day (QID) | ORAL | Status: DC | PRN

## 2013-07-03 MED ORDER — BENZONATATE 100 MG PO CAPS: 100.0000 mg | ORAL_CAPSULE | Freq: Four times a day (QID) | ORAL | Status: DC | PRN

## 2013-07-03 NOTE — Assessment & Plan Note (Signed)
Last visit she was prescribed gabapentin for burning and tingling pain in her big toes and on the bottoms of her feet. This has really helped and the pain in her feet has gone away. - Continue Gabapentin 300mg  3 times a day

## 2013-07-03 NOTE — Progress Notes (Signed)
Subjective:    Patient ID: Jade Bond, female    DOB: May 24, 1965, 48 y.o.   MRN: [id number]  HPI  [name] is a 48 y.o. woman with history of uncontrolled DM2 with neurologic manifestations, HTN, HLD, allergic rhinitis who presents for an acute visit.  Patient reports she "feels bad all over". When asked to clarify, she says she feels nauseous and had some retching x2 this morning. She also has a nonproductive cough x2 days. Her blood sugar here is >400. She ran out of insulin 2 days ago and was planning to pick up her refill this afternoon. She didn't bring her glucometer today.   Current Outpatient Prescriptions on File Prior to Visit  Medication Sig Dispense Refill  . acetaminophen (TYLENOL) 500 MG tablet Take 1,000 mg by mouth every 6 (six) hours as needed (headache).      Marland Kitchen aspirin 81 MG EC tablet Take 1 tablet (81 mg total) by mouth daily. Swallow whole.  30 tablet  12  . atorvastatin (LIPITOR) 40 MG tablet Take 1 tablet (40 mg total) by mouth daily.  30 tablet  6  . Blood Glucose Monitoring Suppl (ACCU-CHEK AVIVA) device Please use device to check blood sugars 4 times a day, before meals and at bedtime.  1 each  0  . gabapentin (NEURONTIN) 300 MG capsule Take 1 capsule (300 mg total) by mouth 3 (three) times daily.  90 capsule  2  . glucose blood test strip Please use with device to check blood sugars 4 times a day, before meals and at bedtime.  100 each  12  . insulin aspart (NOVOLOG) 100 UNIT/ML injection Sliding scale as directed. Pt takes anywhere from 4-10 units when blood sugar is >150. diag code 250.62. Insulin dependent  20 mL  6  . insulin glargine (LANTUS) 100 UNIT/ML injection Inject 25 units in the am and 30 units pm. diag code 250.62. Insulin dependent  20 mL  6  . Insulin Pen Needle 32G X 4 MM MISC Please use with Lantus pen to administer insulin in the morning and at night.  100 each  11  . Lancets (ACCU-CHEK SOFT TOUCH) lancets Please use with device to check  blood sugars 4 times a day, before meals and at bedtime.  100 each  12  . lisinopril (PRINIVIL,ZESTRIL) 20 MG tablet Take 1 tablet (20 mg total) by mouth daily.  30 tablet  2  . nitroGLYCERIN (NITROSTAT) 0.4 MG SL tablet Place 0.4 mg under the tongue every 5 (five) minutes as needed. For chest pain        Review of Systems  Constitutional: Positive for fatigue.  Respiratory: Positive for cough. Negative for shortness of breath.   Cardiovascular: Negative for chest pain.  Gastrointestinal: Positive for nausea and vomiting. Negative for abdominal pain.  Neurological: Negative for syncope.       Objective:   Physical Exam  Constitutional: She is oriented to person, place, and time. She appears well-developed and well-nourished. No distress.  Lying in bed taking a nap when I come in the room.  HENT:  Head: Normocephalic and atraumatic.  Eyes: Conjunctivae and EOM are normal. Pupils are equal, round, and reactive to light.  Neck: Normal range of motion. Neck supple.  Cardiovascular: Normal rate and regular rhythm.  Exam reveals no gallop and no friction rub.   No murmur heard. Pulmonary/Chest: Effort normal. No respiratory distress. She has no wheezes. She has no rales. She exhibits no tenderness.  Abdominal:  Soft. Bowel sounds are normal. She exhibits no distension. There is no tenderness.  Musculoskeletal: Normal range of motion. She exhibits no edema and no tenderness.  Neurological: She is alert and oriented to person, place, and time. No cranial nerve deficit.  Skin: Skin is warm and dry.  Psychiatric: She has a normal mood and affect.    Filed Vitals:   07/03/13 1534  BP: 146/86  Pulse: 101  Temp: 99.3 F (37.4 C)      Assessment & Plan:   Please see problem based charting.

## 2013-07-03 NOTE — Assessment & Plan Note (Addendum)
Symptoms are most likely secondary to hyperglycemia. All vital signs are stable. She is afebrile. The patient's blood sugar is greater than 400. She admits to running out of her insulin 2 days ago, and her symptoms started soon after. - Patient will pick up her insulin from the pharmacy as soon as the visit is over and use it as prescribed - Will treat her other issues symptomatically for now: Prescribed Phenergan 25 mg to be taken as needed (this is on the [facility] list), Tessalon pearls as needed for associated cough - If she does not feel better in 2-3 days, or develops a fever, worsening abdominal pain, nausea, vomiting, she will call the clinic or go to the emergency room - I will see her next week on 07/10/2013 for her routine primary care visit

## 2013-07-03 NOTE — Patient Instructions (Addendum)
Thank you for your visit. - Please pick up your insulin refills as soon as you leave the clinic. Your symptoms are likely due to high blood glucose. - I have sent prescriptions for Phenergan, a nausea medicine, and Tessalon, a cough medicine, to your [place]. Please take these as needed over the next few days. - If you do not feel better in 2-3 days, developed a fever, develop worsening abdominal pain, nausea, vomiting, please call the clinic or go to the emergency room. - I will see you next week for your primary care visit. Please bring your meter to this visit and your medicines.

## 2013-07-04 ENCOUNTER — Emergency Department (HOSPITAL_COMMUNITY): Payer: 59

## 2013-07-04 ENCOUNTER — Inpatient Hospital Stay (HOSPITAL_COMMUNITY)
Admission: EM | Admit: 2013-07-04 | Discharge: 2013-07-06 | DRG: 871 | Disposition: A | Discharge status: Home / Self Care | Payer: 59 | Attending: Internal Medicine | Admitting: Internal Medicine

## 2013-07-04 ENCOUNTER — Other Ambulatory Visit: Payer: Self-pay

## 2013-07-04 ENCOUNTER — Encounter (HOSPITAL_COMMUNITY): Payer: Self-pay | Admitting: Emergency Medicine

## 2013-07-04 DIAGNOSIS — E119 Type 2 diabetes mellitus without complications: Secondary | ICD-10-CM

## 2013-07-04 DIAGNOSIS — N179 Acute kidney failure, unspecified: Secondary | ICD-10-CM

## 2013-07-04 DIAGNOSIS — N185 Chronic kidney disease, stage 5: Secondary | ICD-10-CM | POA: Diagnosis present

## 2013-07-04 DIAGNOSIS — J189 Pneumonia, unspecified organism: Secondary | ICD-10-CM

## 2013-07-04 DIAGNOSIS — I10 Essential (primary) hypertension: Secondary | ICD-10-CM | POA: Diagnosis present

## 2013-07-04 DIAGNOSIS — E1165 Type 2 diabetes mellitus with hyperglycemia: Secondary | ICD-10-CM | POA: Diagnosis present

## 2013-07-04 DIAGNOSIS — N183 Chronic kidney disease, stage 3 unspecified: Secondary | ICD-10-CM

## 2013-07-04 DIAGNOSIS — E1149 Type 2 diabetes mellitus with other diabetic neurological complication: Secondary | ICD-10-CM | POA: Diagnosis present

## 2013-07-04 DIAGNOSIS — A419 Sepsis, unspecified organism: Principal | ICD-10-CM

## 2013-07-04 DIAGNOSIS — R112 Nausea with vomiting, unspecified: Secondary | ICD-10-CM | POA: Diagnosis present

## 2013-07-04 DIAGNOSIS — E785 Hyperlipidemia, unspecified: Secondary | ICD-10-CM

## 2013-07-04 DIAGNOSIS — E872 Acidosis, unspecified: Secondary | ICD-10-CM | POA: Diagnosis present

## 2013-07-04 DIAGNOSIS — N170 Acute kidney failure with tubular necrosis: Secondary | ICD-10-CM | POA: Diagnosis present

## 2013-07-04 DIAGNOSIS — IMO0002 Reserved for concepts with insufficient information to code with codable children: Secondary | ICD-10-CM | POA: Diagnosis present

## 2013-07-04 DIAGNOSIS — Z794 Long term (current) use of insulin: Secondary | ICD-10-CM

## 2013-07-04 DIAGNOSIS — I129 Hypertensive chronic kidney disease with stage 1 through stage 4 chronic kidney disease, or unspecified chronic kidney disease: Secondary | ICD-10-CM

## 2013-07-04 DIAGNOSIS — R011 Cardiac murmur, unspecified: Secondary | ICD-10-CM

## 2013-07-04 DIAGNOSIS — Z7982 Long term (current) use of aspirin: Secondary | ICD-10-CM

## 2013-07-04 HISTORY — DX: Pneumonia, unspecified organism: J18.9

## 2013-07-04 LAB — I-STAT TROPONIN, ED: TROPONIN I, POC: 0 ng/mL (ref 0.00–0.08)

## 2013-07-04 LAB — CBC
HCT: 31.3 % — ABNORMAL LOW (ref 36.0–46.0)
Hemoglobin: 11 g/dL — ABNORMAL LOW (ref 12.0–15.0)
MCH: 31.5 pg (ref 26.0–34.0)
MCHC: 35.1 g/dL (ref 30.0–36.0)
MCV: 89.7 fL (ref 78.0–100.0)
PLATELETS: 298 10*3/uL (ref 150–400)
RBC: 3.49 MIL/uL — AB (ref 3.87–5.11)
RDW: 13.3 % (ref 11.5–15.5)
WBC: 19.5 10*3/uL — AB (ref 4.0–10.5)

## 2013-07-04 LAB — GLUCOSE, CAPILLARY
GLUCOSE-CAPILLARY: 185 mg/dL — AB (ref 70–99)
Glucose-Capillary: 156 mg/dL — ABNORMAL HIGH (ref 70–99)
Glucose-Capillary: 159 mg/dL — ABNORMAL HIGH (ref 70–99)

## 2013-07-04 LAB — D-DIMER, QUANTITATIVE: D-Dimer, Quant: 1.47 ug/mL-FEU — ABNORMAL HIGH (ref 0.00–0.48)

## 2013-07-04 LAB — BASIC METABOLIC PANEL
BUN: 40 mg/dL — ABNORMAL HIGH (ref 6–23)
CHLORIDE: 94 meq/L — AB (ref 96–112)
CO2: 21 meq/L (ref 19–32)
Calcium: 8.2 mg/dL — ABNORMAL LOW (ref 8.4–10.5)
Creatinine, Ser: 2.48 mg/dL — ABNORMAL HIGH (ref 0.50–1.10)
GFR calc Af Amer: 26 mL/min — ABNORMAL LOW (ref >90)
GFR calc non Af Amer: 22 mL/min — ABNORMAL LOW (ref >90)
Glucose, Bld: 362 mg/dL — ABNORMAL HIGH (ref 70–99)
POTASSIUM: 4.5 meq/L (ref 3.7–5.3)
SODIUM: 130 meq/L — AB (ref 137–147)

## 2013-07-04 LAB — URINE MICROSCOPIC-ADD ON

## 2013-07-04 LAB — I-STAT CG4 LACTIC ACID, ED: Lactic Acid, Venous: 2.32 mmol/L — ABNORMAL HIGH (ref 0.5–2.2)

## 2013-07-04 LAB — URINALYSIS, ROUTINE W REFLEX MICROSCOPIC
Bilirubin Urine: NEGATIVE
Glucose, UA: 250 mg/dL — AB
Ketones, ur: NEGATIVE mg/dL
LEUKOCYTES UA: NEGATIVE
Nitrite: NEGATIVE
Specific Gravity, Urine: 1.019 (ref 1.005–1.030)
Urobilinogen, UA: 1 mg/dL (ref 0.0–1.0)
pH: 5.5 (ref 5.0–8.0)

## 2013-07-04 LAB — LIPASE, BLOOD: LIPASE: 17 U/L (ref 11–59)

## 2013-07-04 LAB — PROTIME-INR
INR: 1.21 (ref 0.00–1.49)
PROTHROMBIN TIME: 15 s (ref 11.6–15.2)

## 2013-07-04 LAB — APTT: APTT: 40 s — AB (ref 24–37)

## 2013-07-04 LAB — STREP PNEUMONIAE URINARY ANTIGEN: Strep Pneumo Urinary Antigen: NEGATIVE

## 2013-07-04 LAB — POC URINE PREG, ED: Preg Test, Ur: NEGATIVE

## 2013-07-04 MED ORDER — HYDROCODONE-ACETAMINOPHEN 5-325 MG PO TABS
1.0000 | ORAL_TABLET | Freq: Four times a day (QID) | ORAL | Status: DC | PRN
  Administered 2013-07-04 – 2013-07-05 (×2): 2 via ORAL
  Filled 2013-07-04 (×2): qty 2

## 2013-07-04 MED ORDER — ASPIRIN 81 MG PO CHEW
81.0000 mg | CHEWABLE_TABLET | Freq: Every day | ORAL | Status: DC
  Administered 2013-07-04 – 2013-07-06 (×3): 81 mg via ORAL
  Filled 2013-07-04 (×3): qty 1

## 2013-07-04 MED ORDER — DEXTROSE 5 % IV SOLN: 2.0000 g | Freq: Once | INTRAVENOUS | Status: DC

## 2013-07-04 MED ORDER — BENZONATATE 100 MG PO CAPS
100.0000 mg | ORAL_CAPSULE | Freq: Two times a day (BID) | ORAL | Status: DC | PRN
  Administered 2013-07-04: 100 mg via ORAL
  Filled 2013-07-04: qty 1

## 2013-07-04 MED ORDER — HEPARIN SODIUM (PORCINE) 5000 UNIT/ML IJ SOLN
5000.0000 [IU] | Freq: Three times a day (TID) | INTRAMUSCULAR | Status: DC
  Administered 2013-07-04 – 2013-07-06 (×6): 5000 [IU] via SUBCUTANEOUS
  Filled 2013-07-04 (×9): qty 1

## 2013-07-04 MED ORDER — ONDANSETRON HCL 4 MG/2ML IJ SOLN: 4.0000 mg | Freq: Three times a day (TID) | INTRAMUSCULAR | Status: DC | PRN

## 2013-07-04 MED ORDER — ATORVASTATIN CALCIUM 40 MG PO TABS
40.0000 mg | ORAL_TABLET | Freq: Every day | ORAL | Status: DC
  Administered 2013-07-05 – 2013-07-06 (×2): 40 mg via ORAL
  Filled 2013-07-04 (×2): qty 1

## 2013-07-04 MED ORDER — SODIUM CHLORIDE 0.9 % IV SOLN
INTRAVENOUS | Status: AC
  Administered 2013-07-04 (×2): via INTRAVENOUS

## 2013-07-04 MED ORDER — INSULIN GLARGINE 100 UNIT/ML ~~LOC~~ SOLN
20.0000 [IU] | Freq: Every day | SUBCUTANEOUS | Status: DC
  Administered 2013-07-04 – 2013-07-05 (×2): 20 [IU] via SUBCUTANEOUS
  Filled 2013-07-04 (×3): qty 0.2

## 2013-07-04 MED ORDER — GABAPENTIN 300 MG PO CAPS
300.0000 mg | ORAL_CAPSULE | Freq: Three times a day (TID) | ORAL | Status: DC
  Administered 2013-07-04 – 2013-07-06 (×6): 300 mg via ORAL
  Filled 2013-07-04 (×9): qty 1

## 2013-07-04 MED ORDER — INSULIN ASPART 100 UNIT/ML ~~LOC~~ SOLN
0.0000 [IU] | Freq: Three times a day (TID) | SUBCUTANEOUS | Status: DC
Start: 2013-07-04 — End: 2013-07-06
  Administered 2013-07-04 – 2013-07-05 (×2): 2 [IU] via SUBCUTANEOUS
  Administered 2013-07-05: 1 [IU] via SUBCUTANEOUS

## 2013-07-04 MED ORDER — DEXTROSE 5 % IV SOLN
500.0000 mg | INTRAVENOUS | Status: DC
  Administered 2013-07-04 – 2013-07-05 (×2): 500 mg via INTRAVENOUS
  Filled 2013-07-04 (×3): qty 500

## 2013-07-04 MED ORDER — DEXTROSE 5 % IV SOLN
1.0000 g | INTRAVENOUS | Status: DC
  Administered 2013-07-04 – 2013-07-05 (×2): 1 g via INTRAVENOUS
  Filled 2013-07-04 (×3): qty 10

## 2013-07-04 MED ORDER — SODIUM CHLORIDE 0.9 % IV BOLUS (SEPSIS)
1000.0000 mL | Freq: Once | INTRAVENOUS | Status: AC
  Administered 2013-07-04: 1000 mL via INTRAVENOUS

## 2013-07-04 MED ORDER — SODIUM CHLORIDE 0.9 % IV SOLN: INTRAVENOUS | Status: DC

## 2013-07-04 MED ORDER — ONDANSETRON HCL 4 MG/2ML IJ SOLN
4.0000 mg | Freq: Once | INTRAMUSCULAR | Status: AC
  Administered 2013-07-04: 4 mg via INTRAVENOUS
  Filled 2013-07-04: qty 2

## 2013-07-04 MED ORDER — DEXTROSE 5 % IV SOLN: 500.0000 mg | Freq: Once | INTRAVENOUS | Status: DC

## 2013-07-04 MED ORDER — MORPHINE SULFATE 4 MG/ML IJ SOLN
4.0000 mg | Freq: Once | INTRAMUSCULAR | Status: AC
  Administered 2013-07-04: 4 mg via INTRAVENOUS
  Filled 2013-07-04: qty 1

## 2013-07-04 MED ORDER — PROMETHAZINE HCL 25 MG PO TABS: 25.0000 mg | ORAL_TABLET | Freq: Four times a day (QID) | ORAL | Status: DC | PRN

## 2013-07-04 MED ORDER — SODIUM CHLORIDE 0.9 % IV SOLN: Freq: Once | INTRAVENOUS | Status: DC

## 2013-07-04 NOTE — ED Provider Notes (Signed)
CSN: [id number]     Arrival date & time 07/04/13  1007 History   First MD Initiated Contact with Patient 07/04/13 1039     Chief Complaint  Patient presents with  . Flank Pain  . Nausea  . Right chest pain      (Consider location/radiation/quality/duration/timing/severity/associated sxs/prior Treatment) HPI 48 year old female presents with 2 days of right sided chest pain. She states 3 days ago she had 2 episodes of vomiting after eating a sandwich and drink. Had some mild chills after but that has resolved. Went to her PCP who gave her some nausea meds. Then developed right sided chest and back pain, worse with inspiration. Has had mild cough that does cause her pain worsened. The cough started exact same time as the chest pain. Has not had any chills since the initial vomiting episode. No fevers noted. No more nausea or vomiting. She's not been eating or taking well the last couple days. She denies any focal or unilateral leg swelling but recently did have bilateral lower extremity edema that resolved with raising her legs. 5 days ago she came back from [place] in her car ride. She's never had a blood clot before. No history of recent surgery or hemoptysis.  Past Medical History  Diagnosis Date  . Hypertension   . Hyperlipidemia   . Angina   . Diabetes mellitus    Past Surgical History  Procedure Laterality Date  . Tonsillectomy  1980  . Cardiac catheterization  ~ 2008    negative, no intervention needed  . Tubal ligation  1995   Family History  Problem Relation Age of Onset  . Malignant hyperthermia Mother    History  Substance Use Topics  . Smoking status: Never Smoker   . Smokeless tobacco: Not on file  . Alcohol Use: No   OB History   Grav Para Term Preterm Abortions TAB SAB Ect Mult Living                 Review of Systems  Constitutional: Negative for fever.  Respiratory: Positive for cough and shortness of breath.   Cardiovascular: Positive for chest pain.  Negative for leg swelling.  Gastrointestinal: Positive for nausea and vomiting. Negative for abdominal pain.  All other systems reviewed and are negative.     Allergies  Review of patient's allergies indicates no known allergies.  Home Medications   Prior to Admission medications   Medication Sig Start Date End Date Taking? Authorizing Provider  acetaminophen (TYLENOL) 500 MG tablet Take 1,000 mg by mouth every 6 (six) hours as needed (headache).    Historical Provider, MD  aspirin 81 MG EC tablet Take 1 tablet (81 mg total) by mouth daily. Swallow whole. 05/15/13   Lesly Dukes, MD  atorvastatin (LIPITOR) 40 MG tablet Take 1 tablet (40 mg total) by mouth daily. 05/15/13   Lesly Dukes, MD  benzonatate (TESSALON PERLES) 100 MG capsule Take 1 capsule (100 mg total) by mouth every 6 (six) hours as needed for cough. 07/03/13 07/03/14  Lesly Dukes, MD  Blood Glucose Monitoring Suppl (ACCU-CHEK AVIVA) device Please use device to check blood sugars 4 times a day, before meals and at bedtime. 05/15/13 05/15/14  Lesly Dukes, MD  gabapentin (NEURONTIN) 300 MG capsule Take 1 capsule (300 mg total) by mouth 3 (three) times daily. 06/12/13 06/12/14  Lesly Dukes, MD  glucose blood test strip Please use with device to check blood sugars 4 times a day, before meals and at  bedtime. 05/15/13   Lesly Dukes, MD  insulin aspart (NOVOLOG) 100 UNIT/ML injection Sliding scale as directed. Pt takes anywhere from 4-10 units when blood sugar is >150. diag code 250.62. Insulin dependent 06/27/13   Lesly Dukes, MD  insulin glargine (LANTUS) 100 UNIT/ML injection Inject 25 units in the am and 30 units pm. diag code 250.62. Insulin dependent 07/02/13   Lesly Dukes, MD  Insulin Pen Needle 32G X 4 MM MISC Please use with Lantus pen to administer insulin in the morning and at night. 05/15/13   Lesly Dukes, MD  Lancets (ACCU-CHEK SOFT [facility]) lancets Please use with device to check blood sugars 4 times a day, before meals and at bedtime.  05/15/13   Lesly Dukes, MD  lisinopril (PRINIVIL,ZESTRIL) 20 MG tablet Take 1 tablet (20 mg total) by mouth daily. 06/04/13   Hester Mates, MD  nitroGLYCERIN (NITROSTAT) 0.4 MG SL tablet Place 0.4 mg under the tongue every 5 (five) minutes as needed. For chest pain    Historical Provider, MD  promethazine (PHENERGAN) 25 MG tablet Take 1 tablet (25 mg total) by mouth every 6 (six) hours as needed for nausea or vomiting. 07/03/13   Lesly Dukes, MD   BP 125/98  Pulse 98  Temp(Src) 98.4 F (36.9 C) (Oral)  Resp 20  Wt 196 lb (88.905 kg)  SpO2 100%  LMP 07/01/2013 Physical Exam  Nursing note and vitals reviewed. Constitutional: She is oriented to person, place, and time. She appears well-developed and well-nourished. No distress.  HENT:  Head: Normocephalic and atraumatic.  Right Ear: External ear normal.  Left Ear: External ear normal.  Nose: Nose normal.  Eyes: Right eye exhibits no discharge. Left eye exhibits no discharge.  Cardiovascular: Regular rhythm and normal heart sounds.  Tachycardia present.   HR just over 100  Pulmonary/Chest: Effort normal and breath sounds normal. She exhibits no tenderness.  Abdominal: Soft. She exhibits no distension. There is no tenderness.  Musculoskeletal: She exhibits no edema.  No back tenderness  Neurological: She is alert and oriented to person, place, and time.  Skin: Skin is warm and dry.    ED Course  Procedures (including critical care time) Labs Review Labs Reviewed  CBC - Abnormal; Notable for the following:    WBC 19.5 (*)    RBC 3.49 (*)    Hemoglobin 11.0 (*)    HCT 31.3 (*)    All other components within normal limits  BASIC METABOLIC PANEL - Abnormal; Notable for the following:    Sodium 130 (*)    Chloride 94 (*)    Glucose, Bld 362 (*)    BUN 40 (*)    Creatinine, Ser 2.48 (*)    Calcium 8.2 (*)    GFR calc non Af Amer 22 (*)    GFR calc Af Amer 26 (*)    All other components within normal limits  URINALYSIS, ROUTINE W  REFLEX MICROSCOPIC - Abnormal; Notable for the following:    APPearance CLOUDY (*)    Glucose, UA 250 (*)    Hgb urine dipstick LARGE (*)    Protein, ur >300 (*)    All other components within normal limits  D-DIMER, QUANTITATIVE - Abnormal; Notable for the following:    D-Dimer, Quant 1.47 (*)    All other components within normal limits  URINE MICROSCOPIC-ADD ON - Abnormal; Notable for the following:    Squamous Epithelial / LPF MANY (*)    Bacteria, UA FEW (*)  Casts GRANULAR CAST (*)    All other components within normal limits  I-STAT CG4 LACTIC ACID, ED - Abnormal; Notable for the following:    Lactic Acid, Venous 2.32 (*)    All other components within normal limits  CULTURE, BLOOD (ROUTINE X 2)  CULTURE, BLOOD (ROUTINE X 2)  LIPASE, BLOOD  I-STAT TROPOININ, ED  POC URINE PREG, ED    Imaging Review Dg Chest 2 View  07/04/2013   CLINICAL DATA:  Right chest pain  EXAM: CHEST  2 VIEW  COMPARISON:  Two the chest dated 03/18/2011  FINDINGS: Diffuse right upper lobe density. Low lung volumes. Lungs otherwise clear. Cardiac silhouette within normal limits. No acute osseous abnormalities.  IMPRESSION: Right upper lobe infiltrate. Surveillance evaluation status post appropriate therapeutic regimen to ensure response and/or resolution.   Electronically Signed   By: Margaree Mackintosh M.D.   On: 07/04/2013 11:13   Ct Chest Wo Contrast  07/04/2013   CLINICAL DATA:  Right-sided chest pain ; diabetes and chronic renal insufficiency  EXAM: CT CHEST WITHOUT CONTRAST  TECHNIQUE: Multidetector CT imaging of the chest was performed following the standard protocol without IV contrast.  COMPARISON:  Chest x-ray of today's date  FINDINGS: There is dense infiltrate in the mid and lower aspect of the right upper lobe with prominent air bronchograms. There is subsegmental atelectasis in the anterior aspect of the right middle lobe. The right lower lobe and the left lung are clear. The cardiac chambers are  top-normal in size. There is no pleural nor pericardial effusion. The caliber of the thoracic aorta is normal. There is a small hiatal hernia. As best as can be determined no mediastinal or hilar lymphadenopathy is present.  Within the upper abdomen the observed portions of the liver and spleen and adrenal glands are normal.  The sternum, thoracic spine, and the visualized ribs are normal P  IMPRESSION: 1. There is abnormal consolidation of the right upper lobe most compatible with pneumonia but an underlying mass is not excluded. Subsegmental atelectasis in the right middle lobe is present. 2. No definite mediastinal or hilar lymphadenopathy is demonstrated but the study is limited without IV contrast. 3. In discussion of the case with Dr. Regenia Skeeter, pulmonary embolism with pulmonary infarction is in the differential. The appearance of the right upper lobe abnormality is felt to be most compatible with pneumonia with an occult mass being the most likely secondary diagnosis.   Electronically Signed   By: David  Martinique   On: 07/04/2013 12:31     EKG Interpretation None      MDM   Final diagnoses:  CAP (community acquired pneumonia)    Patient is stable here, the pain improved with morphine. She was given fluids for possible sepsis as well as evidence of acute kidney injury. This is likely from her vomiting and poor by mouth intake. Patient's blood pressures normal and she has no hypoxia. She does have a mild cough, but with recent long car trip, pleuritic pain, and no fever there is concern for a possible pulmonary embolism. She has a mild to moderate elevation in d-dimer, however she is unable to get a CT angiogram due to her kidney disease. I discussed this with radiology recommends a CT chest without and if it is consistent with pneumonia she likely be treated for pneumonia. Her CT chest shows a most likely pneumonia with air bronchograms. At this point will treat for community-acquired pneumonia but  she may need a VQ scan to  rule out pulmonary embolism.    Ephraim Hamburger, MD 07/04/13 1311

## 2013-07-04 NOTE — Progress Notes (Signed)
NURSING PROGRESS NOTE  Raniah Karan [id number] Admission Data: 07/04/2013 3:15 PM Attending Provider: Aldine Contes, MD MBP:[id number], Sarah, MD Code Status: FULL  Jade Bond is a 48 y.o. female patient admitted from ED:  -No acute distress noted.  -No complaints of shortness of breath.  -No complaints of chest pain.   Cardiac Monitoring: Box # 16 in place. Cardiac monitor yields:sinus tachycardia.  Blood pressure 126/76, pulse 97, temperature 99.2 F (37.3 C), temperature source Oral, resp. rate 16, height 5\' 7"  (1.702 m), weight 88.905 kg (196 lb), last menstrual period 07/01/2013, SpO2 100.00%.   IV Fluids:  IV in place, occlusive dsg intact without redness, IV cath antecubital right, condition patent and no redness running normal saline.   Allergies:  Review of patient's allergies indicates no known allergies.  Past Medical History:   has a past medical history of Hypertension; Hyperlipidemia; Angina; Diabetes mellitus; and Pneumonia (07/04/2013).  Past Surgical History:   has past surgical history that includes Tonsillectomy (1980); Cardiac catheterization (~ 2008); and Tubal ligation (1995).  Social History:   reports that she has never smoked. She has never used smokeless tobacco. She reports that she does not drink alcohol or use illicit drugs.  Skin: intact  Patient/Family orientated to room. Information packet given to patient/family. Admission inpatient armband information verified with patient/family to include name and date of birth and placed on patient arm. Side rails up x 2, fall assessment and education completed with patient/family. Patient/family able to verbalize understanding of risk associated with falls and verbalized understanding to call for assistance before getting out of bed. Call light within reach. Patient/family able to voice and demonstrate understanding of unit orientation instructions.    Will continue to evaluate and treat per MD orders.  Wallie Renshaw, RN

## 2013-07-04 NOTE — H&P (Signed)
Patient seen and examined with Dr. Mechele Claude and Dr. Alice Rieger. In brief, patient is a 48 y/o female with PMH of HTN, CKD stage 3, DM,  HLD who presents with CP* 1 day. Patient states that yesterday she was seen at [facility] for nausea/vomiting * 1 day and "not feeling well" . She was found to have severe hyperglycemia from not taking her insulin(pt had run out) and symptoms were felt to be secondary to that. Of note, she also had cough *2 days. Today she developed CP - R sided, pleuritic in nature, 8/10, radiating to back. Pt did have chills yesterday and temp of 99 F. No SOB, no palpitations, no diaphoresis, no lightheadedness, no syncope. Of note patient recently drove 10 hours to [place]. Remaining ROS negative  Physical exam: Cardio- mildly tachycardic, normal heart sounds Lungs- CTA b/l Abd-  Soft, non tender, non distended, BS + Ext- no pedal edema Gen- AAO*3, NAD  Assessment and Plan:  Sepsis secondary to CAP - c/w rocephin, azithromycin for now - check blood cultures, urinary legionella Ag - Pt with mildly elevated LA. Recheck in AM - c/w pain control for pleuritic CP - CT chest with likely PNA. Would repeat CT in 4 weeks to rule out underlying mass once PNA resolves - Check HIV  AKI on CKD - likely prerenal v/s ATN - c/w IVF for now - If no improvement will consider renal sono and FeNa to further evaluate - hold lisinopril  DM - c/w lantus and monitor CBG with SSI  Case d/w patient and residents in detail

## 2013-07-04 NOTE — Progress Notes (Signed)
Utilization review completed. Jamea Robicheaux, RN, BSN. 

## 2013-07-04 NOTE — ED Notes (Signed)
Lactic acid results given to Dr. Regenia Skeeter

## 2013-07-04 NOTE — H&P (Signed)
Date: 07/04/2013               Patient Name:  Jade Bond MRN: [id number]  DOB: [birth year] Age / Sex: 48 y.o., female   PCP: Lesly Dukes, MD         Medical Service: Internal Medicine Teaching Service         Attending Physician: Dr. Aldine Contes, MD    First Contact: Dr. Mechele Claude Pager: [phone]  Second Contact: Dr. Alice Rieger Pager: [phone]       After Hours (After 5p/  First Contact Pager: [phone]  weekends / holidays): Second Contact Pager: [phone]   Chief Complaint: pleuritic chest pain, cough  History of Present Illness:  This is a 48yo AAF with PMH HTN, CKD 3, DM type 2 uncontrolled, HLD who presents with c/o pleuritic chest pain and dry cough x 2 days. Patient notes that she took a long car ride on Sunday back from [place] at which time she was in her usual state of health. Patient notes that on Tuesday early morning patient developed N/V x 1 episode after eating a sandwich, which has no resolved. However, later on Tuesday she developed chills, dry cough, and R sided pleuritic chest pain located under R breast and radiates around to R upper back. She took her temperature at home, which was 99 yesterday. Denies further N/V, abd pain, hemoptysis. She did have 1 episode of watery diarrhea at 7am this morning, otherwise no further episodes. She has some chronic intermittent leg swelling that is worse when she stands for prolonged periods, but this is not new. No leg pain. No recent surgery, hx of cancer.   In the ED, pt was tachycardic to low 100s, satting well on room air, afebrile and BP stable. She received 4mg  of morphine which did not help her pain. CXR showed a RUL infiltrate. ED also concerned about PE given recent travel hx so ordered D dimer which was mildly elevated at 1.47. Given her AKI, pt unable to receive contrast so CT chest w/o contrast was performed which showed likely PNA in RUL c/w CXR. Pt was not started on abx in ED.  Meds: Medications Prior to Admission    Medication Sig Dispense Refill  . acetaminophen (TYLENOL) 500 MG tablet Take 1,000 mg by mouth every 6 (six) hours as needed (headache).      Marland Kitchen aspirin 81 MG EC tablet Take 1 tablet (81 mg total) by mouth daily. Swallow whole.  30 tablet  12  . atorvastatin (LIPITOR) 40 MG tablet Take 1 tablet (40 mg total) by mouth daily.  30 tablet  6  . benzonatate (TESSALON PERLES) 100 MG capsule Take 1 capsule (100 mg total) by mouth every 6 (six) hours as needed for cough.  30 capsule  0  . Blood Glucose Monitoring Suppl (ACCU-CHEK AVIVA) device Please use device to check blood sugars 4 times a day, before meals and at bedtime.  1 each  0  . gabapentin (NEURONTIN) 300 MG capsule Take 1 capsule (300 mg total) by mouth 3 (three) times daily.  90 capsule  2  . glucose blood test strip Please use with device to check blood sugars 4 times a day, before meals and at bedtime.  100 each  12  . insulin aspart (NOVOLOG) 100 UNIT/ML injection Sliding scale as directed. Pt takes anywhere from 4-10 units when blood sugar is >150. diag code 250.62. Insulin dependent  20 mL  6  . insulin glargine (LANTUS) 100 UNIT/ML  injection Inject 25 units in the am and 30 units pm. diag code 250.62. Insulin dependent  20 mL  6  . Insulin Pen Needle 32G X 4 MM MISC Please use with Lantus pen to administer insulin in the morning and at night.  100 each  11  . Lancets (ACCU-CHEK SOFT TOUCH) lancets Please use with device to check blood sugars 4 times a day, before meals and at bedtime.  100 each  12  . lisinopril (PRINIVIL,ZESTRIL) 20 MG tablet Take 1 tablet (20 mg total) by mouth daily.  30 tablet  2  . nitroGLYCERIN (NITROSTAT) 0.4 MG SL tablet Place 0.4 mg under the tongue every 5 (five) minutes as needed. For chest pain      . promethazine (PHENERGAN) 25 MG tablet Take 1 tablet (25 mg total) by mouth every 6 (six) hours as needed for nausea or vomiting.  30 tablet  0   Allergies: Allergies as of 07/04/2013  . (No Known Allergies)    Past Medical History  Diagnosis Date  . Hypertension   . Hyperlipidemia   . Angina   . Diabetes mellitus    Past Surgical History  Procedure Laterality Date  . Tonsillectomy  1980  . Cardiac catheterization  ~ 2008    negative, no intervention needed  . Tubal ligation  1995   Family History  Problem Relation Age of Onset  . Malignant hyperthermia Mother    History   Social History  . Marital Status: Legally Separated    Spouse Name: N/A    Number of Children: N/A  . Years of Education: N/A   Occupational History  . Not on file.   Social History Main Topics  . Smoking status: Never Smoker   . Smokeless tobacco: Not on file  . Alcohol Use: No  . Drug Use: No  . Sexual Activity: Not on file   Other Topics Concern  . Not on file   Social History Narrative  . No narrative on file    Review of Systems: A comprehensive 10 point review of systems was performed and was negative other than what is stated in the HPI.  Physical Exam: Blood pressure 126/76, pulse 97, temperature 99.2 F (37.3 C), temperature source Oral, resp. rate 16, height 5\' 7"  (1.702 m), weight 196 lb (88.905 kg), last menstrual period 07/01/2013, SpO2 100.00%. General: alert, cooperative, and in no apparent distress; though appears uncomfortable with deep breathing HEENT: pupils equal round and reactive to light, vision grossly intact, oropharynx clear and non-erythematous, mucous membranes somewhat dry appearing Neck: supple Lungs: clear to ascultation bilaterally, normal work of respiration, no wheezes, rales, ronchi; no TTP to chest wall; pain experienced with deep breathing  Heart: regular rate and rhythm, no murmurs, gallops, or rubs Abdomen: soft, non-tender, non-distended, normal bowel sounds Extremities: no pedal edema, BLE are warm to touch Neurologic: alert & oriented X3, cranial nerves II-XII intact, strength grossly intact  Lab results: Basic Metabolic Panel:  Recent Labs   07/04/13 1027  NA 130*  K 4.5  CL 94*  CO2 21  GLUCOSE 362*  BUN 40*  CREATININE 2.48*  CALCIUM 8.2*    Recent Labs  07/04/13 1027  LIPASE 17   CBC:  Recent Labs  07/04/13 1027  WBC 19.5*  HGB 11.0*  HCT 31.3*  MCV 89.7  PLT 298   D-Dimer:  Recent Labs  07/04/13 1130  DDIMER 1.47*   CBG:  Recent Labs  07/03/13 1530 07/04/13 1428  GLUCAP  487* 156*   Urinalysis:  Recent Labs  07/04/13 1140  COLORURINE YELLOW  LABSPEC 1.019  PHURINE 5.5  GLUCOSEU 250*  HGBUR LARGE*  BILIRUBINUR NEGATIVE  KETONESUR NEGATIVE  PROTEINUR >300*  UROBILINOGEN 1.0  NITRITE NEGATIVE  LEUKOCYTESUR NEGATIVE    Imaging results:  Dg Chest 2 View  07/04/2013   CLINICAL DATA:  Right chest pain  EXAM: CHEST  2 VIEW  COMPARISON:  Two the chest dated 03/18/2011  FINDINGS: Diffuse right upper lobe density. Low lung volumes. Lungs otherwise clear. Cardiac silhouette within normal limits. No acute osseous abnormalities.  IMPRESSION: Right upper lobe infiltrate. Surveillance evaluation status post appropriate therapeutic regimen to ensure response and/or resolution.   Electronically Signed   By: Margaree Mackintosh M.D.   On: 07/04/2013 11:13   Ct Chest Wo Contrast  07/04/2013   CLINICAL DATA:  Right-sided chest pain ; diabetes and chronic renal insufficiency  EXAM: CT CHEST WITHOUT CONTRAST  TECHNIQUE: Multidetector CT imaging of the chest was performed following the standard protocol without IV contrast.  COMPARISON:  Chest x-ray of today's date  FINDINGS: There is dense infiltrate in the mid and lower aspect of the right upper lobe with prominent air bronchograms. There is subsegmental atelectasis in the anterior aspect of the right middle lobe. The right lower lobe and the left lung are clear. The cardiac chambers are top-normal in size. There is no pleural nor pericardial effusion. The caliber of the thoracic aorta is normal. There is a small hiatal hernia. As best as can be determined no  mediastinal or hilar lymphadenopathy is present.  Within the upper abdomen the observed portions of the liver and spleen and adrenal glands are normal.  The sternum, thoracic spine, and the visualized ribs are normal P  IMPRESSION: 1. There is abnormal consolidation of the right upper lobe most compatible with pneumonia but an underlying mass is not excluded. Subsegmental atelectasis in the right middle lobe is present. 2. No definite mediastinal or hilar lymphadenopathy is demonstrated but the study is limited without IV contrast. 3. In discussion of the case with Dr. Regenia Skeeter, pulmonary embolism with pulmonary infarction is in the differential. The appearance of the right upper lobe abnormality is felt to be most compatible with pneumonia with an occult mass being the most likely secondary diagnosis.   Electronically Signed   By: David  Martinique   On: 07/04/2013 12:31    Other results: EKG: Sinus tachycardia, ?q wave in V2; no ischemic change  Assessment & Plan by Problem:  # Sepsis 2/2 CAP: Patient with pleuritic chest pain, dry cough, tachycardia, leukocytosis (WBC 19) and CXR/CT scan findings showing likely PNA in RUL. Lactic acid elevated at 2.32. POC troponin in ED negative. PE is possible especially with consideration of her recent road trip, though less likely given CXR/CT scan findings that make PNA more likely. She is not hypoxic, has no asymmetric leg swelling or leg tenderness. D dimer was positive, but she cannot receive a CTA chest given her AKI. VQ scan is not likely to be of benefit given current known lung pathology. Will treat as CAP for now and reassess after she receives abx. -admit to tele -azithromycin 500mg  IV q24h, rocephin 1g IV q24h (Day 1/7) -repeat lactic acid level in AM -pain management w/ norco -zofran 4mg  q8h prn for nausea -S pneumo/legionella urinary Ag -HIV ab -CBC, CMP in AM  # AKI on CKD: Patient with Cr 2.48 w/ b/l Cr around 1.3-1.5. BUN/Cr ratio 16, so not  overtly prerenal in origin though patient did look dry on exam. S/p 1L NS in ED. She does take motrin/aleve at home for aches/pains though has not been taking much recently so I doubt this in contributing. Acute illness may also be playing a role.  -IVF- NS 125cc/hr x 12hr -recheck BMP in AM -pt instructed to avoid all NSAIDs  # DM type 2: Pt's last A1c >14% 05/2013. Patient is on lantus 25U qam and 30U qpm as well as aspart SSI (4-10U) at home.  -SSI, sensitive  -Lantus 20U qHS -monitor CBGs four times daily -continue home gabapentin  # HTN: Patient's BP is stable. Though given septic picture and risk for septic shock, will hold home antihypertensives. She takes lisinopril 20mg  daily at home. -hold home lisinopril   -continue home ASA 81mg  daily  # HLD: Stable. Continue home statin.  # VTE: heparin  # Diet: carb mod  Code status: full  Dispo: Disposition is deferred at this time, awaiting improvement of current medical problems. Anticipated discharge in approximately 1-2 day(s).   The patient does have a current PCP (Lesly Dukes, MD) and does need an [facility] hospital follow-up appointment after discharge.  The patient does not have transportation limitations that hinder transportation to clinic appointments.  Signed: Rebecca Eaton, MD 07/04/2013, 3:12 PM

## 2013-07-04 NOTE — Progress Notes (Signed)
Patient complaints of multiple coughing spills- she has asked for something for cough- MD notified. Awaiting any new orders.

## 2013-07-04 NOTE — ED Notes (Signed)
Pt in c/o pain under right breast that radiates around to her back, started after vomiting all night Tuesday, continues to c/o nausea and pain to that area, pain is worse when taking a deep breath

## 2013-07-04 NOTE — Progress Notes (Signed)
Report received from ED nurse Mendel Ryder for patient to be admitted into 5W27.

## 2013-07-04 NOTE — Progress Notes (Signed)
Attending physician note: Presenting complaints, physical findings, medications, reviewed with resident physician Dr. Lesly Dukes and I concur with the management plan. Murriel Hopper, MD, [place]  Hematology-Oncology/Internal Medicine

## 2013-07-04 NOTE — Consult Note (Signed)
PHARMACY NOTE  CONSULT :  Renal Adjustment of Antibiotics, Azithromycin, Ceftriaxone INDICATION :  CAP  ASSESSMENT:  Pharmacy consulted for Renal adjustment of Azithromycin, Ceftriaxone for CAP.     Current orders for Azithomycin 500 mg q 24 hours and Ceftriaxone 1 gm IV q 24 hours.  Dosing Weight  89 kg,  SCr 2.5, estimated CrCl  32 ml/min  Currently ordered doses are appropriate.  Neither Azithromycin nor Ceftriaxone require adjustments for renal function.   Last Liver function test showed no indication for severe hepatic disease.  PLAN:  1. Continue Azithromycin and Ceftriaxone as previously ordered. 2. Recommend Monitoring of renal and hepatic functions, Scr, UOP, WBC's, fever curve, any cultures/sensitivities, and clinical progression. 3. Pharmacy will sign off and follow peripherally given no adjustments in doses or schedules are expected.   4. Pharmacy has alerts in place to alert for dramatic changes in renal function or clinical condition that might require dose or schedule adjustments. 5. Please re-consult if additional assistance is needed.  Thank you for allowing Pharmacy to participate in this patient's care   Estelle June,  Pharm.D. ,  07/04/2013,  2:46 PM

## 2013-07-04 NOTE — ED Notes (Signed)
Patient transported to X-ray 

## 2013-07-05 ENCOUNTER — Other Ambulatory Visit (INDEPENDENT_AMBULATORY_CARE_PROVIDER_SITE_OTHER): Payer: 59 | Admitting: Ophthalmology

## 2013-07-05 LAB — COMPREHENSIVE METABOLIC PANEL
ALK PHOS: 89 U/L (ref 39–117)
ALT: 7 U/L (ref 0–35)
AST: 10 U/L (ref 0–37)
Albumin: 1.6 g/dL — ABNORMAL LOW (ref 3.5–5.2)
BUN: 34 mg/dL — AB (ref 6–23)
CO2: 21 mEq/L (ref 19–32)
Calcium: 7.3 mg/dL — ABNORMAL LOW (ref 8.4–10.5)
Chloride: 106 mEq/L (ref 96–112)
Creatinine, Ser: 2.17 mg/dL — ABNORMAL HIGH (ref 0.50–1.10)
GFR calc Af Amer: 30 mL/min — ABNORMAL LOW (ref >90)
GFR calc non Af Amer: 26 mL/min — ABNORMAL LOW (ref >90)
Glucose, Bld: 120 mg/dL — ABNORMAL HIGH (ref 70–99)
POTASSIUM: 3.8 meq/L (ref 3.7–5.3)
Sodium: 137 mEq/L (ref 137–147)
Total Protein: 5.7 g/dL — ABNORMAL LOW (ref 6.0–8.3)

## 2013-07-05 LAB — CBC
HEMATOCRIT: 28.6 % — AB (ref 36.0–46.0)
Hemoglobin: 9.7 g/dL — ABNORMAL LOW (ref 12.0–15.0)
MCH: 30.7 pg (ref 26.0–34.0)
MCHC: 33.9 g/dL (ref 30.0–36.0)
MCV: 90.5 fL (ref 78.0–100.0)
PLATELETS: 284 10*3/uL (ref 150–400)
RBC: 3.16 MIL/uL — ABNORMAL LOW (ref 3.87–5.11)
RDW: 13.4 % (ref 11.5–15.5)
WBC: 13.8 10*3/uL — AB (ref 4.0–10.5)

## 2013-07-05 LAB — LEGIONELLA ANTIGEN, URINE: LEGIONELLA ANTIGEN, URINE: NEGATIVE

## 2013-07-05 LAB — GLUCOSE, CAPILLARY
Glucose-Capillary: 116 mg/dL — ABNORMAL HIGH (ref 70–99)
Glucose-Capillary: 155 mg/dL — ABNORMAL HIGH (ref 70–99)
Glucose-Capillary: 145 mg/dL — ABNORMAL HIGH (ref 70–99)
Glucose-Capillary: 224 mg/dL — ABNORMAL HIGH (ref 70–99)

## 2013-07-05 LAB — LACTIC ACID, PLASMA: Lactic Acid, Venous: 0.6 mmol/L (ref 0.5–2.2)

## 2013-07-05 LAB — HIV ANTIBODY (ROUTINE TESTING W REFLEX): HIV 1&2 Ab, 4th Generation: NONREACTIVE

## 2013-07-05 MED ORDER — BENZONATATE 100 MG PO CAPS
100.0000 mg | ORAL_CAPSULE | Freq: Three times a day (TID) | ORAL | Status: DC | PRN
  Administered 2013-07-05 – 2013-07-06 (×2): 100 mg via ORAL
  Filled 2013-07-05 (×2): qty 1

## 2013-07-05 NOTE — Progress Notes (Addendum)
Subjective: Patient feeling improved today. Still with dry cough, but patient notes the pleuritic chest pain is improved. No other complaints. No nausea, chills.   Objective: Vital signs in last 24 hours: Filed Vitals:   07/04/13 1357 07/04/13 2021 07/04/13 2147 07/05/13 0341  BP: 126/76 123/74  122/72  Pulse: 97 107 98 98  Temp: 99.2 F (37.3 C) 101.9 F (38.8 C) 99.4 F (37.4 C) 98.7 F (37.1 C)  TempSrc: Oral Oral Oral Oral  Resp: 16 16  16   Height: 5\' 7"  (1.702 m)     Weight: 196 lb (88.905 kg)     SpO2: 100% 96%  94%   Weight change:   Intake/Output Summary (Last 24 hours) at 07/05/13 0705 Last data filed at 07/04/13 1838  Gross per 24 hour  Intake 581.25 ml  Output      0 ml  Net 581.25 ml   Physical Exam General: alert, cooperative, looks comfortable in bed today HEENT: pupils equal round and reactive to light, vision grossly intact, oropharynx clear and non-erythematous Neck: supple Lungs: clear to ascultation bilaterally, normal work of respiration, no wheezes, rales, ronchi; pain to R chest experienced with deep breathing, though less severe compared to yesterday Heart: RRR Abdomen: soft, non-tender, non-distended, normal bowel sounds  Extremities: no pedal edema, BLE are warm to touch Neurologic: alert & oriented X3, cranial nerves II-XII intact, strength grossly intact  Lab Results: Basic Metabolic Panel:  Recent Labs Lab 07/04/13 1027  NA 130*  K 4.5  CL 94*  CO2 21  GLUCOSE 362*  BUN 40*  CREATININE 2.48*  CALCIUM 8.2*   Recent Labs Lab 07/04/13 1027  LIPASE 17   CBC:  Recent Labs Lab 07/04/13 1027 07/05/13 0643  WBC 19.5* 13.8*  HGB 11.0* 9.7*  HCT 31.3* 28.6*  MCV 89.7 90.5  PLT 298 284   D-Dimer:  Recent Labs Lab 07/04/13 1130  DDIMER 1.47*   CBG:  Recent Labs Lab 07/03/13 1530 07/04/13 1428 07/04/13 1649 07/04/13 2116  GLUCAP 487* 156* 159* 185*   Coagulation:  Recent Labs Lab 07/04/13 1130  LABPROT  15.0  INR 1.21   Urinalysis:  Recent Labs Lab 07/04/13 1140  COLORURINE YELLOW  LABSPEC 1.019  PHURINE 5.5  GLUCOSEU 250*  HGBUR LARGE*  BILIRUBINUR NEGATIVE  KETONESUR NEGATIVE  PROTEINUR >300*  UROBILINOGEN 1.0  NITRITE NEGATIVE  LEUKOCYTESUR NEGATIVE   Studies/Results: Dg Chest 2 View  07/04/2013   CLINICAL DATA:  Right chest pain  EXAM: CHEST  2 VIEW  COMPARISON:  Two the chest dated 03/18/2011  FINDINGS: Diffuse right upper lobe density. Low lung volumes. Lungs otherwise clear. Cardiac silhouette within normal limits. No acute osseous abnormalities.  IMPRESSION: Right upper lobe infiltrate. Surveillance evaluation status post appropriate therapeutic regimen to ensure response and/or resolution.   Electronically Signed   By: Margaree Mackintosh M.D.   On: 07/04/2013 11:13   Ct Chest Wo Contrast  07/04/2013   CLINICAL DATA:  Right-sided chest pain ; diabetes and chronic renal insufficiency  EXAM: CT CHEST WITHOUT CONTRAST  TECHNIQUE: Multidetector CT imaging of the chest was performed following the standard protocol without IV contrast.  COMPARISON:  Chest x-ray of today's date  FINDINGS: There is dense infiltrate in the mid and lower aspect of the right upper lobe with prominent air bronchograms. There is subsegmental atelectasis in the anterior aspect of the right middle lobe. The right lower lobe and the left lung are clear. The cardiac chambers are top-normal in size.  There is no pleural nor pericardial effusion. The caliber of the thoracic aorta is normal. There is a small hiatal hernia. As best as can be determined no mediastinal or hilar lymphadenopathy is present.  Within the upper abdomen the observed portions of the liver and spleen and adrenal glands are normal.  The sternum, thoracic spine, and the visualized ribs are normal P  IMPRESSION: 1. There is abnormal consolidation of the right upper lobe most compatible with pneumonia but an underlying mass is not excluded. Subsegmental  atelectasis in the right middle lobe is present. 2. No definite mediastinal or hilar lymphadenopathy is demonstrated but the study is limited without IV contrast. 3. In discussion of the case with Dr. Regenia Skeeter, pulmonary embolism with pulmonary infarction is in the differential. The appearance of the right upper lobe abnormality is felt to be most compatible with pneumonia with an occult mass being the most likely secondary diagnosis.   Electronically Signed   By: David  Martinique   On: 07/04/2013 12:31   Medications: I have reviewed the patient's current medications. Scheduled Meds: . aspirin  81 mg Oral Daily  . atorvastatin  40 mg Oral Daily  . azithromycin  500 mg Intravenous Q24H  . cefTRIAXone (ROCEPHIN)  IV  1 g Intravenous Q24H  . gabapentin  300 mg Oral TID  . heparin  5,000 Units Subcutaneous 3 times per day  . insulin aspart  0-9 Units Subcutaneous TID WC  . insulin glargine  20 Units Subcutaneous QHS   Continuous Infusions:  PRN Meds:.benzonatate, HYDROcodone-acetaminophen, ondansetron (ZOFRAN) IV, promethazine  Assessment/Plan: # Sepsis 2/2 CAP: Improving. Patient spiked fever of 101.9 last night at 8pm and was afebrile since. Rest of VS stable, she is not hypoxic. Repeat lactic acid normalized (2.3-->.6). HIV nonreactive. S pneumo Ag negative, legionella Ag pending. Plan to continue IV abx for now as she is clinically improving. Expect discharge over the weekend. -azithromycin 500mg  IV q24h, rocephin 1g IV q24h (Day 1/7)  -pain management w/ norco  -zofran 4mg  q8h prn for nausea  -f/u legionella urinary Ag   -tessalon for cough -BCx NGTD -Of note, CT scan result mentioned that they could not rule out underlying mass in RUL--pt will need repeat CT scan of her chest in 4-6 weeks to reassess this once the PNA resolves -Pt has [facility] f/u appointment scheduled for next Wednesday   # AKI on CKD: Improving. Patient with Cr 2.48 on admission--> 2.17 this morning (b/l Cr around 1.3-1.5).  Given response after IVF, likely prerenal etiology. Pt received NS at 125cc/hr overnight. No need for further IVF given pt tolerating PO.  -d/c IVF -recheck BMP in AM  # DM type 2: Pt's last A1c >14% 05/2013. Patient is on lantus 25U qam and 30U qpm as well as aspart SSI (4-10U) at home. CBGs fairly well controlled overnight, 116-185. -SSI, sensitive  -Lantus 20U qHS  -monitor CBGs four times daily  -continue home gabapentin   # HTN: Patient's BP is normotensive. She takes lisinopril 20mg  daily at home.  -hold home lisinopril given current infection -continue home ASA 81mg  daily   # HLD: Stable. Continue home statin.   # VTE: heparin  # Diet: carb mod  Code status: full   Dispo: Disposition is deferred at this time, awaiting improvement of current medical problems. Suspect discharge over the weekend.   The patient does have a current PCP (Lesly Dukes, MD) and does need an [facility] hospital follow-up appointment after discharge.  The patient does not  have transportation limitations that hinder transportation to clinic appointments.  .Services Needed at time of discharge: Y = Yes, Blank = No PT:   OT:   RN:   Equipment:   Other:     LOS: 1 day   Rebecca Eaton, MD 07/05/2013, 7:05 AM

## 2013-07-05 NOTE — Progress Notes (Signed)
Visit to patient while in hospital. Will call after patient is discharged to assist with transition of care. 

## 2013-07-05 NOTE — Progress Notes (Signed)
Patient seen and examined. States she still has persistent cough but CP is mildly improved. Pt also complained of fevers last night but currently afebrile  Physical exam:  Cardio- RRR, normal heart sounds  Lungs- CTA b/l  Abd- Soft, non tender, non distended, BS +  Ext- no pedal edema  Gen- AAO*3, NAD   Assessment and Plan:   Sepsis secondary to CAP  - c/w rocephin, azithromycin day 2  -  urinary legionella Ag, strep pneumo Ag negative - blood cx with no growth till date. Will follow up - Lactic acidosis resolved - c/w pain control for pleuritic CP  - CT chest with likely PNA. Would repeat CT in 4 weeks to rule out underlying mass once PNA resolves  - Check HIV   AKI on CKD  - likely prerenal   - c/w IVF for now. Cr improving - daily BMP - hold lisinopril   DM  - c/w lantus and monitor CBG with SSI   Consider d/c over weekend if continues to improve Case d/w patient and residents in detail

## 2013-07-06 LAB — CBC WITH DIFFERENTIAL/PLATELET
BASOS ABS: 0 10*3/uL (ref 0.0–0.1)
BASOS PCT: 0 % (ref 0–1)
Eosinophils Absolute: 0.2 10*3/uL (ref 0.0–0.7)
Eosinophils Relative: 2 % (ref 0–5)
HEMATOCRIT: 28.8 % — AB (ref 36.0–46.0)
Hemoglobin: 9.6 g/dL — ABNORMAL LOW (ref 12.0–15.0)
Lymphocytes Relative: 22 % (ref 12–46)
Lymphs Abs: 1.9 10*3/uL (ref 0.7–4.0)
MCH: 30.2 pg (ref 26.0–34.0)
MCHC: 33.3 g/dL (ref 30.0–36.0)
MCV: 90.6 fL (ref 78.0–100.0)
MONO ABS: 0.6 10*3/uL (ref 0.1–1.0)
Monocytes Relative: 7 % (ref 3–12)
NEUTROS PCT: 69 % (ref 43–77)
Neutro Abs: 6 10*3/uL (ref 1.7–7.7)
Platelets: 355 10*3/uL (ref 150–400)
RBC: 3.18 MIL/uL — ABNORMAL LOW (ref 3.87–5.11)
RDW: 13.5 % (ref 11.5–15.5)
WBC: 8.7 10*3/uL (ref 4.0–10.5)

## 2013-07-06 LAB — BASIC METABOLIC PANEL
BUN: 27 mg/dL — AB (ref 6–23)
CALCIUM: 8 mg/dL — AB (ref 8.4–10.5)
CO2: 22 mEq/L (ref 19–32)
CREATININE: 1.79 mg/dL — AB (ref 0.50–1.10)
Chloride: 108 mEq/L (ref 96–112)
GFR calc Af Amer: 38 mL/min — ABNORMAL LOW (ref >90)
GFR calc non Af Amer: 33 mL/min — ABNORMAL LOW (ref >90)
Glucose, Bld: 96 mg/dL (ref 70–99)
Potassium: 4.1 mEq/L (ref 3.7–5.3)
Sodium: 141 mEq/L (ref 137–147)

## 2013-07-06 LAB — GLUCOSE, CAPILLARY
GLUCOSE-CAPILLARY: 102 mg/dL — AB (ref 70–99)
Glucose-Capillary: 108 mg/dL — ABNORMAL HIGH (ref 70–99)

## 2013-07-06 MED ORDER — HYDROCODONE-ACETAMINOPHEN 5-325 MG PO TABS: 1.0000 | ORAL_TABLET | Freq: Four times a day (QID) | ORAL | Status: DC | PRN

## 2013-07-06 MED ORDER — AMOXICILLIN 500 MG PO CAPS: 1000.0000 mg | ORAL_CAPSULE | Freq: Three times a day (TID) | ORAL | Status: AC

## 2013-07-06 MED ORDER — AZITHROMYCIN 500 MG PO TABS
500.0000 mg | ORAL_TABLET | Freq: Every day | ORAL | Status: AC
Start: 2013-07-07 — End: 2013-07-10

## 2013-07-06 MED ORDER — INSULIN GLARGINE 100 UNIT/ML ~~LOC~~ SOLN
20.0000 [IU] | Freq: Every day | SUBCUTANEOUS | Status: DC
Start: 2013-07-06 — End: 2014-02-07

## 2013-07-06 NOTE — Discharge Summary (Signed)
[facility] Discharge Note  Name: Jade Bond MRN: [id number] DOB: 03-Nov-1965 48 y.o.  Date of Admission: 07/04/2013 10:09 AM Date of Discharge: 07/06/2013 Attending Physician: Aldine Contes, MD  Discharge Diagnosis: Principal Problem:   CAP (community acquired pneumonia) Active Problems:   HTN (hypertension)   CKD (chronic kidney disease) stage 3, GFR 30-59 ml/min   Type II or unspecified type diabetes mellitus with neurological manifestations, uncontrolled   Nausea with vomiting   Discharge Medications:   Medication List    STOP taking these medications       acetaminophen 500 MG tablet  Commonly known as:  TYLENOL     lisinopril 20 MG tablet  Commonly known as:  PRINIVIL,ZESTRIL     promethazine 25 MG tablet  Commonly known as:  PHENERGAN      TAKE these medications       ACCU-CHEK AVIVA device  Please use device to check blood sugars 4 times a day, before meals and at bedtime.     accu-chek soft touch lancets  Please use with device to check blood sugars 4 times a day, before meals and at bedtime.     amoxicillin 500 MG capsule  Commonly known as:  AMOXIL  Take 2 capsules (1,000 mg total) by mouth 3 (three) times daily.  Start taking on:  07/07/2013     aspirin 81 MG EC tablet  Take 1 tablet (81 mg total) by mouth daily. Swallow whole.     atorvastatin 40 MG tablet  Commonly known as:  LIPITOR  Take 1 tablet (40 mg total) by mouth daily.     azithromycin 500 MG tablet  Commonly known as:  ZITHROMAX  Take 1 tablet (500 mg total) by mouth daily.  Start taking on:  07/07/2013     benzonatate 100 MG capsule  Commonly known as:  TESSALON PERLES  Take 1 capsule (100 mg total) by mouth every 6 (six) hours as needed for cough.     gabapentin 300 MG capsule  Commonly known as:  NEURONTIN  Take 1 capsule (300 mg total) by mouth 3 (three) times daily.     glucose blood test strip  Please use with device to check blood sugars 4  times a day, before meals and at bedtime.     HYDROcodone-acetaminophen 5-325 MG per tablet  Commonly known as:  NORCO/VICODIN  Take 1-2 tablets by mouth every 6 (six) hours as needed for moderate pain.     insulin aspart 100 UNIT/ML injection  Commonly known as:  novoLOG  Sliding scale as directed. Pt takes anywhere from 4-10 units when blood sugar is >150. diag code 250.62. Insulin dependent     insulin glargine 100 UNIT/ML injection  Commonly known as:  LANTUS  Inject 0.2 mLs (20 Units total) into the skin at bedtime.     Insulin Pen Needle 32G X 4 MM Misc  Please use with Lantus pen to administer insulin in the morning and at night.     nitroGLYCERIN 0.4 MG SL tablet  Commonly known as:  NITROSTAT  Place 0.4 mg under the tongue every 5 (five) minutes as needed. For chest pain        Disposition and follow-up:   Jade Bond was discharged from [facility] in Stable condition.  At the hospital follow up visit please address  1. Evaluate patient for symptoms of pneumonia. 2. Reassess completion of antibiotics. End Date 07/10/2013. 3. Patient will require follow up imaging with  chest x-ray, versus chest CT scan after 6 weeks (around mid July 2015) to rule out any possibility of pulmonary mass versus pulmonary infarction. 4. Check BMP for renal function. She had noted AKI 5. If her renal function is better, her ACE inhibitor can be restarted. 6. Make changes to insulin therapy as needed. She was discharged on 20 units of Lantus each bedtime, but she takes 25-30 units prior to admission.  Follow-up Appointments: Follow-up Information   Follow up with Delia In 2 days.   Contact information:   [address] [place] [address] [phone]     Discharge Instructions   Call MD for:  extreme fatigue    Complete by:  As directed      Call MD for:  persistant nausea and vomiting    Complete by:  As directed      Call MD for:   temperature >100.4    Complete by:  As directed      Diet - low sodium heart healthy    Complete by:  As directed      Increase activity slowly    Complete by:  As directed            Consultations:  none  Procedures Performed:  Dg Chest 2 View  07/04/2013   CLINICAL DATA:  Right chest pain  EXAM: CHEST  2 VIEW  COMPARISON:  Two the chest dated 03/18/2011  FINDINGS: Diffuse right upper lobe density. Low lung volumes. Lungs otherwise clear. Cardiac silhouette within normal limits. No acute osseous abnormalities.  IMPRESSION: Right upper lobe infiltrate. Surveillance evaluation status post appropriate therapeutic regimen to ensure response and/or resolution.   Electronically Signed   By: Margaree Mackintosh M.D.   On: 07/04/2013 11:13   Ct Chest Wo Contrast  07/04/2013   CLINICAL DATA:  Right-sided chest pain ; diabetes and chronic renal insufficiency  EXAM: CT CHEST WITHOUT CONTRAST  TECHNIQUE: Multidetector CT imaging of the chest was performed following the standard protocol without IV contrast.  COMPARISON:  Chest x-ray of today's date  FINDINGS: There is dense infiltrate in the mid and lower aspect of the right upper lobe with prominent air bronchograms. There is subsegmental atelectasis in the anterior aspect of the right middle lobe. The right lower lobe and the left lung are clear. The cardiac chambers are top-normal in size. There is no pleural nor pericardial effusion. The caliber of the thoracic aorta is normal. There is a small hiatal hernia. As best as can be determined no mediastinal or hilar lymphadenopathy is present.  Within the upper abdomen the observed portions of the liver and spleen and adrenal glands are normal.  The sternum, thoracic spine, and the visualized ribs are normal P  IMPRESSION: 1. There is abnormal consolidation of the right upper lobe most compatible with pneumonia but an underlying mass is not excluded. Subsegmental atelectasis in the right middle lobe is present. 2. No  definite mediastinal or hilar lymphadenopathy is demonstrated but the study is limited without IV contrast. 3. In discussion of the case with Dr. Regenia Skeeter, pulmonary embolism with pulmonary infarction is in the differential. The appearance of the right upper lobe abnormality is felt to be most compatible with pneumonia with an occult mass being the most likely secondary diagnosis.   Electronically Signed   By: David  Martinique   On: 07/04/2013 12:31    Admission HPI:   Chief Complaint: pleuritic chest pain, cough   History of Present  Illness:  This is a 48yo AAF with PMH HTN, CKD 3, DM type 2 uncontrolled, HLD who presents with c/o pleuritic chest pain and dry cough x 2 days. Patient notes that she took a long car ride on Sunday back from [place] at which time she was in her usual state of health. Patient notes that on Tuesday early morning patient developed N/V x 1 episode after eating a sandwich, which has no resolved. However, later on Tuesday she developed chills, dry cough, and R sided pleuritic chest pain located under R breast and radiates around to R upper back. She took her temperature at home, which was 99 yesterday. Denies further N/V, abd pain, hemoptysis. She did have 1 episode of watery diarrhea at 7am this morning, otherwise no further episodes. She has some chronic intermittent leg swelling that is worse when she stands for prolonged periods, but this is not new. No leg pain. No recent surgery, hx of cancer.  In the ED, pt was tachycardic to low 100s, satting well on room air, afebrile and BP stable. She received 4mg  of morphine which did not help her pain. CXR showed a RUL infiltrate. ED also concerned about PE given recent travel hx so ordered D dimer which was mildly elevated at 1.47. Given her AKI, pt unable to receive contrast so CT chest w/o contrast was performed which showed likely PNA in RUL c/w CXR. Pt was not started on abx in ED   Hospital Course by problem list: Principal  Problem:   CAP (community acquired pneumonia) Active Problems:   HTN (hypertension)   CKD (chronic kidney disease) stage 3, GFR 30-59 ml/min   Type II or unspecified type diabetes mellitus with neurological manifestations, uncontrolled   Nausea with vomiting  Sepsis secondary to CAP: patient presented with symptoms of pneumonia with clinical signs of sepsis. She was treated with IV Rocephin and and and IV azithromycin. She demonstrated improvement after 2 days of IV antibiotics, and she was discharged on amoxicillin 1 g 3 times a day and azithromycin 500 mg daily to complete 7 days of treatment. End date of antibiotic therapy is 07/10/2013. Given her chest imaging, including a CT scan without contrast raising a concern of a pulmonary mass versus pulmonary infarction in right upper lung, she will definitely need follow chest imaging after 6 weeks (around mid July 2015). D-dimer was slightly elevated to 1.47 however, our clinical suspicion for pulmonary embolism was low. We could not perform a chest CTA due to a decline in her renal function. Overall we felt that her imaging findings were in keeping with pneumonia, which improved with antibiotics. She was discharged in stable condition with outpatient follow up visit within a week. She was discharged with 30 pills of hydrocodone-acetaminophen for the pleuritic chest pain. No refills will be required  AKI on CKD: on presentation Cr 2.48 which improved to 1.79 at discharge. It appears her baseline creatinine is 1.3-1.5. This acute renal injury was felt to be secondary to her acute illness with a pneumonia. She'll require BMP as outpatient within a week to evaluate renal function, and electrolytes.  DM type 2: last A1c >14% 05/2013. Her CBGs remained well controlled on SSI and Lantus 20U qHS. She was discharged on the same regimen with the plan to follow up as outpatient with her glucose meter to make necessary changes. She continued with her home gabapentin    HTN: She remained normotensive during hospitalization. Her home, lisinopril 20 daily was held in the  setting of acute renal injury. During outpatient follow up visit, this can be restarted after a BMP.  Discharge Vitals:  BP 140/90  Pulse 79  Temp(Src) 98.9 F (37.2 C) (Oral)  Resp 18  Ht 5\' 7"  (1.702 m)  Wt 196 lb (88.905 kg)  BMI 30.69 kg/m2  SpO2 95%  LMP 07/01/2013  Discharge Labs:  Results for orders placed during the hospital encounter of 07/04/13 (from the past 24 hour(s))  GLUCOSE, CAPILLARY     Status: Abnormal   Collection Time    07/05/13  5:30 PM      Result Value Ref Range   Glucose-Capillary 145 (*) 70 - 99 mg/dL  GLUCOSE, CAPILLARY     Status: Abnormal   Collection Time    07/05/13  9:33 PM      Result Value Ref Range   Glucose-Capillary 224 (*) 70 - 99 mg/dL  BASIC METABOLIC PANEL     Status: Abnormal   Collection Time    07/06/13  5:55 AM      Result Value Ref Range   Sodium 141  137 - 147 mEq/L   Potassium 4.1  3.7 - 5.3 mEq/L   Chloride 108  96 - 112 mEq/L   CO2 22  19 - 32 mEq/L   Glucose, Bld 96  70 - 99 mg/dL   BUN 27 (*) 6 - 23 mg/dL   Creatinine, Ser 1.79 (*) 0.50 - 1.10 mg/dL   Calcium 8.0 (*) 8.4 - 10.5 mg/dL   GFR calc non Af Amer 33 (*) >90 mL/min   GFR calc Af Amer 38 (*) >90 mL/min  CBC WITH DIFFERENTIAL     Status: Abnormal   Collection Time    07/06/13  5:55 AM      Result Value Ref Range   WBC 8.7  4.0 - 10.5 K/uL   RBC 3.18 (*) 3.87 - 5.11 MIL/uL   Hemoglobin 9.6 (*) 12.0 - 15.0 g/dL   HCT 28.8 (*) 36.0 - 46.0 %   MCV 90.6  78.0 - 100.0 fL   MCH 30.2  26.0 - 34.0 pg   MCHC 33.3  30.0 - 36.0 g/dL   RDW 13.5  11.5 - 15.5 %   Platelets 355  150 - 400 K/uL   Neutrophils Relative % 69  43 - 77 %   Neutro Abs 6.0  1.7 - 7.7 K/uL   Lymphocytes Relative 22  12 - 46 %   Lymphs Abs 1.9  0.7 - 4.0 K/uL   Monocytes Relative 7  3 - 12 %   Monocytes Absolute 0.6  0.1 - 1.0 K/uL   Eosinophils Relative 2  0 - 5 %   Eosinophils Absolute  0.2  0.0 - 0.7 K/uL   Basophils Relative 0  0 - 1 %   Basophils Absolute 0.0  0.0 - 0.1 K/uL  GLUCOSE, CAPILLARY     Status: Abnormal   Collection Time    07/06/13  8:00 AM      Result Value Ref Range   Glucose-Capillary 102 (*) 70 - 99 mg/dL  GLUCOSE, CAPILLARY     Status: Abnormal   Collection Time    07/06/13 12:46 PM      Result Value Ref Range   Glucose-Capillary 108 (*) 70 - 99 mg/dL    Signed: Jessee Avers 07/06/2013, 1:33 PM   Time Spent on Discharge: 40 Services Ordered on Discharge: none Equipment Ordered on Discharge: none

## 2013-07-06 NOTE — Progress Notes (Addendum)
07/06/13 Patient ambulated in hallway and maintained  95% sats. No complaints of SOB .Wiil be discharged home when ready to be picked up. IV site removed,Discharge instructions reviewed with patient.

## 2013-07-06 NOTE — Discharge Instructions (Signed)
Thank you for allowing [place] to be involved in your healthcare while you were hospitalized at [facility].   Please note that there have been changes to your home medications.  --> PLEASE LOOK AT YOUR DISCHARGE MEDICATION LIST FOR DETAILS.  Be sure to take your antibiotics as prescribed for your pneumonia  Be sure to bring all your medication with your glucose meter in order to assist your doctor make changes to your insulin dose as needed.   I hope you feel better soon   Please call your PCP if you have any questions or concerns, or any difficulty getting any of your medications.  Please return to the ER if you have worsening of your symptoms or new severe symptoms arise. Pneumonia, Adult Pneumonia is an infection of the lungs.  CAUSES Pneumonia may be caused by bacteria or a virus. Usually, these infections are caused by breathing infectious particles into the lungs (respiratory tract). SYMPTOMS  Cough. Fever. Chest pain. Increased rate of breathing. Wheezing. Mucus production. DIAGNOSIS  If you have the common symptoms of pneumonia, your caregiver will typically confirm the diagnosis with a chest X-ray. The X-ray will show an abnormality in the lung (pulmonary infiltrate) if you have pneumonia. Other tests of your blood, urine, or sputum may be done to find the specific cause of your pneumonia. Your caregiver may also do tests (blood gases or pulse oximetry) to see how well your lungs are working. TREATMENT  Some forms of pneumonia may be spread to other people when you cough or sneeze. You may be asked to wear a mask before and during your exam. Pneumonia that is caused by bacteria is treated with antibiotic medicine. Pneumonia that is caused by the influenza virus may be treated with an antiviral medicine. Most other viral infections must run their course. These infections will not respond to antibiotics.  PREVENTION A pneumococcal shot (vaccine) is available to  prevent a common bacterial cause of pneumonia. This is usually suggested for: People over 68 years old. Patients on chemotherapy. People with chronic lung problems, such as bronchitis or emphysema. People with immune system problems. If you are over 65 or have a high risk condition, you may receive the pneumococcal vaccine if you have not received it before. In some countries, a routine influenza vaccine is also recommended. This vaccine can help prevent some cases of pneumonia.You may be offered the influenza vaccine as part of your care. If you smoke, it is time to quit. You may receive instructions on how to stop smoking. Your caregiver can provide medicines and counseling to help you quit. HOME CARE INSTRUCTIONS  Cough suppressants may be used if you are losing too much rest. However, coughing protects you by clearing your lungs. You should avoid using cough suppressants if you can. Your caregiver may have prescribed medicine if he or she thinks your pneumonia is caused by a bacteria or influenza. Finish your medicine even if you start to feel better. Your caregiver may also prescribe an expectorant. This loosens the mucus to be coughed up. Only take over-the-counter or prescription medicines for pain, discomfort, or fever as directed by your caregiver. Do not smoke. Smoking is a common cause of bronchitis and can contribute to pneumonia. If you are a smoker and continue to smoke, your cough may last several weeks after your pneumonia has cleared. A cold steam vaporizer or humidifier in your room or home may help loosen mucus. Coughing is often worse at night. Sleeping  in a semi-upright position in a recliner or using a couple pillows under your head will help with this. Get rest as you feel it is needed. Your body will usually let you know when you need to rest. SEEK IMMEDIATE MEDICAL CARE IF:  Your illness becomes worse. This is especially true if you are elderly or weakened from any other  disease. You cannot control your cough with suppressants and are losing sleep. You begin coughing up blood. You develop pain which is getting worse or is uncontrolled with medicines. You have a fever. Any of the symptoms which initially brought you in for treatment are getting worse rather than better. You develop shortness of breath or chest pain. MAKE SURE YOU:  Understand these instructions. Will watch your condition. Will get help right away if you are not doing well or get worse. Document Released: 01/17/2005 Document Revised: 04/11/2011 Document Reviewed: 04/08/2010 [facility] Patient Information 2014 [place], [place].

## 2013-07-06 NOTE — Progress Notes (Signed)
Subjective: Feels weak today but otherwise slept well last night. No fevers. Went to bathroom without assistance and had a shower.   Objective: Vital signs in last 24 hours: Filed Vitals:   07/05/13 1322 07/05/13 2042 07/06/13 0453 07/06/13 0504  BP: 151/82 145/88 176/97 140/90  Pulse: 93 96 79   Temp: 98.5 F (36.9 C) 98.7 F (37.1 C) 98.9 F (37.2 C)   TempSrc: Oral Oral Oral   Resp: 18 16 18    Height:      Weight:      SpO2: 92% 95% 97%    Weight change:   Intake/Output Summary (Last 24 hours) at 07/06/13 1055 Last data filed at 07/05/13 1615  Gross per 24 hour  Intake    300 ml  Output      0 ml  Net    300 ml   Physical Exam General: alert, cooperative, having breakfast. Not in distress Lungs: clear to ascultation bilaterally, normal work of respiration, no wheezes, rales, ronchi; pain to R chest experienced with deep breathing, though less severe compared to yesterday Heart: RRR Abdomen: soft, non-tender, non-distended, normal bowel sounds  Extremities: no pedal edema, BLE are warm to touch Neurologic: alert & oriented X3  Lab Results: Basic Metabolic Panel:  Recent Labs Lab 07/05/13 0643 07/06/13 0555  NA 137 141  K 3.8 4.1  CL 106 108  CO2 21 22  GLUCOSE 120* 96  BUN 34* 27*  CREATININE 2.17* 1.79*  CALCIUM 7.3* 8.0*    Recent Labs Lab 07/04/13 1027  LIPASE 17   CBC:  Recent Labs Lab 07/05/13 0643 07/06/13 0555  WBC 13.8* 8.7  NEUTROABS  --  6.0  HGB 9.7* 9.6*  HCT 28.6* 28.8*  MCV 90.5 90.6  PLT 284 355   D-Dimer:  Recent Labs Lab 07/04/13 1130  DDIMER 1.47*   CBG:  Recent Labs Lab 07/04/13 2116 07/05/13 0752 07/05/13 1224 07/05/13 1730 07/05/13 2133 07/06/13 0800  GLUCAP 185* 116* 155* 145* 224* 102*   Coagulation:  Recent Labs Lab 07/04/13 1130  LABPROT 15.0  INR 1.21   Urinalysis:  Recent Labs Lab 07/04/13 1140  COLORURINE YELLOW  LABSPEC 1.019  PHURINE 5.5  GLUCOSEU 250*  HGBUR LARGE*   BILIRUBINUR NEGATIVE  KETONESUR NEGATIVE  PROTEINUR >300*  UROBILINOGEN 1.0  NITRITE NEGATIVE  LEUKOCYTESUR NEGATIVE   Studies/Results: Dg Chest 2 View  07/04/2013   CLINICAL DATA:  Right chest pain  EXAM: CHEST  2 VIEW  COMPARISON:  Two the chest dated 03/18/2011  FINDINGS: Diffuse right upper lobe density. Low lung volumes. Lungs otherwise clear. Cardiac silhouette within normal limits. No acute osseous abnormalities.  IMPRESSION: Right upper lobe infiltrate. Surveillance evaluation status post appropriate therapeutic regimen to ensure response and/or resolution.   Electronically Signed   By: Margaree Mackintosh M.D.   On: 07/04/2013 11:13   Ct Chest Wo Contrast  07/04/2013   CLINICAL DATA:  Right-sided chest pain ; diabetes and chronic renal insufficiency  EXAM: CT CHEST WITHOUT CONTRAST  TECHNIQUE: Multidetector CT imaging of the chest was performed following the standard protocol without IV contrast.  COMPARISON:  Chest x-ray of today's date  FINDINGS: There is dense infiltrate in the mid and lower aspect of the right upper lobe with prominent air bronchograms. There is subsegmental atelectasis in the anterior aspect of the right middle lobe. The right lower lobe and the left lung are clear. The cardiac chambers are top-normal in size. There is no pleural nor pericardial effusion.  The caliber of the thoracic aorta is normal. There is a small hiatal hernia. As best as can be determined no mediastinal or hilar lymphadenopathy is present.  Within the upper abdomen the observed portions of the liver and spleen and adrenal glands are normal.  The sternum, thoracic spine, and the visualized ribs are normal P  IMPRESSION: 1. There is abnormal consolidation of the right upper lobe most compatible with pneumonia but an underlying mass is not excluded. Subsegmental atelectasis in the right middle lobe is present. 2. No definite mediastinal or hilar lymphadenopathy is demonstrated but the study is limited without  IV contrast. 3. In discussion of the case with Dr. Regenia Skeeter, pulmonary embolism with pulmonary infarction is in the differential. The appearance of the right upper lobe abnormality is felt to be most compatible with pneumonia with an occult mass being the most likely secondary diagnosis.   Electronically Signed   By: David  Martinique   On: 07/04/2013 12:31   Medications: I have reviewed the patient's current medications. Scheduled Meds: . aspirin  81 mg Oral Daily  . atorvastatin  40 mg Oral Daily  . azithromycin  500 mg Intravenous Q24H  . cefTRIAXone (ROCEPHIN)  IV  1 g Intravenous Q24H  . gabapentin  300 mg Oral TID  . heparin  5,000 Units Subcutaneous 3 times per day  . insulin aspart  0-9 Units Subcutaneous TID WC  . insulin glargine  20 Units Subcutaneous QHS   Continuous Infusions:  PRN Meds:.benzonatate, HYDROcodone-acetaminophen, ondansetron (ZOFRAN) IV, promethazine  Assessment/Plan: # Sepsis 2/2 CAP: Improving. Patient spiked fever of 101.9 last night at 8pm and was afebrile since. HIV nonreactive. S pneumo Ag negative, legionella Ag pending.  Plan  - will discharge her today  -azithromycin 500mg  IV q24h, rocephin 1g IV q24h (Day 2/7)  -pain management w/ norco  -zofran 4mg  q8h prn for nausea  -tessalon for cough -BCx NGTD -Of note, CT scan result mentioned that they could not rule out underlying mass in RUL--pt will need repeat CT scan of her chest in 4-6 weeks to reassess this once the PNA resolves -Pt has [facility] f/u appointment scheduled for next Wednesday   # AKI on CKD: Improving. Patient with Cr 2.48 >>1.79 (b/l Cr around 1.3-1.5). Will need to recheck as outpatient.-d/c IVF -recheck BMP in AM  # DM type 2: Pt's last A1c >14% 05/2013. On SSI and Lantus 20U qHS  -monitor CBGs four times daily  -continue home gabapentin   # HTN: Patient's BP is normotensive. She takes lisinopril 20mg  daily at home.  -hold home lisinopril given current infection, can be restarted outpatient  is needed. -continue home ASA 81mg  daily   # HLD: Stable. Continue home statin.   # VTE: heparin  # Diet: carb mod  Code status: full   Dispo: Disposition is deferred at this time, awaiting improvement of current medical problems. Discharge today.   The patient does have a current PCP (Lesly Dukes, MD) and does need an [facility] hospital follow-up appointment after discharge.  The patient does not have transportation limitations that hinder transportation to clinic appointments.  .Services Needed at time of discharge: Y = Yes, Blank = No PT:   OT:   RN:   Equipment:   Other:     LOS: 2 days   Jessee Avers, MD 07/06/2013, 10:55 AM

## 2013-07-08 ENCOUNTER — Telehealth: Payer: Self-pay | Admitting: Dietician

## 2013-07-08 NOTE — Telephone Encounter (Signed)
Calling to assist with transition of care from hospital to home.  Discharge date:07-06-13 Call date: 07-08-13 Hospital follow up appointment date: 07-10-13 a 52 PM with Dr. Lucila Maine  Discharge medications reviewed:yes Able to fill all prescriptions? Was able to fill all except gabapentin, insulin was filled in vials instead of pens, prefers pens, will make due for now with vials, but in the future please alert to fill insulin as pens.   Patient aware of hospital follow up appointments. yes No problems with transportation: Shortness of breath while walking around the house, taking breaks, showering, caring for grandson, thinks it is because  Other problems/concerns: no  07-01-13 eye exam Dr. Zigmund Daniel, has bleeding in her eyes, was supposed to have surgery but was admitted to the hospital,  waiting to reschedule

## 2013-07-08 NOTE — Telephone Encounter (Signed)
Gabapentin was refilled 06/12/13 with 2 refills.

## 2013-07-08 NOTE — Discharge Summary (Signed)
I have reviewed Dr. Arsenio Katz note and agree with findings and plan as documented in his note

## 2013-07-10 ENCOUNTER — Ambulatory Visit (INDEPENDENT_AMBULATORY_CARE_PROVIDER_SITE_OTHER): Payer: 59 | Admitting: Internal Medicine

## 2013-07-10 VITALS — BP 161/90 | HR 68 | Temp 97.4°F | Ht 67.0 in | Wt 192.5 lb

## 2013-07-10 DIAGNOSIS — I10 Essential (primary) hypertension: Secondary | ICD-10-CM

## 2013-07-10 DIAGNOSIS — N183 Chronic kidney disease, stage 3 unspecified: Secondary | ICD-10-CM

## 2013-07-10 DIAGNOSIS — J189 Pneumonia, unspecified organism: Secondary | ICD-10-CM

## 2013-07-10 DIAGNOSIS — E1149 Type 2 diabetes mellitus with other diabetic neurological complication: Secondary | ICD-10-CM

## 2013-07-10 DIAGNOSIS — I129 Hypertensive chronic kidney disease with stage 1 through stage 4 chronic kidney disease, or unspecified chronic kidney disease: Secondary | ICD-10-CM

## 2013-07-10 LAB — CULTURE, BLOOD (ROUTINE X 2)
CULTURE: NO GROWTH
Culture: NO GROWTH

## 2013-07-10 LAB — GLUCOSE, CAPILLARY: Glucose-Capillary: 132 mg/dL — ABNORMAL HIGH (ref 70–99)

## 2013-07-10 NOTE — Assessment & Plan Note (Addendum)
Symptoms are improving and she has been compliant with her antibiotics. All vital signs are stable here and she denies fevers at home. - Complete antibiotics, continue Tessalon for symptomatic relief - Work note provided for this past Monday and Tuesday (she returned to work today) - Given her chest imaging (CT scan without contrast most compatible with pneumonia but raised a concern of a pulmonary mass versus pulmonary infarction 2/2 PE in right upper lung), she will need follow chest imaging after 5-6 weeks (around mid July 2015)

## 2013-07-10 NOTE — Assessment & Plan Note (Addendum)
BP Readings from Last 3 Encounters:  07/10/13 161/90  07/06/13 166/91  07/03/13 146/86    Lab Results  Component Value Date   NA 141 07/06/2013   K 4.1 07/06/2013   CREATININE 1.79* 07/06/2013    Assessment: Blood pressure control: mildly elevated Progress toward BP goal:  deteriorated Comments: Lisinopril was discontinued upon hospital discharge due to AKI.  Plan: Medications:  continue current medications - checking BMP, if her creatinine is back to baseline I will restart lisinopril 20 mg daily Educational resources provided: brochure  ADDENDUM: Cr is back to baseline at 1.6. She may re-start her ACE-I. Attempted to call patient, but phone number is out of service. I will have the front desk send her a letter instructing her to restart her lisinopril 20 mg daily and to provide an updated phone number. I have added the medication back to her medication list.

## 2013-07-10 NOTE — Assessment & Plan Note (Addendum)
Patient has known proteinuria and CKD, likely secondary to nephrosclerosis from uncontrolled diabetes and hypertension. Checking BMP today for resolution of acute kidney injury.  ADDENDUM: Cr back to baseline.  BMET    Component Value Date/Time   NA 135 07/10/2013 1549   K 4.2 07/10/2013 1549   CL 102 07/10/2013 1549   CO2 22 07/10/2013 1549   GLUCOSE 105* 07/10/2013 1549   BUN 19 07/10/2013 1549   CREATININE 1.60* 07/10/2013 1549   CREATININE 1.79* 07/06/2013 0555   CALCIUM 8.5 07/10/2013 1549   GFRNONAA 38* 07/10/2013 1549   GFRNONAA 33* 07/06/2013 0555   GFRAA 44* 07/10/2013 1549   GFRAA 38* 07/06/2013 5809

## 2013-07-10 NOTE — Assessment & Plan Note (Signed)
Lab Results  Component Value Date   HGBA1C >14.0 05/15/2013   HGBA1C >14.0 02/21/2013   HGBA1C >14.0 11/24/2011     Assessment: Diabetes control: poor control (HgbA1C >9%) Progress toward A1C goal:  improved Comments: CBG here is 132. Glucometer shows improved control over the past month. Average 180. No lows. 55% within range. Highs tend to be in the middle of the day after lunch, she uses a sliding scale NovoLog to treat this.  Plan: Medications:  continue current medications - Lantus 20 units at bedtime, sliding scale NovoLog of 4-10 units  Home glucose monitoring: Frequency:   4 times daily Timing:   before meals and at bedtime Instruction/counseling given: reminded to bring blood glucose meter & log to each visit and reminded to bring medications to each visit Educational resources provided: brochure Self management tools provided: copy of home glucose meter download Other plans: Followup in 5-6 weeks. Restart ACE inhibitor if BMP shows resolution of AKI.

## 2013-07-10 NOTE — Patient Instructions (Signed)
Thank you for your visit. - Please finish your antibiotics and continue your current insulin regimen. - We will do some blood work today and if your kidney function has returned to normal, I will call you about restarting your blood pressure medicine (tomorrow or Friday). - Please return to the clinic in about 5 weeks, in mid July, for repeat chest imaging and to check in about your diabetes. - If you develop recurrent shortness of breath, chest pain, fast heart rate please return to the ED.

## 2013-07-10 NOTE — Progress Notes (Signed)
Subjective:    Patient ID: Jade Bond, female    DOB: 06/20/1965, 48 y.o.   MRN: [id number]  HPI  Jade Bond is a 48 y.o. y.o. woman with history of uncontrolled DM2 with neurologic manifestations, HTN, HLD, allergic rhinitis who presents for hospital follow up.  The patient was admitted to the hospital from 06/04 - 07/06/2013 for community acquired pneumonia. She was treated with IV Rocephin and IV azithromycin. She demonstrated improvement after 2 days of IV antibiotics, and she was discharged on amoxicillin 1 g 3 times a day and azithromycin 500 mg daily to complete 7 days of treatment. End date of antibiotic therapy is 07/10/2013. Given her chest imaging (CT scan without contrast most compatible with pneumonia but raised a concern of a pulmonary mass versus pulmonary infarction 2/2 PE in right upper lung), she will need follow chest imaging after 6 weeks (around mid July 2015).   Patient reports improvement since discharge from the hospital. She still has a slight cough, but it is improving since starting antibiotics, and the Tessalon is helping control it. No fevers or chills. She's been compliant with her antibiotics and also with her insulin.  We'll check a BMP today as the patient had an acute kidney injury during hospitalization. This was improving upon discharge. If her renal function is back to baseline, her ACE inhibitor can be restarted.   Current Outpatient Prescriptions on File Prior to Visit  Medication Sig Dispense Refill  . amoxicillin (AMOXIL) 500 MG capsule Take 2 capsules (1,000 mg total) by mouth 3 (three) times daily.  24 capsule  0  . aspirin 81 MG EC tablet Take 1 tablet (81 mg total) by mouth daily. Swallow whole.  30 tablet  12  . atorvastatin (LIPITOR) 40 MG tablet Take 1 tablet (40 mg total) by mouth daily.  30 tablet  6  . azithromycin (ZITHROMAX) 500 MG tablet Take 1 tablet (500 mg total) by mouth daily.  4 tablet  0  . benzonatate (TESSALON PERLES) 100 MG  capsule Take 1 capsule (100 mg total) by mouth every 6 (six) hours as needed for cough.  30 capsule  0  . Blood Glucose Monitoring Suppl (ACCU-CHEK AVIVA) device Please use device to check blood sugars 4 times a day, before meals and at bedtime.  1 each  0  . gabapentin (NEURONTIN) 300 MG capsule Take 1 capsule (300 mg total) by mouth 3 (three) times daily.  90 capsule  2  . glucose blood test strip Please use with device to check blood sugars 4 times a day, before meals and at bedtime.  100 each  12  . HYDROcodone-acetaminophen (NORCO/VICODIN) 5-325 MG per tablet Take 1-2 tablets by mouth every 6 (six) hours as needed for moderate pain.  15 tablet  0  . insulin aspart (NOVOLOG) 100 UNIT/ML injection Sliding scale as directed. Pt takes anywhere from 4-10 units when blood sugar is >150. diag code 250.62. Insulin dependent  20 mL  6  . insulin glargine (LANTUS) 100 UNIT/ML injection Inject 0.2 mLs (20 Units total) into the skin at bedtime.  20 mL  6  . Insulin Pen Needle 32G X 4 MM MISC Please use with Lantus pen to administer insulin in the morning and at night.  100 each  11  . Lancets (ACCU-CHEK SOFT TOUCH) lancets Please use with device to check blood sugars 4 times a day, before meals and at bedtime.  100 each  12  . nitroGLYCERIN (NITROSTAT) 0.4 MG  SL tablet Place 0.4 mg under the tongue every 5 (five) minutes as needed. For chest pain        Review of Systems  Constitutional: Negative for fever.  Respiratory: Negative for cough and shortness of breath.   Cardiovascular: Negative for chest pain.  Gastrointestinal: Negative for nausea and abdominal pain.  Genitourinary: Negative for dysuria.       Objective:   Physical Exam  Constitutional: She is oriented to person, place, and time. She appears well-developed and well-nourished. No distress.  HENT:  Head: Normocephalic and atraumatic.  Eyes: Conjunctivae and EOM are normal. Pupils are equal, round, and reactive to light.  Neck: Normal  range of motion. Neck supple.  Cardiovascular: Normal rate and regular rhythm.   Pulmonary/Chest: Effort normal and breath sounds normal.  Musculoskeletal: Normal range of motion. She exhibits no edema and no tenderness.  Neurological: She is alert and oriented to person, place, and time. No cranial nerve deficit.  Skin: Skin is warm and dry.  Psychiatric: She has a normal mood and affect.     Filed Vitals:   07/10/13 1506  BP: 161/90  Pulse: 68  Temp: 97.4 F (36.3 C)      Assessment & Plan:   Please see problem based charting.

## 2013-07-10 NOTE — Progress Notes (Signed)
Case discussed with Dr. Cater at the time of the visit.  We reviewed the resident's history and exam and pertinent patient test results.  I agree with the assessment, diagnosis, and plan of care documented in the resident's note.      

## 2013-07-11 LAB — BASIC METABOLIC PANEL WITH GFR
BUN: 19 mg/dL (ref 6–23)
CHLORIDE: 102 meq/L (ref 96–112)
CO2: 22 mEq/L (ref 19–32)
CREATININE: 1.6 mg/dL — AB (ref 0.50–1.10)
Calcium: 8.5 mg/dL (ref 8.4–10.5)
GFR, EST NON AFRICAN AMERICAN: 38 mL/min — AB
GFR, Est African American: 44 mL/min — ABNORMAL LOW
Glucose, Bld: 105 mg/dL — ABNORMAL HIGH (ref 70–99)
Potassium: 4.2 mEq/L (ref 3.5–5.3)
Sodium: 135 mEq/L (ref 135–145)

## 2013-07-12 NOTE — Addendum Note (Signed)
Addended by: Ryszard Socarras, [facility] W on: 07/12/2013 11:06 AM   Modules accepted: Medications

## 2013-07-17 ENCOUNTER — Encounter: Payer: 59 | Admitting: Dietician

## 2013-08-07 ENCOUNTER — Encounter: Payer: Self-pay | Admitting: Internal Medicine

## 2013-08-07 ENCOUNTER — Ambulatory Visit (INDEPENDENT_AMBULATORY_CARE_PROVIDER_SITE_OTHER): Payer: 59 | Admitting: Internal Medicine

## 2013-08-07 VITALS — BP 169/98 | HR 81 | Temp 97.9°F | Ht 67.0 in | Wt 196.0 lb

## 2013-08-07 DIAGNOSIS — I10 Essential (primary) hypertension: Secondary | ICD-10-CM

## 2013-08-07 DIAGNOSIS — E1149 Type 2 diabetes mellitus with other diabetic neurological complication: Secondary | ICD-10-CM

## 2013-08-07 DIAGNOSIS — I129 Hypertensive chronic kidney disease with stage 1 through stage 4 chronic kidney disease, or unspecified chronic kidney disease: Secondary | ICD-10-CM

## 2013-08-07 DIAGNOSIS — D649 Anemia, unspecified: Secondary | ICD-10-CM

## 2013-08-07 DIAGNOSIS — N183 Chronic kidney disease, stage 3 unspecified: Secondary | ICD-10-CM

## 2013-08-07 DIAGNOSIS — R600 Localized edema: Secondary | ICD-10-CM

## 2013-08-07 DIAGNOSIS — R609 Edema, unspecified: Secondary | ICD-10-CM

## 2013-08-07 DIAGNOSIS — J189 Pneumonia, unspecified organism: Secondary | ICD-10-CM

## 2013-08-07 LAB — BASIC METABOLIC PANEL WITH GFR
BUN: 21 mg/dL (ref 6–23)
CHLORIDE: 103 meq/L (ref 96–112)
CO2: 27 meq/L (ref 19–32)
Calcium: 8.3 mg/dL — ABNORMAL LOW (ref 8.4–10.5)
Creat: 1.65 mg/dL — ABNORMAL HIGH (ref 0.50–1.10)
GFR, Est African American: 43 mL/min — ABNORMAL LOW
GFR, Est Non African American: 37 mL/min — ABNORMAL LOW
Glucose, Bld: 256 mg/dL — ABNORMAL HIGH (ref 70–99)
Potassium: 4.9 mEq/L (ref 3.5–5.3)
SODIUM: 136 meq/L (ref 135–145)

## 2013-08-07 LAB — CBC WITH DIFFERENTIAL/PLATELET
Basophils Absolute: 0 10*3/uL (ref 0.0–0.1)
Basophils Relative: 0 % (ref 0–1)
EOS PCT: 2 % (ref 0–5)
Eosinophils Absolute: 0.1 10*3/uL (ref 0.0–0.7)
HEMATOCRIT: 33.7 % — AB (ref 36.0–46.0)
Hemoglobin: 11.6 g/dL — ABNORMAL LOW (ref 12.0–15.0)
LYMPHS ABS: 1.8 10*3/uL (ref 0.7–4.0)
LYMPHS PCT: 42 % (ref 12–46)
MCH: 30.3 pg (ref 26.0–34.0)
MCHC: 34.4 g/dL (ref 30.0–36.0)
MCV: 88 fL (ref 78.0–100.0)
MONO ABS: 0.3 10*3/uL (ref 0.1–1.0)
Monocytes Relative: 6 % (ref 3–12)
Neutro Abs: 2.2 10*3/uL (ref 1.7–7.7)
Neutrophils Relative %: 50 % (ref 43–77)
Platelets: 288 10*3/uL (ref 150–400)
RBC: 3.83 MIL/uL — AB (ref 3.87–5.11)
RDW: 14.4 % (ref 11.5–15.5)
WBC: 4.4 10*3/uL (ref 4.0–10.5)

## 2013-08-07 LAB — ANEMIA PANEL
%SAT: 27 % (ref 20–55)
ABS RETIC: 72.8 10*3/uL (ref 19.0–186.0)
Ferritin: 17 ng/mL (ref 10–291)
Folate: 9.1 ng/mL
Iron: 60 ug/dL (ref 42–145)
RBC.: 3.83 MIL/uL — AB (ref 3.87–5.11)
Retic Ct Pct: 1.9 % (ref 0.4–2.3)
TIBC: 219 ug/dL — AB (ref 250–470)
UIBC: 159 ug/dL (ref 125–400)
Vitamin B-12: 330 pg/mL (ref 211–911)

## 2013-08-07 LAB — GLUCOSE, CAPILLARY: GLUCOSE-CAPILLARY: 238 mg/dL — AB (ref 70–99)

## 2013-08-07 LAB — POCT GLYCOSYLATED HEMOGLOBIN (HGB A1C): Hemoglobin A1C: 8.9

## 2013-08-07 MED ORDER — LISINOPRIL-HYDROCHLOROTHIAZIDE 20-12.5 MG PO TABS: 1.0000 | ORAL_TABLET | Freq: Every day | ORAL | Status: DC

## 2013-08-07 NOTE — Assessment & Plan Note (Signed)
Pending repeat CXR for resolution

## 2013-08-07 NOTE — Patient Instructions (Addendum)
General Instructions: Please follow up in 3 months, at that time bring your meter  Please get CXR as soon as possible Please check sugars three times a day Please start Lisinopril 20 mg-Hydrocholorthiazide 12.5 mg for blood pressure  Take care. Great job on improving your sugars/diabetes    Treatment Goals:  Goals (1 Years of Data) as of 08/07/13   None      Progress Toward Treatment Goals:  Treatment Goal 08/07/2013  Hemoglobin A1C improved  Blood pressure deteriorated    Self Care Goals & Plans:  Self Care Goal 08/07/2013  Manage my medications take my medicines as prescribed; bring my medications to every visit; refill my medications on time  Monitor my health keep track of my blood glucose; bring my glucose meter and log to each visit; keep track of my blood pressure; bring my blood pressure log to each visit; keep track of my weight; check my feet daily  Eat healthy foods drink diet soda or water instead of juice or soda; eat more vegetables; eat foods that are low in salt; eat baked foods instead of fried foods; eat fruit for snacks and desserts; eat smaller portions  Be physically active find an activity I enjoy  Meeting treatment goals maintain the current self-care plan    Home Blood Glucose Monitoring 08/07/2013  Check my blood sugar 3 times a day  When to check my blood sugar before meals     Care Management & Community Referrals:  Referral 08/07/2013  Referrals made for care management support none needed  Referrals made to community resources none       Hypertension Hypertension, commonly called high blood pressure, is when the force of blood pumping through your arteries is too strong. Your arteries are the blood vessels that carry blood from your heart throughout your body. A blood pressure reading consists of a higher number over a lower number, such as 110/72. The higher number (systolic) is the pressure inside your arteries when your heart pumps. The lower  number (diastolic) is the pressure inside your arteries when your heart relaxes. Ideally you want your blood pressure below 120/80. Hypertension forces your heart to work harder to pump blood. Your arteries may become narrow or stiff. Having hypertension puts you at risk for heart disease, stroke, and other problems.  RISK FACTORS Some risk factors for high blood pressure are controllable. Others are not.  Risk factors you cannot control include:   Race. You may be at higher risk if you are African American.  Age. Risk increases with age.  Gender. Men are at higher risk than women before age 44 years. After age 47, women are at higher risk than men. Risk factors you can control include:  Not getting enough exercise or physical activity.  Being overweight.  Getting too much fat, sugar, calories, or salt in your diet.  Drinking too much alcohol. SIGNS AND SYMPTOMS Hypertension does not usually cause signs or symptoms. Extremely high blood pressure (hypertensive crisis) may cause headache, anxiety, shortness of breath, and nosebleed. DIAGNOSIS  To check if you have hypertension, your health care provider will measure your blood pressure while you are seated, with your arm held at the level of your heart. It should be measured at least twice using the same arm. Certain conditions can cause a difference in blood pressure between your right and left arms. A blood pressure reading that is higher than normal on one occasion does not mean that you need treatment. If one  blood pressure reading is high, ask your health care provider about having it checked again. TREATMENT  Treating high blood pressure includes making lifestyle changes and possibly taking medication. Living a healthy lifestyle can help lower high blood pressure. You may need to change some of your habits. Lifestyle changes may include:  Following the DASH diet. This diet is high in fruits, vegetables, and whole grains. It is low in  salt, red meat, and added sugars.  Getting at least 2 1/2 hours of brisk physical activity every week.  Losing weight if necessary.  Not smoking.  Limiting alcoholic beverages.  Learning ways to reduce stress. If lifestyle changes are not enough to get your blood pressure under control, your health care provider may prescribe medicine. You may need to take more than one. Work closely with your health care provider to understand the risks and benefits. HOME CARE INSTRUCTIONS  Have your blood pressure rechecked as directed by your health care provider.   Only take medicine as directed by your health care provider. Follow the directions carefully. Blood pressure medicines must be taken as prescribed. The medicine does not work as well when you skip doses. Skipping doses also puts you at risk for problems.   Do not smoke.   Monitor your blood pressure at home as directed by your health care provider. SEEK MEDICAL CARE IF:   You think you are having a reaction to medicines taken.  You have recurrent headaches or feel dizzy.  You have swelling in your ankles.  You have trouble with your vision. SEEK IMMEDIATE MEDICAL CARE IF:  You develop a severe headache or confusion.  You have unusual weakness, numbness, or feel faint.  You have severe chest or abdominal pain.  You vomit repeatedly.  You have trouble breathing. MAKE SURE YOU:   Understand these instructions.  Will watch your condition.  Will get help right away if you are not doing well or get worse. Document Released: 01/17/2005 Document Revised: 01/22/2013 Document Reviewed: 11/09/2012 [facility] Patient Information 2015 [place], [place]. This information is not intended to replace advice given to you by your health care provider. Make sure you discuss any questions you have with your health care provider.  DASH Eating Plan DASH stands for "Dietary Approaches to Stop Hypertension." The DASH eating plan is a  healthy eating plan that has been shown to reduce high blood pressure (hypertension). Additional health benefits may include reducing the risk of type 2 diabetes mellitus, heart disease, and stroke. The DASH eating plan may also help with weight loss. WHAT DO I NEED TO KNOW ABOUT THE DASH EATING PLAN? For the DASH eating plan, you will follow these general guidelines:  Choose foods with a percent daily value for sodium of less than 5% (as listed on the food label).  Use salt-free seasonings or herbs instead of table salt or sea salt.  Check with your health care provider or pharmacist before using salt substitutes.  Eat lower-sodium products, often labeled as "lower sodium" or "no salt added."  Eat fresh foods.  Eat more vegetables, fruits, and low-fat dairy products.  Choose whole grains. Look for the word "whole" as the first word in the ingredient list.  Choose fish and skinless chicken or [place] more often than red meat. Limit fish, poultry, and meat to 6 oz (170 g) each day.  Limit sweets, desserts, sugars, and sugary drinks.  Choose heart-healthy fats.  Limit cheese to 1 oz (28 g) per day.  Eat more home-cooked  food and less restaurant, buffet, and fast food.  Limit fried foods.  Cook foods using methods other than frying.  Limit canned vegetables. If you do use them, rinse them well to decrease the sodium.  When eating at a restaurant, ask that your food be prepared with less salt, or no salt if possible. WHAT FOODS CAN I EAT? Seek help from a dietitian for individual calorie needs. Grains Whole grain or whole wheat bread. Brown rice. Whole grain or whole wheat pasta. Quinoa, bulgur, and whole grain cereals. Low-sodium cereals. Corn or whole wheat flour tortillas. Whole grain cornbread. Whole grain crackers. Low-sodium crackers. Vegetables Fresh or frozen vegetables (raw, steamed, roasted, or grilled). Low-sodium or reduced-sodium tomato and vegetable juices. Low-sodium  or reduced-sodium tomato sauce and paste. Low-sodium or reduced-sodium canned vegetables.  Fruits All fresh, canned (in natural juice), or frozen fruits. Meat and Other Protein Products Ground beef (85% or leaner), grass-fed beef, or beef trimmed of fat. Skinless chicken or [place]. Ground chicken or [place]. Pork trimmed of fat. All fish and seafood. Eggs. Dried beans, peas, or lentils. Unsalted nuts and seeds. Unsalted canned beans. Dairy Low-fat dairy products, such as skim or 1% milk, 2% or reduced-fat cheeses, low-fat ricotta or cottage cheese, or plain low-fat yogurt. Low-sodium or reduced-sodium cheeses. Fats and Oils Tub margarines without trans fats. Light or reduced-fat mayonnaise and salad dressings (reduced sodium). Avocado. Safflower, olive, or canola oils. Natural peanut or almond butter. Other Unsalted popcorn and pretzels. The items listed above may not be a complete list of recommended foods or beverages. Contact your dietitian for more options. WHAT FOODS ARE NOT RECOMMENDED? Grains White bread. White pasta. White rice. Refined cornbread. Bagels and croissants. Crackers that contain trans fat. Vegetables Creamed or fried vegetables. Vegetables in a cheese sauce. Regular canned vegetables. Regular canned tomato sauce and paste. Regular tomato and vegetable juices. Fruits Dried fruits. Canned fruit in light or heavy syrup. Fruit juice. Meat and Other Protein Products Fatty cuts of meat. Ribs, chicken wings, bacon, sausage, bologna, salami, chitterlings, fatback, hot dogs, bratwurst, and packaged luncheon meats. Salted nuts and seeds. Canned beans with salt. Dairy Whole or 2% milk, cream, half-and-half, and cream cheese. Whole-fat or sweetened yogurt. Full-fat cheeses or blue cheese. Nondairy creamers and whipped toppings. Processed cheese, cheese spreads, or cheese curds. Condiments Onion and garlic salt, seasoned salt, table salt, and sea salt. Canned and packaged gravies.  Worcestershire sauce. Tartar sauce. Barbecue sauce. Teriyaki sauce. Soy sauce, including reduced sodium. Steak sauce. Fish sauce. Oyster sauce. Cocktail sauce. Horseradish. Ketchup and mustard. Meat flavorings and tenderizers. Bouillon cubes. Hot sauce. Tabasco sauce. Marinades. Taco seasonings. Relishes. Fats and Oils Butter, stick margarine, lard, shortening, ghee, and bacon fat. Coconut, palm kernel, or palm oils. Regular salad dressings. Other Pickles and olives. Salted popcorn and pretzels. The items listed above may not be a complete list of foods and beverages to avoid. Contact your dietitian for more information. WHERE CAN I FIND MORE INFORMATION? National Heart, Lung, and Blood Institute: travelstabloid.com Document Released: 01/06/2011 Document Revised: 01/22/2013 Document Reviewed: 11/21/2012 [facility] Patient Information 2015 [place], [place]. This information is not intended to replace advice given to you by your health care provider. Make sure you discuss any questions you have with your health care provider.  Hydrochlorothiazide, HCTZ capsules or tablets What is this medicine? HYDROCHLOROTHIAZIDE (hye droe klor oh THYE a zide) is a diuretic. It increases the amount of urine passed, which causes the body to lose salt and water. This medicine  is used to treat high blood pressure. It is also reduces the swelling and water retention caused by various medical conditions, such as heart, liver, or kidney disease. This medicine may be used for other purposes; ask your health care provider or pharmacist if you have questions. COMMON BRAND NAME(S): Esidrix, Ezide, HydroDIURIL, Microzide, Oretic, Zide What should I tell my health care provider before I take this medicine? They need to know if you have any of these conditions: -diabetes -gout -immune system problems, like lupus -kidney disease or kidney stones -liver disease -pancreatitis -small amount of  urine or difficulty passing urine -an unusual or allergic reaction to hydrochlorothiazide, sulfa drugs, other medicines, foods, dyes, or preservatives -pregnant or trying to get pregnant -breast-feeding How should I use this medicine? Take this medicine by mouth with a glass of water. Follow the directions on the prescription label. Take your medicine at regular intervals. Remember that you will need to pass urine frequently after taking this medicine. Do not take your doses at a time of day that will cause you problems. Do not stop taking your medicine unless your doctor tells you to. Talk to your pediatrician regarding the use of this medicine in children. Special care may be needed. Overdosage: If you think you have taken too much of this medicine contact a poison control center or emergency room at once. NOTE: This medicine is only for you. Do not share this medicine with others. What if I miss a dose? If you miss a dose, take it as soon as you can. If it is almost time for your next dose, take only that dose. Do not take double or extra doses. What may interact with this medicine? -cholestyramine -colestipol -digoxin -dofetilide -lithium -medicines for blood pressure -medicines for diabetes -medicines that relax muscles for surgery -other diuretics -steroid medicines like prednisone or cortisone This list may not describe all possible interactions. Give your health care provider a list of all the medicines, herbs, non-prescription drugs, or dietary supplements you use. Also tell them if you smoke, drink alcohol, or use illegal drugs. Some items may interact with your medicine. What should I watch for while using this medicine? Visit your doctor or health care professional for regular checks on your progress. Check your blood pressure as directed. Ask your doctor or health care professional what your blood pressure should be and when you should contact him or her. You may need to be on a  special diet while taking this medicine. Ask your doctor. Check with your doctor or health care professional if you get an attack of severe diarrhea, nausea and vomiting, or if you sweat a lot. The loss of too much body fluid can make it dangerous for you to take this medicine. You may get drowsy or dizzy. Do not drive, use machinery, or do anything that needs mental alertness until you know how this medicine affects you. Do not stand or sit up quickly, especially if you are an older patient. This reduces the risk of dizzy or fainting spells. Alcohol may interfere with the effect of this medicine. Avoid alcoholic drinks. This medicine may affect your blood sugar level. If you have diabetes, check with your doctor or health care professional before changing the dose of your diabetic medicine. This medicine can make you more sensitive to the sun. Keep out of the sun. If you cannot avoid being in the sun, wear protective clothing and use sunscreen. Do not use sun lamps or tanning beds/booths. What side  effects may I notice from receiving this medicine? Side effects that you should report to your doctor or health care professional as soon as possible: -allergic reactions such as skin rash or itching, hives, swelling of the lips, mouth, tongue, or throat -changes in vision -chest pain -eye pain -fast or irregular heartbeat -feeling faint or lightheaded, falls -gout attack -muscle pain or cramps -pain or difficulty when passing urine -pain, tingling, numbness in the hands or feet -redness, blistering, peeling or loosening of the skin, including inside the mouth -unusually weak or tired Side effects that usually do not require medical attention (report to your doctor or health care professional if they continue or are bothersome): -change in sex drive or performance -dry mouth -headache -stomach upset This list may not describe all possible side effects. Call your doctor for medical advice about  side effects. You may report side effects to FDA at 1-800-FDA-1088. Where should I keep my medicine? Keep out of the reach of children. Store at room temperature between 15 and 30 degrees C (59 and 86 degrees F). Do not freeze. Protect from light and moisture. Keep container closed tightly. Throw away any unused medicine after the expiration date. NOTE: This sheet is a summary. It may not cover all possible information. If you have questions about this medicine, talk to your doctor, pharmacist, or health care provider.  2015, [employer] Standard. (2009-09-11 12:57:37)

## 2013-08-07 NOTE — Assessment & Plan Note (Signed)
Lab Results  Component Value Date   HGBA1C 8.9 08/07/2013   HGBA1C >14.0 05/15/2013   HGBA1C >14.0 02/21/2013     Assessment: Diabetes control: fair control Progress toward A1C goal:  improved Comments: HA1C improved   Plan: Medications:  continue current medications (Lantus 20 units qhs) and SSI Home glucose monitoring: Frequency: 3 times a day Timing: before meals Instruction/counseling given: reminded to bring blood glucose meter & log to each visit and reminded to bring medications to each visit Educational resources provided: brochure Other plans: advised pt to get glucose strips and bring meter at revisit so meter can be reviewed.

## 2013-08-07 NOTE — Assessment & Plan Note (Signed)
BP Readings from Last 3 Encounters:  08/07/13 169/98  07/10/13 161/90  07/06/13 166/91    Lab Results  Component Value Date   NA 135 07/10/2013   K 4.2 07/10/2013   CREATININE 1.60* 07/10/2013    Assessment: Blood pressure control: moderately elevated Progress toward BP goal:  deteriorated Comments: not on BP meds since hospital d/c   Plan: Medications:  will restart Lisinopril 20 mg with combo HCTZ 12.5 mg qd since BP has not been controlled today or in the past  Educational resources provided: brochure Self management tools provided: home blood pressure logbook Other plans: will check BMET today and in 3 months

## 2013-08-07 NOTE — Assessment & Plan Note (Signed)
May be dependent due to patient being on her feet Will add diuretic to see if helps, encouraged leg elevation  If worsening consider TED hose If worsening or with associated sx's i.e sob consider repeat echo last echo 01/2010 with EF 60%, trace valve abnormalities

## 2013-08-07 NOTE — Assessment & Plan Note (Signed)
Pt has h/o heavy menses will check CBC and anemia panel today

## 2013-08-07 NOTE — Assessment & Plan Note (Signed)
Will check BMET today as pt recently had AKI on CKD

## 2013-08-07 NOTE — Progress Notes (Signed)
   Subjective:    Patient ID: Jade Bond, female    DOB: [birth year], 48 y.o.   MRN: [id number]  HPI Comments: 48 y.o Past Medical History Hypertension, Hyperlipidemia, h/o angina, Diabetes mellitus 2, h/o Pneumonia (CAP), CKD 3 with recent h/o AKI, anemia   She presents for HFU 1) she was in the hospital 6/4-6/6 for CAP (right upper lobe).  She states when she yawns or sneezes she feels there is something there.  Denies cough, sob, increased HR.  She is due for repeat CXR  2)She c/o leg/ankle edema.  She works at [employer] in [place] and is on her feet 10 hours a day 3) HTN-uncontrolled today 169/98.  She has not been taking Lisinopril 20 mg since hospital d/c due to AKI on CKD.  Creatinine was 2.48 on 6/4 and then 1.6 on 6/10.  Will repeat BMET today  4) DM 2 uncontrolled though improved. 05/15/2013 HA1C was >14 today HA1C is 8.9 with cbg of 238.  She is taking Lantus 20 units qhs and Novolog SSI.  She denies hypoglycemic events.  She has her meter but has not picked up new strips from the pharmacy so states meter readings are the same as last visit.   5) h/o normocytic anemia-She states she has heavy menses monthly.       Review of Systems  Respiratory: Negative for cough and shortness of breath.   Cardiovascular: Positive for leg swelling. Negative for chest pain.  Gastrointestinal: Negative for nausea.  Genitourinary: Positive for menstrual problem.       Objective:   Physical Exam  Nursing note and vitals reviewed. Constitutional: She is oriented to person, place, and time. She appears well-developed and well-nourished. She is cooperative. No distress.  HENT:  Head: Normocephalic and atraumatic.  Mouth/Throat: Oropharynx is clear and moist and mucous membranes are normal. No oropharyngeal exudate.  Eyes: Conjunctivae are normal. Pupils are equal, round, and reactive to light. Right eye exhibits no discharge. Left eye exhibits no discharge. No scleral icterus.  Cardiovascular:  Normal rate, regular rhythm, S1 normal, S2 normal and normal heart sounds.   No murmur heard. Trace lower ext edema   Pulmonary/Chest: Effort normal and breath sounds normal. No respiratory distress. She has no wheezes.  Abdominal: Soft. Bowel sounds are normal. There is no tenderness.  Neurological: She is alert and oriented to person, place, and time. Gait normal.  Skin: Skin is warm, dry and intact. No rash noted. She is not diaphoretic.  Psychiatric: She has a normal mood and affect. Her speech is normal and behavior is normal. Judgment and thought content normal. Cognition and memory are normal.          Assessment & Plan:  F/u in 3 months

## 2013-08-08 ENCOUNTER — Encounter: Payer: Self-pay | Admitting: Internal Medicine

## 2013-08-12 NOTE — Progress Notes (Signed)
Case discussed with Dr. McLean at the time of the visit.  We reviewed the resident's history and exam and pertinent patient test results.  I agree with the assessment, diagnosis, and plan of care documented in the resident's note.     

## 2013-09-26 ENCOUNTER — Encounter: Payer: Self-pay | Admitting: Internal Medicine

## 2013-10-04 ENCOUNTER — Encounter: Payer: Self-pay | Admitting: Internal Medicine

## 2013-10-04 ENCOUNTER — Ambulatory Visit: Payer: 59 | Admitting: Internal Medicine

## 2013-11-25 ENCOUNTER — Emergency Department (HOSPITAL_COMMUNITY)
Admission: EM | Admit: 2013-11-25 | Discharge: 2013-11-25 | Disposition: A | Discharge status: Home / Self Care | Payer: 59 | Attending: Emergency Medicine | Admitting: Emergency Medicine

## 2013-11-25 ENCOUNTER — Encounter (HOSPITAL_COMMUNITY): Payer: Self-pay | Admitting: Emergency Medicine

## 2013-11-25 ENCOUNTER — Emergency Department (HOSPITAL_COMMUNITY): Payer: 59

## 2013-11-25 DIAGNOSIS — I509 Heart failure, unspecified: Secondary | ICD-10-CM | POA: Insufficient documentation

## 2013-11-25 DIAGNOSIS — Z9851 Tubal ligation status: Secondary | ICD-10-CM | POA: Insufficient documentation

## 2013-11-25 DIAGNOSIS — Z794 Long term (current) use of insulin: Secondary | ICD-10-CM | POA: Insufficient documentation

## 2013-11-25 DIAGNOSIS — R0789 Other chest pain: Secondary | ICD-10-CM | POA: Insufficient documentation

## 2013-11-25 DIAGNOSIS — E119 Type 2 diabetes mellitus without complications: Secondary | ICD-10-CM | POA: Insufficient documentation

## 2013-11-25 DIAGNOSIS — R600 Localized edema: Secondary | ICD-10-CM | POA: Insufficient documentation

## 2013-11-25 DIAGNOSIS — Z8701 Personal history of pneumonia (recurrent): Secondary | ICD-10-CM | POA: Insufficient documentation

## 2013-11-25 DIAGNOSIS — Z9861 Coronary angioplasty status: Secondary | ICD-10-CM | POA: Insufficient documentation

## 2013-11-25 DIAGNOSIS — N189 Chronic kidney disease, unspecified: Secondary | ICD-10-CM | POA: Insufficient documentation

## 2013-11-25 DIAGNOSIS — I129 Hypertensive chronic kidney disease with stage 1 through stage 4 chronic kidney disease, or unspecified chronic kidney disease: Secondary | ICD-10-CM | POA: Insufficient documentation

## 2013-11-25 DIAGNOSIS — I209 Angina pectoris, unspecified: Secondary | ICD-10-CM | POA: Insufficient documentation

## 2013-11-25 DIAGNOSIS — R0602 Shortness of breath: Secondary | ICD-10-CM | POA: Insufficient documentation

## 2013-11-25 DIAGNOSIS — R000 Tachycardia, unspecified: Secondary | ICD-10-CM | POA: Insufficient documentation

## 2013-11-25 LAB — I-STAT TROPONIN, ED: TROPONIN I, POC: 0.02 ng/mL (ref 0.00–0.08)

## 2013-11-25 LAB — BASIC METABOLIC PANEL
Anion gap: 12 (ref 5–15)
BUN: 23 mg/dL (ref 6–23)
CALCIUM: 8.1 mg/dL — AB (ref 8.4–10.5)
CO2: 23 meq/L (ref 19–32)
CREATININE: 2.13 mg/dL — AB (ref 0.50–1.10)
Chloride: 96 mEq/L (ref 96–112)
GFR calc Af Amer: 31 mL/min — ABNORMAL LOW (ref >90)
GFR calc non Af Amer: 26 mL/min — ABNORMAL LOW (ref >90)
Glucose, Bld: 404 mg/dL — ABNORMAL HIGH (ref 70–99)
Potassium: 4.6 mEq/L (ref 3.7–5.3)
Sodium: 131 mEq/L — ABNORMAL LOW (ref 137–147)

## 2013-11-25 LAB — CBC WITH DIFFERENTIAL/PLATELET
Basophils Absolute: 0 10*3/uL (ref 0.0–0.1)
Basophils Relative: 0 % (ref 0–1)
EOS PCT: 2 % (ref 0–5)
Eosinophils Absolute: 0.1 10*3/uL (ref 0.0–0.7)
HEMATOCRIT: 32.4 % — AB (ref 36.0–46.0)
HEMOGLOBIN: 11.6 g/dL — AB (ref 12.0–15.0)
LYMPHS ABS: 1.7 10*3/uL (ref 0.7–4.0)
Lymphocytes Relative: 31 % (ref 12–46)
MCH: 31 pg (ref 26.0–34.0)
MCHC: 35.8 g/dL (ref 30.0–36.0)
MCV: 86.6 fL (ref 78.0–100.0)
MONO ABS: 0.5 10*3/uL (ref 0.1–1.0)
Monocytes Relative: 8 % (ref 3–12)
Neutro Abs: 3.3 10*3/uL (ref 1.7–7.7)
Neutrophils Relative %: 59 % (ref 43–77)
Platelets: 269 10*3/uL (ref 150–400)
RBC: 3.74 MIL/uL — AB (ref 3.87–5.11)
RDW: 12.6 % (ref 11.5–15.5)
WBC: 5.6 10*3/uL (ref 4.0–10.5)

## 2013-11-25 LAB — PRO B NATRIURETIC PEPTIDE: PRO B NATRI PEPTIDE: 1137 pg/mL — AB (ref 0–125)

## 2013-11-25 LAB — CBG MONITORING, ED: Glucose-Capillary: 390 mg/dL — ABNORMAL HIGH (ref 70–99)

## 2013-11-25 MED ORDER — FUROSEMIDE 20 MG PO TABS
20.0000 mg | ORAL_TABLET | Freq: Every day | ORAL | Status: DC
Start: 2013-11-25 — End: 2015-02-06

## 2013-11-25 MED ORDER — SODIUM CHLORIDE 0.9 % IV BOLUS (SEPSIS)
1000.0000 mL | Freq: Once | INTRAVENOUS | Status: AC
  Administered 2013-11-25: 1000 mL via INTRAVENOUS

## 2013-11-25 NOTE — ED Notes (Signed)
Patient transported to X-ray 

## 2013-11-25 NOTE — Discharge Instructions (Signed)
Take lasix as directed. Follow up with your doctor for further evaluation and Cardiology referral. Refer to attached documents for more information. Return to the ED with worsening or concerning symptoms.

## 2013-11-25 NOTE — ED Notes (Signed)
Pt presents with bilat pitting edema, pt states she has been off her meds including antihypertensives and DM medication. Pt states she has just gotten new insurance where she can afford medication. Pt denies SHOB. Pt states she did have palpitations and chest pain with exertion but clean cath and stress

## 2013-11-25 NOTE — ED Notes (Signed)
Patient transported to X-ray and returned without distress noted. 

## 2013-11-25 NOTE — ED Provider Notes (Signed)
CSN: [id number]     Arrival date & time 11/25/13  0029 History   First MD Initiated Contact with Patient 11/25/13 0105     Chief Complaint  Patient presents with  . Leg Swelling     (Consider location/radiation/quality/duration/timing/severity/associated sxs/prior Treatment) HPI Comments: Patient is a 48 year old female with a past medical history of hypertension, CKD, HLD, and diabetes who presents with bilateral lower extremity swelling for the past 3 days. Symptoms started gradually and remained constant since the onset. No aggravating/alleviating factors. Patient has never had this previously. Patient reports associated dyspnea on exertion for the past month. DOE is worsening as patient feels limited with walking far distances and walking up stairs. Patient reports a chest tightness when exerting herself. Patient's symptoms resolve when she rests. Patient reports having a clean heart cath and unremarkable stress test in [place] within the past 3 years. No other associated symptoms. No risk factors for PE or DVT.  She is a nonsmoker.    Past Medical History  Diagnosis Date  . Hypertension   . Hyperlipidemia   . Angina   . Diabetes mellitus   . Pneumonia 07/04/2013   Past Surgical History  Procedure Laterality Date  . Tonsillectomy  1980  . Cardiac catheterization  ~ 2008    negative, no intervention needed  . Tubal ligation  1995   Family History  Problem Relation Age of Onset  . Malignant hyperthermia Mother    History  Substance Use Topics  . Smoking status: Never Smoker   . Smokeless tobacco: Never Used  . Alcohol Use: No   OB History   Grav Para Term Preterm Abortions TAB SAB Ect Mult Living                 Review of Systems  Constitutional: Negative for fever, chills and fatigue.  HENT: Negative for trouble swallowing.   Eyes: Negative for visual disturbance.  Respiratory: Negative for shortness of breath.   Cardiovascular: Positive for chest pain and leg  swelling. Negative for palpitations.  Gastrointestinal: Negative for nausea, vomiting, abdominal pain and diarrhea.  Genitourinary: Negative for dysuria and difficulty urinating.  Musculoskeletal: Negative for arthralgias and neck pain.  Skin: Negative for color change.  Neurological: Negative for dizziness and weakness.  Psychiatric/Behavioral: Negative for dysphoric mood.      Allergies  Review of patient's allergies indicates no known allergies.  Home Medications   Prior to Admission medications   Medication Sig Start Date End Date Taking? Authorizing Provider  insulin aspart (NOVOLOG) 100 UNIT/ML injection Sliding scale as directed. Pt takes anywhere from 4-10 units when blood sugar is >150. diag code 250.62. Insulin dependent 06/27/13   Lesly Dukes, MD  insulin glargine (LANTUS) 100 UNIT/ML injection Inject 0.2 mLs (20 Units total) into the skin at bedtime. 07/06/13   Jessee Avers, MD  nitroGLYCERIN (NITROSTAT) 0.4 MG SL tablet Place 0.4 mg under the tongue every 5 (five) minutes as needed. For chest pain    Historical Provider, MD   BP 193/100  Pulse 105  Temp(Src) 98.5 F (36.9 C) (Oral)  Resp 20  Wt 200 lb 6.4 oz (90.901 kg)  SpO2 100%  LMP 11/09/2013 Physical Exam  Nursing note and vitals reviewed. Constitutional: She is oriented to person, place, and time. She appears well-developed and well-nourished. No distress.  HENT:  Head: Normocephalic and atraumatic.  Eyes: Conjunctivae and EOM are normal.  Neck: Normal range of motion.  Cardiovascular: Regular rhythm.  Exam reveals no  gallop and no friction rub.   Murmur heard. Tachycardic. Late systolic murmur noted.   Pulmonary/Chest: Effort normal and breath sounds normal. She has no wheezes. She has no rales. She exhibits no tenderness.  Abdominal: Soft. There is no tenderness.  Musculoskeletal: Normal range of motion.  2+ bilateral lower extremity pitting edema. No calf or popliteal tenderness to palpation.    Neurological: She is alert and oriented to person, place, and time. Coordination normal.  Speech is goal-oriented. Moves limbs without ataxia.   Skin: Skin is warm and dry.  Psychiatric: She has a normal mood and affect. Her behavior is normal.    ED Course  Procedures (including critical care time) Labs Review Labs Reviewed  CBC WITH DIFFERENTIAL - Abnormal; Notable for the following:    RBC 3.74 (*)    Hemoglobin 11.6 (*)    HCT 32.4 (*)    All other components within normal limits  BASIC METABOLIC PANEL - Abnormal; Notable for the following:    Sodium 131 (*)    Glucose, Bld 404 (*)    Creatinine, Ser 2.13 (*)    Calcium 8.1 (*)    GFR calc non Af Amer 26 (*)    GFR calc Af Amer 31 (*)    All other components within normal limits  PRO B NATRIURETIC PEPTIDE - Abnormal; Notable for the following:    Pro B Natriuretic peptide (BNP) 1137.0 (*)    All other components within normal limits  CBG MONITORING, ED - Abnormal; Notable for the following:    Glucose-Capillary 390 (*)    All other components within normal limits  I-STAT TROPOININ, ED    Imaging Review Dg Chest 2 View  11/25/2013   CLINICAL DATA:  Shortness of breath, leg swelling.  EXAM: CHEST  2 VIEW  COMPARISON:  07/04/2013 CT  FINDINGS: The heart size and mediastinal contours are within normal limits. Mild residual right upper lobe opacity may reflect scarring. No pleural effusion or pneumothorax. The visualized skeletal structures are unremarkable.  IMPRESSION: Mild residual right upper lobe opacity may reflect scarring. Otherwise, no radiographic evidence of active cardiopulmonary disease.   Electronically Signed   By: Carlos Levering M.D.   On: 11/25/2013 02:19     EKG Interpretation   Date/Time:  Monday November 25 2013 03:50:47 EDT Ventricular Rate:  90 PR Interval:  140 QRS Duration: 69 QT Interval:  370 QTC Calculation: 453 R Axis:   39 Text Interpretation:  Sinus rhythm Probable anteroseptal infarct,  recent  Non-specific ST-t changes When compared with ECG of 07/04/2013, No  significant change was found Confirmed by [facility]  MD, DAVID ([id number]) on  11/25/2013 3:56:20 AM      MDM   Final diagnoses:  SOB (shortness of breath)  CHF (congestive heart failure), NYHA class I, unspecified failure chronicity, unspecified type    2:53 AM Patient labs show elevated glucose at 404 without signs of DKA. Troponin negative. Mild elevation of BNP. Patient mildly tachycardic with remaining vitals stable.   4:52 AM I spoke with Dr. Roxanne Mins about the patient who recommends initiation of lasix for mild CHF. Patient will have PCP follow up for Cardiology referral. Chest xray shows right upper lobe scarring from previous pneumonia. Patient is agreeable to plan and agrees to return to the ED with worsening or concerning symptoms.   Alvina Chou, [place] 11/25/13 [phone]

## 2013-11-25 NOTE — ED Provider Notes (Signed)
48 year old female comes in with progressive leg swelling over the last several days. There is no associated dyspnea or chest pain. Exam, she does have 2+ pitting edema. Lungs are clear. Heart has regular rate and rhythm with a 2/6 late systolic murmur heard at the apex with radiation to the base and consistent with a flow murmur. BNP is moderately elevated. She will need to be treated with furosemide. Of note, she does have history of having had a cardiac catheterization at a hospital [place]. Records have been requested from that hospital but as far as I could tell, have not been obtained. She is advised to stay on a low-salt diet and is to follow-up with her physician in the internal medicine clinic.  Medical screening examination/treatment/procedure(s) were conducted as a shared visit with non-physician practitioner(s) and myself.  I personally evaluated the patient during the encounter.   EKG Interpretation   Date/Time:  Monday November 25 2013 03:50:47 EDT Ventricular Rate:  90 PR Interval:  140 QRS Duration: 69 QT Interval:  370 QTC Calculation: 453 R Axis:   39 Text Interpretation:  Sinus rhythm Probable anteroseptal infarct, recent  Non-specific ST-t changes When compared with ECG of 07/04/2013, No  significant change was found Confirmed by [facility]  MD, Brookelle Pellicane ([id number]) on  11/25/2013 3:56:20 AM        Delora Fuel, MD 03/55/97 [id number]

## 2013-11-25 NOTE — ED Notes (Signed)
Awake. Verbally responsive. Resp even and unlabored. ABC's intact. Occ nonproductive cough.

## 2013-11-29 ENCOUNTER — Encounter: Payer: Self-pay | Admitting: Internal Medicine

## 2013-11-29 ENCOUNTER — Ambulatory Visit (INDEPENDENT_AMBULATORY_CARE_PROVIDER_SITE_OTHER): Payer: Self-pay | Admitting: Internal Medicine

## 2013-11-29 VITALS — BP 162/102 | HR 76 | Ht 67.0 in | Wt 194.4 lb

## 2013-11-29 DIAGNOSIS — IMO0002 Reserved for concepts with insufficient information to code with codable children: Secondary | ICD-10-CM

## 2013-11-29 DIAGNOSIS — E785 Hyperlipidemia, unspecified: Secondary | ICD-10-CM

## 2013-11-29 DIAGNOSIS — E1165 Type 2 diabetes mellitus with hyperglycemia: Secondary | ICD-10-CM

## 2013-11-29 DIAGNOSIS — N183 Chronic kidney disease, stage 3 unspecified: Secondary | ICD-10-CM

## 2013-11-29 DIAGNOSIS — E8809 Other disorders of plasma-protein metabolism, not elsewhere classified: Secondary | ICD-10-CM

## 2013-11-29 DIAGNOSIS — R072 Precordial pain: Secondary | ICD-10-CM

## 2013-11-29 DIAGNOSIS — I10 Essential (primary) hypertension: Secondary | ICD-10-CM

## 2013-11-29 DIAGNOSIS — R600 Localized edema: Secondary | ICD-10-CM

## 2013-11-29 LAB — COMPREHENSIVE METABOLIC PANEL
ALBUMIN: 2.1 g/dL — AB (ref 3.5–5.2)
ALT: 9 U/L (ref 0–35)
AST: 12 U/L (ref 0–37)
Alkaline Phosphatase: 77 U/L (ref 39–117)
BUN: 26 mg/dL — ABNORMAL HIGH (ref 6–23)
CHLORIDE: 97 meq/L (ref 96–112)
CO2: 25 mEq/L (ref 19–32)
Calcium: 8.2 mg/dL — ABNORMAL LOW (ref 8.4–10.5)
Creatinine, Ser: 2.1 mg/dL — ABNORMAL HIGH (ref 0.4–1.2)
GFR: 31.74 mL/min — ABNORMAL LOW (ref >60.00)
Glucose, Bld: 440 mg/dL — ABNORMAL HIGH (ref 70–99)
POTASSIUM: 4 meq/L (ref 3.5–5.1)
SODIUM: 128 meq/L — AB (ref 135–145)
Total Bilirubin: 0.3 mg/dL (ref 0.2–1.2)
Total Protein: 6.4 g/dL (ref 6.0–8.3)

## 2013-11-29 LAB — LIPID PANEL
CHOL/HDL RATIO: 10
Cholesterol: 385 mg/dL — ABNORMAL HIGH (ref 0–200)
HDL: 37.6 mg/dL — ABNORMAL LOW (ref >39.00)
NONHDL: 347.4
Triglycerides: 325 mg/dL — ABNORMAL HIGH (ref 0.0–149.0)
VLDL: 65 mg/dL — ABNORMAL HIGH (ref 0.0–40.0)

## 2013-11-29 LAB — BRAIN NATRIURETIC PEPTIDE: Pro B Natriuretic peptide (BNP): 50 pg/mL (ref 0.0–100.0)

## 2013-11-29 LAB — TSH: TSH: 0.56 u[IU]/mL (ref 0.35–4.50)

## 2013-11-29 LAB — LDL CHOLESTEROL, DIRECT: Direct LDL: 236.6 mg/dL

## 2013-11-29 MED ORDER — METOPROLOL SUCCINATE ER 50 MG PO TB24: 50.0000 mg | ORAL_TABLET | Freq: Every day | ORAL | Status: DC

## 2013-11-29 NOTE — Patient Instructions (Signed)
Your physician has recommended you make the following change in your medication:   START TAKING METOPROLOL 50 MG ONCE A DAY    Your physician recommends that you return for lab work TODAY  TSH BNP LIPIDS CMET  AND 24 HOUR URINE PROTEIN COLLECTOR   Your physician has requested that you have an echocardiogram. Echocardiography is a painless test that uses sound waves to create images of your heart. It provides your doctor with information about the size and shape of your heart and how well your heart's chambers and valves are working. This procedure takes approximately one hour. There are no restrictions for this procedure.   Your physician has requested that you have a lexiscan myoview. For further information please visit [email or web address]. Please follow instruction sheet, as given.   Your physician recommends that you schedule a follow-up appointment in: [place] FOR CKD  REFFERAL TO ENDOCRINOLOGY DR [place]

## 2013-11-29 NOTE — Progress Notes (Signed)
[place], [place], [place]  [address] Phone: [phone] Fax:  [phone]  Date:  11/29/2013   Patient ID:  Jade Bond, DOB 10-Jul-1965, MRN [id number]   PCP:  Dellia Nims, MD  Cardiologist:  New, being seen by Dr. Harrington Challenger  History of Present Illness: Jade Bond is a 48 y.o. female with history of uncontrolled DM, HTN, HLD, CKD stage III, normocytic anemia (heavy menses), normal nuc 03/2011 who presents to clinic for evaluation of LE edema. She has not been seen by cardiology locally before - our team proctored the nuclear stress test back in 2013 but had not seen her in consult. 2D echo 01/2010 - mild TR, LVEF 60% with normal wall thickness, trace MR, mild TR, normal RV, trace pericardial effusion. She thinks she had a heart catheterization about 5-6 years ago with no significant findings but I do not have records. She is originally from [place] but moved to [place] to care for her mother.  She first noticed the leg swelling about 3 months ago. She works in Scientist, clinical (histocompatibility and immunogenetics). She originally attributed this to working on her feet long days but even on days when she's sedentary the swelling has persisted. She makes the drive up to [place] quite often. She denies prior LE duplex. She was seen in the ED 11/25/13 for LEE. Troponin was negative, Hgb stable compared to prior (11.6), Na 131 but correcting to normal with CBG 404, Cr 2.13, calcium 8.1. (Cr ranges previously 1.6-2.48). Of note, CMET in 07/2013 showed albumin of 1.6. UA at that time showed >300 protein. Per PCP notes her ACEI had been held in the past due to AKI. She was supposed to restart lisinopril/HCTZ in July 2015 but had not done so - this may actually be helpful to [place] because we could check a 24 hour urine protein before ACEI is on board. She was placed on 20mg  Lasix daily and has had resolution of her edema and 6lb weight loss.  She also notices chest pain when she exerts herself. She will feel her heart rate speed up  when moving quickly and this feels like a chest pressure. She does notice occasional DOE. No nausea, vomiting, presyncope, and syncope. BP elevated in clinic today.  Recent Labs: 02/21/2013: HDL Cholesterol by NMR 49; LDL (calc) 187*  07/05/2013: ALT 7  11/25/2013: Creatinine 2.13*; Hemoglobin 11.6*; Potassium 4.6; Pro B Natriuretic peptide (BNP) 1137.0*   Wt Readings from Last 3 Encounters:  11/29/13 194 lb 6.4 oz (88.179 kg)  11/25/13 200 lb 6.4 oz (90.901 kg)  08/07/13 196 lb (88.905 kg)     Past Medical History  Diagnosis Date  . Hypertension   . Hyperlipidemia   . Diabetes mellitus   . Pneumonia 07/04/2013  . CKD (chronic kidney disease), stage III   . Normocytic anemia   . Chest pain     a. 2D echo 01/2010 - mild TR, LVEF 60% with normal wall thickness, trace MR, mild TR, normal RV, trace pericardial effusion. b. Normal nuc 03/2011 at [facility].  . Hypoalbuminemia     Current Outpatient Prescriptions  Medication Sig Dispense Refill  . aspirin 81 MG tablet Take 81 mg by mouth daily.      . furosemide (LASIX) 20 MG tablet Take 1 tablet (20 mg total) by mouth daily.  30 tablet  0  . insulin aspart (NOVOLOG) 100 UNIT/ML injection Sliding scale as directed. Pt takes anywhere from 4-10 units when blood sugar is >150.  diag code 250.62. Insulin dependent  20 mL  6  . insulin glargine (LANTUS) 100 UNIT/ML injection Inject 0.2 mLs (20 Units total) into the skin at bedtime.  20 mL  6  . nitroGLYCERIN (NITROSTAT) 0.4 MG SL tablet Place 0.4 mg under the tongue every 5 (five) minutes as needed. For chest pain       No current facility-administered medications for this visit.    Allergies:   Review of patient's allergies indicates no known allergies.   Social History:  The patient  reports that she has never smoked. She has never used smokeless tobacco. She reports that she drinks alcohol. She reports that she does not use illicit drugs.   Family History:  The patient's family history  includes Diabetes Mellitus II in her mother and sister; Heart attack in her father; Hypertension in her sister; Kidney disease in her father; Malignant hyperthermia in her mother.   ROS:  Please see the history of present illness. All other systems reviewed and negative.   PHYSICAL EXAM: Patient is a 48 yo in NAD   VS:  BP 162/102  Pulse 76  Ht 5\' 7"  (1.702 m)  Wt 194 lb 6.4 oz (88.179 kg)  BMI 30.44 kg/m2  LMP 11/09/2013 Well nourished, well developed, in no acute distress HEENT: normal Neck: no JVD Cardiac:  normal S1, S2; RRR; no murmur Lungs:  clear to auscultation bilaterally, no wheezing, rhonchi or rales Abd: soft, nontender, no hepatomegaly Ext: no edema Skin: warm and dry Neuro:  moves all extremities spontaneously, no focal abnormalities noted  EKG:  NSR 76bpm - possible LAE, septal infarct age undetermined, TWI I, avL, nonspecific ST sagging in lead avF. No significant change from prior  ASSESSMENT AND PLAN:   1. Edema   Patien's edema has improved.  WIll evid of hypoalbuminemia and signif kidney disease.  WIll repeat electrolytes to determine if she is on correct regimen  Will sched for a 24 hour urine.  Will refer to [facility].   Will sched for echo 2. Chest pain Somewhat atypical  Concerning with history  Renal function would make invasive eval risky  WIll set up for myoview 3. HTN  High  Will add metoprolol 2 x per day  Needs follow up as well as input from renal.   4. Uncontrolled diabetes mellitus 5. HTN 6. Hyperlipidemia   Signed, Melina Copa, PA-C  11/29/2013 2:31 PM   Patient seen and examined  I have amended note and and assessment to reflect my findings.   Dorris Carnes

## 2013-12-05 ENCOUNTER — Ambulatory Visit (HOSPITAL_BASED_OUTPATIENT_CLINIC_OR_DEPARTMENT_OTHER): Payer: Self-pay | Admitting: Radiology

## 2013-12-05 ENCOUNTER — Ambulatory Visit (HOSPITAL_COMMUNITY): Payer: Self-pay | Attending: Internal Medicine | Admitting: Radiology

## 2013-12-05 DIAGNOSIS — R072 Precordial pain: Secondary | ICD-10-CM

## 2013-12-05 DIAGNOSIS — Z794 Long term (current) use of insulin: Secondary | ICD-10-CM | POA: Insufficient documentation

## 2013-12-05 DIAGNOSIS — R0789 Other chest pain: Secondary | ICD-10-CM | POA: Insufficient documentation

## 2013-12-05 DIAGNOSIS — E1165 Type 2 diabetes mellitus with hyperglycemia: Secondary | ICD-10-CM

## 2013-12-05 DIAGNOSIS — I10 Essential (primary) hypertension: Secondary | ICD-10-CM | POA: Insufficient documentation

## 2013-12-05 DIAGNOSIS — E119 Type 2 diabetes mellitus without complications: Secondary | ICD-10-CM | POA: Insufficient documentation

## 2013-12-05 DIAGNOSIS — R600 Localized edema: Secondary | ICD-10-CM

## 2013-12-05 MED ORDER — TECHNETIUM TC 99M SESTAMIBI GENERIC - CARDIOLITE
30.0000 | Freq: Once | INTRAVENOUS | Status: AC | PRN
  Administered 2013-12-05: 30 via INTRAVENOUS

## 2013-12-05 MED ORDER — REGADENOSON 0.4 MG/5ML IV SOLN
0.4000 mg | Freq: Once | INTRAVENOUS | Status: AC
Start: 2013-12-05 — End: 2013-12-05
  Administered 2013-12-05: 0.4 mg via INTRAVENOUS

## 2013-12-05 MED ORDER — TECHNETIUM TC 99M SESTAMIBI GENERIC - CARDIOLITE
10.0000 | Freq: Once | INTRAVENOUS | Status: AC | PRN
  Administered 2013-12-05: 10 via INTRAVENOUS

## 2013-12-05 NOTE — Progress Notes (Signed)
Echocardiogram performed.  

## 2013-12-05 NOTE — Progress Notes (Signed)
[facility] [address] [place], [place] [address] [phone]    Cardiology Nuclear Med Study  Jade Bond is a 48 y.o. 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 IV 0.9% NS with Angio Cath:  22g  IV Site: R Hand  IV Started by:  Crissie Figures, RN  Chest Size (in):  40 Cup Size: D  Height: 5\' 7"  (1.702 m)  Weight:  191 lb (86.637 kg)  BMI:  Body mass index is 29.91 kg/(m^2). Tech Comments:  CBG@11 :30 AM=295    Nuclear Med Study 1 or 2 day study: 1 day  Stress Test Type:  Lexiscan  Reading MD: N/A  Order Authorizing Provider:  Dorris Carnes, MD  Resting Radionuclide: Technetium 78m Sestamibi  Resting Radionuclide Dose: 11.0 mCi   Stress Radionuclide:  Technetium 9m Sestamibi  Stress Radionuclide Dose: 33.0 mCi           Stress Protocol Rest HR: 82 Stress HR: 151  Rest BP: 176/101 Stress BP: 217/98  Exercise Time (min): n/a METS: n/a   Predicted Max HR: 173 bpm % Max HR: 87.28 bpm Rate Pressure Product: 34277   Dose of Adenosine (mg):  n/a Dose of Lexiscan: 0.4 mg  Dose of Atropine (mg): n/a Dose of Dobutamine: n/a mcg/kg/min (at max HR)  Stress Test Technologist: Glade Lloyd, BS-ES  Nuclear Technologist:  Earl Many, CNMT     Rest Procedure:  Myocardial perfusion imaging was performed at rest 45 minutes following the intravenous administration of Technetium 105m Sestamibi. Rest ECG: SR 82 bpm    Stress Procedure:  The patient received IV Lexiscan 0.4 mg over 15-seconds with concurrent low level exercise and then  Technetium 26m Sestamibi was injected at 30-seconds while the patient continued walking one more minute.  Quantitative spect images were obtained after a 45-minute delay.  During the infusion of Lexiscan the patient complained of SOB that resolved in recovery.  Stress ECG: No significant change from baseline ECG  QPS Raw Data Images:  Soft tissue (diaphragm, bowel acitvity underlie hear.t   Stress Images:  Normal homogeneous uptake in all areas of the myocardium. Rest Images:  Normal homogeneous uptake in all areas of the myocardium. Subtraction (SDS):  No evidence of ischemia. Transient Ischemic Dilatation (Normal <1.22):  0.91 Lung/Heart Ratio (Normal <0.45):  0.25  Quantitative Gated Spect Images QGS EDV:  63 ml QGS ESV:  29 ml  Impression Exercise Capacity:  Lexiscan with low level exercise. BP Response:  Hypertensive blood pressure response. Clinical Symptoms:  No chest pain. ECG Impression:  No significant ST segment change suggestive of ischemia. Comparison with Prior Nuclear Study:No significant change   Overall Impression:  Normal stress nuclear study.  LV Ejection Fraction: 53%.  LV Wall Motion: mild inferior hypokinesis.  Consider echo to further define wall motion and LVEF  Dorris Carnes

## 2013-12-20 ENCOUNTER — Telehealth: Payer: Self-pay | Admitting: *Deleted

## 2013-12-20 DIAGNOSIS — E785 Hyperlipidemia, unspecified: Secondary | ICD-10-CM

## 2013-12-20 DIAGNOSIS — I10 Essential (primary) hypertension: Secondary | ICD-10-CM

## 2013-12-20 NOTE — Telephone Encounter (Signed)
-----   Message from Fay Records, MD sent at 12/03/2013 11:53 PM EST ----- Thyrod function is normal Keep on current meds  Sodium is a little low.  Repeat end of week Kidney function appears stable  Down some  Working to get appt in [place] [facility] Cholesterol is SEVERELY elevated.  Needs to be on statin  Esp since diabetic Would treat with Lipitor 80 mg   F/U lipids and AST in 8 wks

## 2013-12-20 NOTE — Telephone Encounter (Signed)
Was unable to reach pt on home #. Left message for patient to call back with Emergency Contact Ina Homes. She thinks right now pt's phone is off, but she can get a message to her. Asked her to please ask patient to call me today.

## 2013-12-23 ENCOUNTER — Encounter: Payer: 59 | Admitting: Internal Medicine

## 2013-12-23 ENCOUNTER — Emergency Department (HOSPITAL_COMMUNITY)
Admission: EM | Admit: 2013-12-23 | Discharge: 2013-12-23 | Disposition: A | Discharge status: Home / Self Care | Payer: Self-pay | Attending: Emergency Medicine | Admitting: Emergency Medicine

## 2013-12-23 ENCOUNTER — Emergency Department (HOSPITAL_COMMUNITY): Payer: Self-pay

## 2013-12-23 ENCOUNTER — Encounter (HOSPITAL_COMMUNITY): Payer: Self-pay | Admitting: Emergency Medicine

## 2013-12-23 DIAGNOSIS — N183 Chronic kidney disease, stage 3 (moderate): Secondary | ICD-10-CM | POA: Insufficient documentation

## 2013-12-23 DIAGNOSIS — Z8701 Personal history of pneumonia (recurrent): Secondary | ICD-10-CM | POA: Insufficient documentation

## 2013-12-23 DIAGNOSIS — R05 Cough: Secondary | ICD-10-CM | POA: Insufficient documentation

## 2013-12-23 DIAGNOSIS — R011 Cardiac murmur, unspecified: Secondary | ICD-10-CM | POA: Insufficient documentation

## 2013-12-23 DIAGNOSIS — J3489 Other specified disorders of nose and nasal sinuses: Secondary | ICD-10-CM | POA: Insufficient documentation

## 2013-12-23 DIAGNOSIS — Z7982 Long term (current) use of aspirin: Secondary | ICD-10-CM | POA: Insufficient documentation

## 2013-12-23 DIAGNOSIS — M791 Myalgia: Secondary | ICD-10-CM | POA: Insufficient documentation

## 2013-12-23 DIAGNOSIS — R059 Cough, unspecified: Secondary | ICD-10-CM

## 2013-12-23 DIAGNOSIS — I129 Hypertensive chronic kidney disease with stage 1 through stage 4 chronic kidney disease, or unspecified chronic kidney disease: Secondary | ICD-10-CM | POA: Insufficient documentation

## 2013-12-23 DIAGNOSIS — Z862 Personal history of diseases of the blood and blood-forming organs and certain disorders involving the immune mechanism: Secondary | ICD-10-CM | POA: Insufficient documentation

## 2013-12-23 DIAGNOSIS — Z794 Long term (current) use of insulin: Secondary | ICD-10-CM | POA: Insufficient documentation

## 2013-12-23 DIAGNOSIS — E119 Type 2 diabetes mellitus without complications: Secondary | ICD-10-CM | POA: Insufficient documentation

## 2013-12-23 DIAGNOSIS — I509 Heart failure, unspecified: Secondary | ICD-10-CM | POA: Insufficient documentation

## 2013-12-23 DIAGNOSIS — Z9889 Other specified postprocedural states: Secondary | ICD-10-CM | POA: Insufficient documentation

## 2013-12-23 HISTORY — DX: Heart failure, unspecified: I50.9

## 2013-12-23 MED ORDER — GUAIFENESIN-CODEINE 100-10 MG/5ML PO SOLN: 5.0000 mL | Freq: Three times a day (TID) | ORAL | Status: DC | PRN

## 2013-12-23 NOTE — ED Notes (Addendum)
Pt A+Ox4, reports cough/cold symptoms since yesterday, generalized body aches 7/10 pain.  Non productive cough.  Reports hx pneumonia in July, completed abx.  Speaking full/clear sentences, rr even/un-lab.  MAEI.  Ambulatory with steady gait.  Skin PWD.

## 2013-12-23 NOTE — Discharge Instructions (Signed)
Cough, Adult  A cough is a reflex. It helps you clear your throat and airways. A cough can help heal your body. A cough can last 2 or 3 weeks (acute) or may last more than 8 weeks (chronic). Some common causes of a cough can include an infection, allergy, or a cold. HOME CARE  Only take medicine as told by your doctor.  If given, take your medicines (antibiotics) as told. Finish them even if you start to feel better.  Use a cold steam vaporizer or humidifier in your home. This can help loosen thick spit (secretions).  Sleep so you are almost sitting up (semi-upright). Use pillows to do this. This helps reduce coughing.  Rest as needed.  Stop smoking if you smoke. GET HELP RIGHT AWAY IF:  You have yellowish-white fluid (pus) in your thick spit.  Your cough gets worse.  Your medicine does not reduce coughing, and you are losing sleep.  You cough up blood.  You have trouble breathing.  Your pain gets worse and medicine does not help.  You have a fever. MAKE SURE YOU:   Understand these instructions.  Will watch your condition.  Will get help right away if you are not doing well or get worse. Document Released: 09/30/2010 Document Revised: 06/03/2013 Document Reviewed: 09/30/2010 [facility] Patient Information 2015 [place], [place]. This information is not intended to replace advice given to you by your health care provider. Make sure you discuss any questions you have with your health care provider.   Your evaluated in the ED today for your cough and bodyaches. There does not appear to be an emergent cause for your symptoms. There is no evidence of pneumonia or worsening of your CHF on the chest x-ray. We will continue treating your symptoms with supportive care. Take your cough medicine syrup as directed for cough and bodyaches. Please follow-up with your primary care within 3-5 days for further evaluation and management. Return to ED if symptoms worsen or you experience fevers,  shortness of breath, chest pain, numbness weakness

## 2013-12-23 NOTE — ED Notes (Signed)
Pt to CXR.

## 2013-12-23 NOTE — ED Provider Notes (Signed)
CSN: [id number]     Arrival date & time 12/23/13  1237 History  This chart was scribed for non-physician practitioner, Comer Locket, PA-C working with Virgel Manifold, MD by Frederich Balding, ED scribe. This patient was seen in room WTR6/WTR6 and the patient's care was started at 1:05 PM.   Chief Complaint  Patient presents with  . URI    onset yesterday  . Generalized Body Aches    onset yesterday   The history is provided by the patient. No language interpreter was used.    HPI Comments: Jade Bond is a 48 y.o. female who presents to the Emergency Department complaining of generalized body aches and rhinorrhea that started yesterday. Also reports productive cough that started this morning. Jade Bond has taken tussin cough medication, benadryl and alka seltzer plus with no relief. States Jade Bond had subjective fever yesterday but it is now resolved. Denies sore throat, facial pain, trouble breathing, SOB. Denies unilateral leg swelling, history of blood clots, recent travel or surgery. Pt does not take birth control pills or smoke cigarettes.   Past Medical History  Diagnosis Date  . Hypertension   . Hyperlipidemia   . Diabetes mellitus   . Pneumonia 07/04/2013  . CKD (chronic kidney disease), stage III   . Normocytic anemia   . Chest pain     a. 2D echo 01/2010 - mild TR, LVEF 60% with normal wall thickness, trace MR, mild TR, normal RV, trace pericardial effusion. b. Normal nuc 03/2011 at [facility].  . Hypoalbuminemia   . CHF (congestive heart failure)    Past Surgical History  Procedure Laterality Date  . Tonsillectomy  1980  . Cardiac catheterization  ~ 2008    negative, no intervention needed  . Tubal ligation  1995   Family History  Problem Relation Age of Onset  . Malignant hyperthermia Mother   . Heart attack Father     Age 33  . Diabetes Mellitus II Mother   . Kidney disease Father   . Diabetes Mellitus II Sister   . Hypertension Sister    History  Substance Use Topics  .  Smoking status: Never Smoker   . Smokeless tobacco: Never Used  . Alcohol Use: Yes     Comment: occasional drink of wine - 1 glass/week or less   OB History    No data available     Review of Systems  Constitutional: Negative for fever.  HENT: Positive for rhinorrhea. Negative for sore throat.   Respiratory: Positive for cough. Negative for shortness of breath.   Cardiovascular: Negative for leg swelling.  Musculoskeletal: Positive for myalgias.  All other systems reviewed and are negative.  Allergies  Review of patient's allergies indicates no known allergies.  Home Medications   Prior to Admission medications   Medication Sig Start Date End Date Taking? Authorizing Provider  aspirin 81 MG tablet Take 81 mg by mouth daily.   Yes Historical Provider, MD  furosemide (LASIX) 20 MG tablet Take 1 tablet (20 mg total) by mouth daily. 11/25/13  Yes Kaitlyn Szekalski, PA-C  insulin aspart (NOVOLOG) 100 UNIT/ML injection Sliding scale as directed. Pt takes anywhere from 4-10 units when blood sugar is >150. diag code 250.62. Insulin dependent 06/27/13  Yes Pollie Friar, MD  insulin glargine (LANTUS) 100 UNIT/ML injection Inject 0.2 mLs (20 Units total) into the skin at bedtime. 07/06/13  Yes Jessee Avers, MD  metoprolol succinate (TOPROL-XL) 50 MG 24 hr tablet Take 1 tablet (50 mg total) by mouth  daily. Take with or immediately following a meal. 11/29/13  Yes Fay Records, MD  nitroGLYCERIN (NITROSTAT) 0.4 MG SL tablet Place 0.4 mg under the tongue every 5 (five) minutes as needed. For chest pain   Yes Historical Provider, MD  guaiFENesin-codeine 100-10 MG/5ML syrup Take 5 mLs by mouth 3 (three) times daily as needed for cough. 12/23/13   Viona Gilmore Simisola Sandles, PA-C   BP 159/87 mmHg  Pulse 98  Temp(Src) 98.4 F (36.9 C) (Oral)  Resp 20  Ht 5\' 7"  (1.702 m)  Wt 196 lb (88.905 kg)  BMI 30.69 kg/m2  SpO2 96%  LMP 12/05/2013   Physical Exam  Constitutional: Jade Bond is oriented to person,  place, and time. Jade Bond appears well-developed and well-nourished. No distress.  HENT:  Head: Normocephalic and atraumatic.  Eyes: Conjunctivae and EOM are normal.  Neck: Neck supple. No tracheal deviation present.  Cardiovascular: Normal rate and regular rhythm.   Murmur heard. 2/6 systolic injection murmur. Patient not tachycardic on my exam  Pulmonary/Chest: Effort normal and breath sounds normal. No respiratory distress. Jade Bond has no wheezes. Jade Bond has no rhonchi. Jade Bond has no rales.  Musculoskeletal: Normal range of motion.  No lower extremity edema.   Neurological: Jade Bond is alert and oriented to person, place, and time.  Skin: Skin is warm and dry.  Psychiatric: Jade Bond has a normal mood and affect. Her behavior is normal.  Nursing note and vitals reviewed.   ED Course  Procedures (including critical care time)  DIAGNOSTIC STUDIES: Oxygen Saturation is 93% on RA, adequate by my interpretation.    COORDINATION OF CARE: 1:14 PM-Discussed treatment plan which includes chest xray with pt at bedside and pt agreed to plan.   Labs Review Labs Reviewed - No data to display  Imaging Review Dg Chest 2 View  12/23/2013   CLINICAL DATA:  Cough, shortness of breath.  EXAM: CHEST  2 VIEW  COMPARISON:  November 27, 2013.  FINDINGS: The heart size and mediastinal contours are within normal limits. Both lungs are clear. No pneumothorax or pleural effusion is noted. The visualized skeletal structures are unremarkable.  IMPRESSION: No acute cardiopulmonary abnormality seen.   Electronically Signed   By: Sabino Dick M.D.   On: 12/23/2013 14:00     EKG Interpretation None      MDM  Vitals stable - WNL -afebrile Pt resting comfortably in ED. PE--not concerning further acute or emergent pathology. Patient is perc negative. Denies any chest pain at this time. Given timing and symptom onset, symptomology likely due to viral etiology. Likely URI. Low concern for pneumonia, pneumothorax, ACS,  PE Imaging--chest x-ray shows no acute cardio pulmonary pathology   Discussed f/u with PCP and return precautions, pt very amenable to plan. Patient stable, in good condition and is appropriate for discharge  Final diagnoses:  Cough      I personally performed the services described in this documentation, which was scribed in my presence. The recorded information has been reviewed and is accurate.  Verl Dicker, PA-C 12/24/13 [place], DO 12/24/13 [phone]

## 2014-01-01 ENCOUNTER — Encounter: Payer: 59 | Admitting: Internal Medicine

## 2014-01-02 NOTE — Telephone Encounter (Signed)
Left message for patient to call back. She has upcoming appointment with [place] message of lab notes above via My Chart and request pt call the office to discuss.

## 2014-01-03 ENCOUNTER — Encounter: Payer: Self-pay | Admitting: Internal Medicine

## 2014-01-03 MED ORDER — ATORVASTATIN CALCIUM 80 MG PO TABS
80.0000 mg | ORAL_TABLET | Freq: Every day | ORAL | Status: DC
Start: 2014-01-03 — End: 2014-11-13

## 2014-01-03 NOTE — Telephone Encounter (Signed)
Follow Up        Pt returning phone call for lab results. Please call back and advise.

## 2014-01-03 NOTE — Telephone Encounter (Signed)
Pt agreed to start lipitor 80mg  daily, will return for fasting lipid/liver profile 03/06/14. Pt will return for repeat BMET 01/06/14

## 2014-01-06 ENCOUNTER — Other Ambulatory Visit: Payer: Self-pay

## 2014-01-09 ENCOUNTER — Encounter: Payer: Self-pay | Admitting: Physician Assistant

## 2014-01-09 NOTE — Progress Notes (Signed)
This encounter was created in error - please disregard.

## 2014-01-09 NOTE — Progress Notes (Deleted)
Cardiology Office Note   Date:  01/09/2014   ID:  Jade Bond, DOB Oct 24, 1965, MRN [id number]  PCP:  Dellia Nims, MD  Cardiologist:  Dr. Dorris Carnes    History of Present Illness: Jade Bond is a 48 y.o. female with a history of uncontrolled diabetes, HTN, HL, CKD stage 3 (baseline Cr 2.1), normocytic anemia in the setting of menorrhagia. She was seen initially on 11/29/13 for evaluation of LE edema. She has a history of normal LV function. She reports an unremarkable cardiac catheterization 5-6 years ago in [place].  Patient has recent albumin of 1.6. Patient's edema had improved on Lasix. 24 hour urine was scheduled (r/o nephrotic syndrome) and she was referred to nephrology. Patient noted chest pain and was set up for a Myoview. This demonstrated no ischemia but there was an inferior wall motion abnormality. Follow-up echocardiogram demonstrated vigorous LV function with mid cavity gradient during Valsalva and normal wall motion. Metoprolol was added to her medical regimen for control of blood pressure. Follow-up labs demonstrated significantly elevated cholesterol and she was placed on high-dose Lipitor. She returns for follow-up.     Studies:   - Echo (12/05/13):  Moderate LVH, EF 60-65% with dynamic obstruction during Valsalva in the mid cavity with mid cavity obliteration and peak velocity of 2 63 cm/s and peak gradient of 28 mmHg, normal wall motion, grade 1 diastolic dysfunction, MAC  - Nuclear (12/05/13):  No ischemia, EF 53%, mild inferior HK;  normal study   Recent Labs: 02/21/2013: LDL (calc) 187* 11/25/2013: Hemoglobin 11.6* 11/29/2013: ALT 9; BUN 26*; Creatinine 2.1*; Direct LDL 236.6; Potassium 4.0; Pro B Natriuretic peptide (BNP) 50.0; Sodium 128*; TSH 0.56  11/29/2013: Albumin 2.1*   Recent Radiology: Dg Chest 2 View  12/23/2013    IMPRESSION: No acute cardiopulmonary abnormality seen.   Electronically Signed   By: Sabino Dick M.D.   On: 12/23/2013 14:00       Wt Readings from Last 3 Encounters:  12/23/13 196 lb (88.905 kg)  12/05/13 191 lb (86.637 kg)  11/29/13 194 lb 6.4 oz (88.179 kg)     Past Medical History  Diagnosis Date  . Hypertension   . Hyperlipidemia   . Diabetes mellitus   . Pneumonia 07/04/2013  . CKD (chronic kidney disease), stage III   . Normocytic anemia   . Chest pain     a. 2D echo 01/2010 - mild TR, LVEF 60% with normal wall thickness, trace MR, mild TR, normal RV, trace pericardial effusion. b. Normal nuc 03/2011 at [facility].  . Hypoalbuminemia   . CHF (congestive heart failure)     Current Outpatient Prescriptions  Medication Sig Dispense Refill  . aspirin 81 MG tablet Take 81 mg by mouth daily.    Marland Kitchen atorvastatin (LIPITOR) 80 MG tablet Take 1 tablet (80 mg total) by mouth daily. 30 tablet 3  . furosemide (LASIX) 20 MG tablet Take 1 tablet (20 mg total) by mouth daily. 30 tablet 0  . guaiFENesin-codeine 100-10 MG/5ML syrup Take 5 mLs by mouth 3 (three) times daily as needed for cough. 120 mL 0  . insulin aspart (NOVOLOG) 100 UNIT/ML injection Sliding scale as directed. Pt takes anywhere from 4-10 units when blood sugar is >150. diag code 250.62. Insulin dependent 20 mL 6  . insulin glargine (LANTUS) 100 UNIT/ML injection Inject 0.2 mLs (20 Units total) into the skin at bedtime. 20 mL 6  . metoprolol succinate (TOPROL-XL) 50 MG 24 hr tablet Take 1 tablet (50  mg total) by mouth daily. Take with or immediately following a meal. 30 tablet 6  . nitroGLYCERIN (NITROSTAT) 0.4 MG SL tablet Place 0.4 mg under the tongue every 5 (five) minutes as needed. For chest pain     No current facility-administered medications for this visit.     Allergies:   Review of patient's allergies indicates no known allergies.   Social History:  The patient  reports that she has never smoked. She has never used smokeless tobacco. She reports that she drinks alcohol. She reports that she does not use illicit drugs.   Family History:  The  patient's family history includes Diabetes Mellitus II in her mother and sister; Heart attack in her father; Hypertension in her sister; Kidney disease in her father; Malignant hyperthermia in her mother.    ROS:  Please see the history of present illness.   ***   All other systems reviewed and negative.    PHYSICAL EXAM: VS:  LMP 12/05/2013 Well nourished, well developed, in no acute distress HEENT: normal Neck: *** JVD Cardiac:  normal S1, S2; ***RRR; *** murmur *** Lungs:  ***clear to auscultation bilaterally, no wheezing, rhonchi or rales Abd: soft, nontender, no hepatomegaly Ext: *** edema Skin: warm and dry Neuro:  CNs 2-12 intact, no focal abnormalities noted  EKG:  ***      ASSESSMENT AND PLAN:  No diagnosis found.  1.  Edema:  *** 2.  Hypoalbuminemia:  *** 3.  Hypertension:  *** 4.  Hyperlipidemia:  *** 5.  Chest Pain:  *** 6.  Chronic Kidney Disease:  *** 7.  Diabetes Mellitus with Poor Control:  ***  Disposition:   FU with ***   Signed, Richardson Dopp, PA-C, MHS 01/09/2014 8:23 AM    [facility] [place], [place],   [address] Phone: [phone]; Fax: [phone]

## 2014-01-17 ENCOUNTER — Other Ambulatory Visit: Payer: Self-pay | Admitting: *Deleted

## 2014-01-17 DIAGNOSIS — N189 Chronic kidney disease, unspecified: Secondary | ICD-10-CM

## 2014-02-06 ENCOUNTER — Ambulatory Visit: Payer: Self-pay | Admitting: Internal Medicine

## 2014-02-07 ENCOUNTER — Ambulatory Visit (INDEPENDENT_AMBULATORY_CARE_PROVIDER_SITE_OTHER): Payer: Self-pay | Admitting: Internal Medicine

## 2014-02-07 ENCOUNTER — Encounter: Payer: Self-pay | Admitting: Internal Medicine

## 2014-02-07 VITALS — BP 132/88 | HR 80 | Temp 97.9°F | Resp 12 | Ht 66.0 in | Wt 193.0 lb

## 2014-02-07 DIAGNOSIS — E1165 Type 2 diabetes mellitus with hyperglycemia: Secondary | ICD-10-CM

## 2014-02-07 DIAGNOSIS — IMO0002 Reserved for concepts with insufficient information to code with codable children: Secondary | ICD-10-CM

## 2014-02-07 LAB — HEMOGLOBIN A1C: Hgb A1c MFr Bld: 13.3 % — ABNORMAL HIGH (ref 4.6–6.5)

## 2014-02-07 MED ORDER — GLIPIZIDE 10 MG PO TABS: 10.0000 mg | ORAL_TABLET | Freq: Two times a day (BID) | ORAL | Status: DC

## 2014-02-07 MED ORDER — INSULIN GLARGINE 100 UNIT/ML SOLOSTAR PEN: 25.0000 [IU] | PEN_INJECTOR | Freq: Every day | SUBCUTANEOUS | Status: DC

## 2014-02-07 MED ORDER — INSULIN PEN NEEDLE 32G X 4 MM MISC: Status: DC

## 2014-02-07 NOTE — Patient Instructions (Signed)
Please start Lantus 20 units at bedtime. In 3 days, if sugars in am not improved to <150, increase to 25 units at bedtime. Please start Glipizide 10 mg 2x a day, before b'fast and before dinner.  Please let me know what meter and strips we need to send to your pharmacy.  Please send me a message with your sugars in 2 weeks.  Please stop at the lab.  Please return in 1 month with your sugar log.   PATIENT INSTRUCTIONS FOR TYPE 2 DIABETES:  **Please join MyChart!** - see attached instructions about how to join if you have not done so already.  DIET AND EXERCISE Diet and exercise is an important part of diabetic treatment.  We recommended aerobic exercise in the form of brisk walking (working between 40-60% of maximal aerobic capacity, similar to brisk walking) for 150 minutes per week (such as 30 minutes five days per week) along with 3 times per week performing 'resistance' training (using various gauge rubber tubes with handles) 5-10 exercises involving the major muscle groups (upper body, lower body and core) performing 10-15 repetitions (or near fatigue) each exercise. Start at half the above goal but build slowly to reach the above goals. If limited by weight, joint pain, or disability, we recommend daily walking in a swimming pool with water up to waist to reduce pressure from joints while allow for adequate exercise.    BLOOD GLUCOSES Monitoring your blood glucoses is important for continued management of your diabetes. Please check your blood glucoses 2-4 times a day: fasting, before meals and at bedtime (you can rotate these measurements - e.g. one day check before the 3 meals, the next day check before 2 of the meals and before bedtime, etc.).   HYPOGLYCEMIA (low blood sugar) Hypoglycemia is usually a reaction to not eating, exercising, or taking too much insulin/ other diabetes drugs.  Symptoms include tremors, sweating, hunger, confusion, headache, etc. Treat IMMEDIATELY with 15  grams of Carbs: . 4 glucose tablets .  cup regular juice/soda . 2 tablespoons raisins . 4 teaspoons sugar . 1 tablespoon honey Recheck blood glucose in 15 mins and repeat above if still symptomatic/blood glucose <100.  RECOMMENDATIONS TO REDUCE YOUR RISK OF DIABETIC COMPLICATIONS: * Take your prescribed MEDICATION(S) * Follow a DIABETIC diet: Complex carbs, fiber rich foods, (monounsaturated and polyunsaturated) fats * AVOID saturated/trans fats, high fat foods, >2,300 mg salt per day. * EXERCISE at least 5 times a week for 30 minutes or preferably daily.  * DO NOT SMOKE OR DRINK more than 1 drink a day. * Check your FEET every day. Do not wear tightfitting shoes. Contact us if you develop an ulcer * See your EYE doctor once a year or more if needed * Get a FLU shot once a year * Get a PNEUMONIA vaccine once before and once after age 96 years  GOALS:  * Your Hemoglobin A1c of <7%  * fasting sugars need to be <130 * after meals sugars need to be <180 (2h after you start eating) * Your Systolic BP should be 967 or lower  * Your Diastolic BP should be 80 or lower  * Your HDL (Good Cholesterol) should be 40 or higher  * Your LDL (Bad Cholesterol) should be 100 or lower. * Your Triglycerides should be 150 or lower  * Your Urine microalbumin (kidney function) should be <30 * Your Body Mass Index should be 25 or lower    Please consider the following ways to cut  down carbs and fat and increase fiber and micronutrients in your diet: - substitute whole grain for white bread or pasta - substitute brown rice for white rice - substitute 90-calorie flat bread pieces for slices of bread when possible - substitute sweet potatoes or yams for white potatoes - substitute humus for margarine - substitute tofu for cheese when possible - substitute almond or rice milk for regular milk (would not drink soy milk daily due to concern for soy estrogen influence on breast cancer risk) - substitute  dark chocolate for other sweets when possible - substitute water - can add lemon or orange slices for taste - for diet sodas (artificial sweeteners will trick your body that you can eat sweets without getting calories and will lead you to overeating and weight gain in the long run) - do not skip breakfast or other meals (this will slow down the metabolism and will result in more weight gain over time)  - can try smoothies made from fruit and almond/rice milk in am instead of regular breakfast - can also try old-fashioned (not instant) oatmeal made with almond/rice milk in am - order the dressing on the side when eating salad at a restaurant (pour less than half of the dressing on the salad) - eat as little meat as possible - can try juicing, but should not forget that juicing will get rid of the fiber, so would alternate with eating raw veg./fruits or drinking smoothies - use as little oil as possible, even when using olive oil - can dress a salad with a mix of balsamic vinegar and lemon juice, for e.g. - use agave nectar, stevia sugar, or regular sugar rather than artificial sweateners - steam or broil/roast veggies  - snack on veggies/fruit/nuts (unsalted, preferably) when possible, rather than processed foods - reduce or eliminate aspartame in diet (it is in diet sodas, chewing gum, etc) Read the labels!  Try to read Dr. Janene Harvey book: "Program for Reversing Diabetes" for other ideas for healthy eating.

## 2014-02-07 NOTE — Progress Notes (Signed)
Patient ID: Jade Bond, female   DOB: 1965-10-09, 49 y.o.   MRN: [id number]  HPI: Jade Bond is a 49 y.o.-year-old female, referred by her cardiologist, Dr. Harrington Challenger, for management of DM2, dx 1986 (initially GDM), insulin-dependent, uncontrolled, with complications (CKD stage 3, peripheral neuropathy, DR).  Last hemoglobin A1c was: Lab Results  Component Value Date   HGBA1C 8.9 08/07/2013   HGBA1C >14.0 05/15/2013   HGBA1C >14.0 02/21/2013  She did not have insurance >> was off meds since 08/2013 to 12/2013 >> started to work in 10/2013 >> now started on a new insurance 01/31/2014 [facility]).  Pt was on a regimen of (>> now off all meds for 2 mo): - Lantus 20 units in am and 25 units at night - Novolog 4-10 units tid ac - started at 150 - usually 5 units a meal She was on metformin.  Pt used to check her sugars 3x a day - not since stopping the meds! - am: 100-110 - 2h after b'fast: n/c - before lunch: 130-140 - 2h after lunch: n/c - before dinner: n/c - 2h after dinner: n/c - bedtime: 160-170 No lows; she has hypoglycemia awareness at 60.  Highest sugar was 440.  Glucometer: OneTouch  Pt's meals are: - Breakfast: grits, oatmeal or pancakes + coffee - Lunch: wrap - Dinner: chicken, veggies, starch (but now gets out of school at 10 pm >> gets home after 12 am >> does not eat dinner) - Snacks: 3 or more  - She has CKD, last BUN/creatinine:  Lab Results  Component Value Date   BUN 26* 11/29/2013   CREATININE 2.1* 11/29/2013  She will be seen by nephrology.  - last set of lipids: Lab Results  Component Value Date   CHOL 385* 11/29/2013   HDL 37.60* 11/29/2013   LDLCALC 187* 02/21/2013   LDLDIRECT 236.6 11/29/2013   TRIG 325.0* 11/29/2013   CHOLHDL 10 11/29/2013  She is on Lipitor 80. On ASA 81. - last eye exam was in fall 2015. + DR.  - no numbness and tingling in her feet. Previous PN sxs.  Pt has FH of DM in mother, sisters.  ROS: Constitutional: no weight  gain/loss, no fatigue, no subjective hyperthermia/hypothermia Eyes: no blurry vision, no xerophthalmia ENT: no sore throat, no nodules palpated in throat, no dysphagia/odynophagia, no hoarseness Cardiovascular: + CP/no SOB/palpitations/leg swelling Respiratory: no cough/SOB Gastrointestinal: no N/V/D/C Musculoskeletal: no muscle/joint aches Skin: no rashes Neurological: no tremors/numbness/tingling/dizziness Psychiatric: no depression/anxiety + low libido  Past Medical History  Diagnosis Date  . Hypertension   . Hyperlipidemia   . Diabetes mellitus   . Pneumonia 07/04/2013  . CKD (chronic kidney disease), stage III   . Normocytic anemia   . Chest pain     a. 2D echo 01/2010 - mild TR, LVEF 60% with normal wall thickness, trace MR, mild TR, normal RV, trace pericardial effusion. b. Normal nuc 03/2011 at [facility].  . Hypoalbuminemia   . CHF (congestive heart failure)    Past Surgical History  Procedure Laterality Date  . Tonsillectomy  1980  . Cardiac catheterization  ~ 2008    negative, no intervention needed  . Tubal ligation  1995   History   Social History  . Marital Status: Legally Separated    Spouse Name: N/A    Number of Children: 0   Occupational History  .      Food Therapist, nutritional   Social History Main Topics  . Smoking status: Never Smoker   .  Smokeless tobacco: Never Used  . Alcohol Use: Yes     Comment: occasional drink of wine - 1 glass/week or less  . Drug Use: No   Current Outpatient Prescriptions on File Prior to Visit  Medication Sig Dispense Refill  . aspirin 81 MG tablet Take 81 mg by mouth daily.    Marland Kitchen atorvastatin (LIPITOR) 80 MG tablet Take 1 tablet (80 mg total) by mouth daily. 30 tablet 3  . furosemide (LASIX) 20 MG tablet Take 1 tablet (20 mg total) by mouth daily. 30 tablet 0  . guaiFENesin-codeine 100-10 MG/5ML syrup Take 5 mLs by mouth 3 (three) times daily as needed for cough. 120 mL 0  . insulin aspart (NOVOLOG) 100 UNIT/ML injection  Sliding scale as directed. Pt takes anywhere from 4-10 units when blood sugar is >150. diag code 250.62. Insulin dependent 20 mL 6  . insulin glargine (LANTUS) 100 UNIT/ML injection Inject 0.2 mLs (20 Units total) into the skin at bedtime. 20 mL 6  . metoprolol succinate (TOPROL-XL) 50 MG 24 hr tablet Take 1 tablet (50 mg total) by mouth daily. Take with or immediately following a meal. 30 tablet 6  . nitroGLYCERIN (NITROSTAT) 0.4 MG SL tablet Place 0.4 mg under the tongue every 5 (five) minutes as needed. For chest pain     No current facility-administered medications on file prior to visit.   No Known Allergies Family History  Problem Relation Age of Onset  . Malignant hyperthermia Mother   . Heart attack Father     Age 55  . Diabetes Mellitus II Mother   . Kidney disease Father   . Diabetes Mellitus II Sister   . Hypertension Sister    PE: BP 132/88 mmHg  Pulse 80  Temp(Src) 97.9 F (36.6 C) (Oral)  Resp 12  Ht 5\' 6"  (1.676 m)  Wt 193 lb (87.544 kg)  BMI 31.17 kg/m2  SpO2 98% Wt Readings from Last 3 Encounters:  02/07/14 193 lb (87.544 kg)  12/23/13 196 lb (88.905 kg)  12/05/13 191 lb (86.637 kg)   Constitutional: overweight, in NAD Eyes: PERRLA, EOMI, no exophthalmos ENT: moist mucous membranes, no thyromegaly, no cervical lymphadenopathy Cardiovascular: RRR, No MRG Respiratory: CTA B Gastrointestinal: abdomen soft, NT, ND, BS+ Musculoskeletal: no deformities, strength intact in all 4 Skin: moist, warm, no rashes Neurological: no tremor with outstretched hands, DTR normal in all 4  ASSESSMENT: 1. DM2, insulin-dependent, uncontrolled, with complications - CKD stage 3 - PN  PLAN:  1. Patient with long-standing, uncontrolled diabetes, off her basal-bolus insulin regimen for last 2 mo as she could not afford her meds when she did not have insurance. She is not checking sugars now, but per check in ED >> 400s. We will restart her Lantus and will try Glipizide bid to  see if we can do without mealtime insulin. She will call me in 2 weeks with her sugars. We do not have many options of tx 2/2 her high Creatinine (CKD) but we may add Tradjenta.  - We discussed about options for treatment, and I suggested to:  Patient Instructions  Please start Lantus 20 units at bedtime. In 3 days, if sugars in am not improved to <150, increase to 25 units at bedtime. Please start Glipizide 10 mg 2x a day, before b'fast and before dinner.  Please let me know what meter and strips we need to send to your pharmacy.  Please send me a message with your sugars in 2 weeks.  Please stop  at the lab.  Please return in 1 month with your sugar log.   - Strongly advised her to start checking sugars at different times of the day - check 3 times a day, rotating checks - given sugar log and advised how to fill it and to bring it at next appt  - given foot care handout and explained the principles  - given instructions for hypoglycemia management "15-15 rule"  - advised for yearly eye exams - check HbA1c now - Return to clinic in 1 mo with sugar log   Office Visit on 02/07/2014  Component Date Value Ref Range Status  . Hgb A1c MFr Bld 02/07/2014 13.3* 4.6 - 6.5 % Final   Glycemic Control Guidelines for People with Diabetes:Non Diabetic:  <6%Goal of Therapy: <7%Additional Action Suggested:  >8%    The HbA1c is high, as expected.

## 2014-03-06 ENCOUNTER — Other Ambulatory Visit: Payer: Self-pay

## 2014-04-10 ENCOUNTER — Encounter: Payer: Self-pay | Admitting: *Deleted

## 2014-05-27 ENCOUNTER — Emergency Department (HOSPITAL_COMMUNITY)
Admission: EM | Admit: 2014-05-27 | Discharge: 2014-05-27 | Disposition: A | Discharge status: Home / Self Care | Payer: PRIVATE HEALTH INSURANCE | Attending: Emergency Medicine | Admitting: Emergency Medicine

## 2014-05-27 ENCOUNTER — Encounter (HOSPITAL_COMMUNITY): Payer: Self-pay | Admitting: *Deleted

## 2014-05-27 DIAGNOSIS — Z794 Long term (current) use of insulin: Secondary | ICD-10-CM | POA: Insufficient documentation

## 2014-05-27 DIAGNOSIS — Z8701 Personal history of pneumonia (recurrent): Secondary | ICD-10-CM | POA: Diagnosis not present

## 2014-05-27 DIAGNOSIS — H109 Unspecified conjunctivitis: Secondary | ICD-10-CM | POA: Insufficient documentation

## 2014-05-27 DIAGNOSIS — I509 Heart failure, unspecified: Secondary | ICD-10-CM | POA: Diagnosis not present

## 2014-05-27 DIAGNOSIS — N183 Chronic kidney disease, stage 3 (moderate): Secondary | ICD-10-CM | POA: Diagnosis not present

## 2014-05-27 DIAGNOSIS — I129 Hypertensive chronic kidney disease with stage 1 through stage 4 chronic kidney disease, or unspecified chronic kidney disease: Secondary | ICD-10-CM | POA: Insufficient documentation

## 2014-05-27 DIAGNOSIS — E119 Type 2 diabetes mellitus without complications: Secondary | ICD-10-CM | POA: Insufficient documentation

## 2014-05-27 DIAGNOSIS — Z79899 Other long term (current) drug therapy: Secondary | ICD-10-CM | POA: Diagnosis not present

## 2014-05-27 DIAGNOSIS — Z7982 Long term (current) use of aspirin: Secondary | ICD-10-CM | POA: Diagnosis not present

## 2014-05-27 DIAGNOSIS — E785 Hyperlipidemia, unspecified: Secondary | ICD-10-CM | POA: Insufficient documentation

## 2014-05-27 DIAGNOSIS — H578 Other specified disorders of eye and adnexa: Secondary | ICD-10-CM | POA: Diagnosis present

## 2014-05-27 DIAGNOSIS — Z862 Personal history of diseases of the blood and blood-forming organs and certain disorders involving the immune mechanism: Secondary | ICD-10-CM | POA: Insufficient documentation

## 2014-05-27 MED ORDER — TOBRAMYCIN 0.3 % OP SOLN
2.0000 [drp] | Freq: Once | OPHTHALMIC | Status: AC
  Administered 2014-05-27: 2 [drp] via OPHTHALMIC
  Filled 2014-05-27: qty 5

## 2014-05-27 NOTE — ED Notes (Signed)
Pt in c/o right eye redness and swelling, drainage noted in the morning, no distress noted, denies fever

## 2014-05-27 NOTE — ED Provider Notes (Signed)
CSN: [id number]     Arrival date & time 05/27/14  2100 History  This chart was scribed for Jade Lemming, PA-C working with Orlie Dakin, MD by Jade Bond, ED Scribe. This patient was seen in room TR05C/TR05C and the patient's care was started at 9:43 PM.   Chief Complaint  Patient presents with  . Eye Problem   The history is provided by the patient. No language interpreter was used.   HPI Comments: Jade Bond is a 49 y.o. female who presents to the Emergency Department complaining of foreign body sensation and itching to her right eye while traveling yesterday. Patient reports later development of swelling, mucus like and watery discharge. Patient reports waking to her eye sealed with crusting discharge this morning. Patient denies associated cold symptoms. Patient does not wear contact lenses. Patient denies known sick contacts with similar symptoms. No objects flew into her eye. She denies change in her vision.   Past Medical History  Diagnosis Date  . Hypertension   . Hyperlipidemia   . Diabetes mellitus   . Pneumonia 07/04/2013  . CKD (chronic kidney disease), stage III   . Normocytic anemia   . Chest pain     a. 2D echo 01/2010 - mild TR, LVEF 60% with normal wall thickness, trace MR, mild TR, normal RV, trace pericardial effusion. b. Normal nuc 03/2011 at [facility].  . Hypoalbuminemia   . CHF (congestive heart failure)    Past Surgical History  Procedure Laterality Date  . Tonsillectomy  1980  . Cardiac catheterization  ~ 2008    negative, no intervention needed  . Tubal ligation  1995   Family History  Problem Relation Age of Onset  . Malignant hyperthermia Mother   . Heart attack Father     Age 25  . Diabetes Mellitus II Mother   . Kidney disease Father   . Diabetes Mellitus II Sister   . Hypertension Sister    History  Substance Use Topics  . Smoking status: Never Smoker   . Smokeless tobacco: Never Used  . Alcohol Use: Yes     Comment: occasional drink of wine -  1 glass/week or less   OB History    No data available     Review of Systems  Constitutional: Negative for fever.  HENT: Negative for rhinorrhea.   Eyes: Positive for discharge, redness and itching. Negative for photophobia, pain and visual disturbance.  Respiratory: Negative for cough.       Allergies  Review of patient's allergies indicates no known allergies.  Home Medications   Prior to Admission medications   Medication Sig Start Date End Date Taking? Authorizing Provider  aspirin 81 MG tablet Take 81 mg by mouth daily.    Historical Provider, MD  atorvastatin (LIPITOR) 80 MG tablet Take 1 tablet (80 mg total) by mouth daily. 01/03/14   Fay Records, MD  furosemide (LASIX) 20 MG tablet Take 1 tablet (20 mg total) by mouth daily. 11/25/13   Kaitlyn Szekalski, PA-C  glipiZIDE (GLUCOTROL) 10 MG tablet Take 1 tablet (10 mg total) by mouth 2 (two) times daily before a meal. 02/07/14   Philemon Kingdom, MD  guaiFENesin-codeine 100-10 MG/5ML syrup Take 5 mLs by mouth 3 (three) times daily as needed for cough. Patient not taking: Reported on 02/07/2014 12/23/13   Comer Locket, PA-C  insulin aspart (NOVOLOG) 100 UNIT/ML injection Sliding scale as directed. Pt takes anywhere from 4-10 units when blood sugar is >150. diag code 250.62. Insulin dependent  Patient not taking: Reported on 02/07/2014 06/27/13   Pollie Friar, MD  Insulin Glargine (LANTUS SOLOSTAR) 100 UNIT/ML Solostar Pen Inject 25 Units into the skin daily at 10 pm. 02/07/14   Philemon Kingdom, MD  Insulin Pen Needle (BD PEN NEEDLE NANO U/F) 32G X 4 MM MISC Use 1x a day 02/07/14   Philemon Kingdom, MD  metoprolol succinate (TOPROL-XL) 50 MG 24 hr tablet Take 1 tablet (50 mg total) by mouth daily. Take with or immediately following a meal. 11/29/13   Fay Records, MD  nitroGLYCERIN (NITROSTAT) 0.4 MG SL tablet Place 0.4 mg under the tongue every 5 (five) minutes as needed. For chest pain    Historical Provider, MD   Triage Vitals: BP  155/97 mmHg  Pulse 98  Temp(Src) 98.2 F (36.8 C) (Oral)  Resp 16  SpO2 98%  Physical Exam  Constitutional: She is oriented to person, place, and time. She appears well-developed and well-nourished. No distress.  HENT:  Head: Normocephalic and atraumatic.  Right Ear: Tympanic membrane, external ear and ear canal normal.  Left Ear: Tympanic membrane, external ear and ear canal normal.  Nose: Nose normal. No mucosal edema or rhinorrhea.  Mouth/Throat: Oropharynx is clear and moist and mucous membranes are normal. Mucous membranes are not dry. No oropharyngeal exudate or posterior oropharyngeal edema.  Eyes: EOM are normal. Pupils are equal, round, and reactive to light. Right eye exhibits discharge. Right eye exhibits no chemosis and no hordeolum. No foreign body present in the right eye. Left eye exhibits no chemosis, no discharge and no hordeolum. No foreign body present in the left eye. Right conjunctiva is injected. Right conjunctiva has no hemorrhage. Left conjunctiva is not injected. Left conjunctiva has no hemorrhage. Right eye exhibits normal extraocular motion. Left eye exhibits normal extraocular motion.  Neck: Normal range of motion. Neck supple. No tracheal deviation present.  Cardiovascular: Normal rate.   Pulmonary/Chest: Effort normal. No respiratory distress.  Musculoskeletal: Normal range of motion.  Neurological: She is alert and oriented to person, place, and time.  Skin: Skin is warm and dry.  Psychiatric: She has a normal mood and affect. Her behavior is normal.  Nursing note and vitals reviewed.   ED Course  Procedures (including critical care time)  COORDINATION OF CARE: 9:50 PM- Discussed treatment plan with patient at bedside and patient agreed to plan.   Labs Review Labs Reviewed - No data to display  Imaging Review No results found.   EKG Interpretation None       Vital signs reviewed and are as follows: Filed Vitals:   05/27/14 2210  BP: 150/95   Pulse: 89  Temp: 98.6 F (37 C)  Resp: 18   Patient treated with tobrex drops 2/2 DM. Patient encouraged to follow-up with her eye doctor in 2 days if not feeling better. Patient to return in emergency department sooner with redness or swelling around her eye, development of eye pain,, vision change or other problems.  MDM   Final diagnoses:  Conjunctivitis of right eye   Symptoms consistent with conjunctivitis. No foreign bodies noted. No surrounding erythema, swelling, vision changes/loss suspicious for orbital or periorbital cellulitis. No signs of iritis. No signs of glaucoma. No symptoms of retinal detachment. No ophthalmologic emergency suspected. Outpatient referral given in case of no improvement.    I personally performed the services described in this documentation, which was scribed in my presence. The recorded information has been reviewed and is accurate.    Carlisle Cater,  PA-C 05/27/14 [place], MD 05/28/14 2902

## 2014-05-27 NOTE — Discharge Instructions (Signed)
Please read and follow all provided instructions.  Your diagnoses today include:  1. Conjunctivitis of right eye     Tests performed today include:  Vital signs. See below for your results today.   Medications prescribed:   Tobrex (tobramycin) - antibiotic eye drops or eye ointment  Use this medication as follows:  Use 1-2 drops in affected eye every 4 hours while awake for 5 days.  Take any prescribed medications only as directed.  Home care instructions:  Follow any educational materials contained in this packet. If you wear contact lenses, do not use them until your eye caregiver approves. Follow-up care is necessary to be sure the infection is healing if not completely resolved in 2-3 days. See your caregiver or eye specialist as suggested for followup.   If you have an eye infection, wash your hands often as this is very contagious and is easily spread from person to person.   Follow-up instructions: Please follow-up with your ye doctor in the next 2-3 days for further evaluation of your symptoms if they do not improve.  Return instructions:   Please return to the Emergency Department if you experience worsening symptoms.   Please return immediately if you develop severe pain, pus drainage, new change in vision, or fever.  Please return if you have any other emergent concerns.  Additional Information:  Your vital signs today were: BP 155/97 mmHg   Pulse 98   Temp(Src) 98.2 F (36.8 C) (Oral)   Resp 16   SpO2 98% If your blood pressure (BP) was elevated above 135/85 this visit, please have this repeated by your doctor within one month. ---------------

## 2014-05-27 NOTE — ED Notes (Signed)
Declined W/C at D/C and was escorted to lobby by RN. 

## 2014-06-13 ENCOUNTER — Ambulatory Visit: Payer: Self-pay | Admitting: Internal Medicine

## 2014-06-18 ENCOUNTER — Ambulatory Visit: Payer: Self-pay | Admitting: Internal Medicine

## 2014-06-18 ENCOUNTER — Telehealth: Payer: Self-pay | Admitting: Internal Medicine

## 2014-06-18 DIAGNOSIS — Z0289 Encounter for other administrative examinations: Secondary | ICD-10-CM

## 2014-06-18 NOTE — Telephone Encounter (Signed)
Per [facility]   - Your patient Jade Bond has been contacted 5 times by our office to schedule a New Patient appointment.   She has also No Show x 2.    This patient  don't have a schedule appointment.  If you still want the patient to be seen,  you will need to restart the referral process again.   Sharyn Lull can be reach at [phone] ext [phone]

## 2014-07-17 ENCOUNTER — Emergency Department (HOSPITAL_COMMUNITY): Payer: PRIVATE HEALTH INSURANCE

## 2014-07-17 ENCOUNTER — Emergency Department (HOSPITAL_COMMUNITY)
Admission: EM | Admit: 2014-07-17 | Discharge: 2014-07-18 | Disposition: A | Discharge status: Home / Self Care | Payer: PRIVATE HEALTH INSURANCE | Attending: Emergency Medicine | Admitting: Emergency Medicine

## 2014-07-17 ENCOUNTER — Encounter (HOSPITAL_COMMUNITY): Payer: Self-pay | Admitting: *Deleted

## 2014-07-17 DIAGNOSIS — Z9114 Patient's other noncompliance with medication regimen: Secondary | ICD-10-CM

## 2014-07-17 DIAGNOSIS — Z8701 Personal history of pneumonia (recurrent): Secondary | ICD-10-CM | POA: Insufficient documentation

## 2014-07-17 DIAGNOSIS — I129 Hypertensive chronic kidney disease with stage 1 through stage 4 chronic kidney disease, or unspecified chronic kidney disease: Secondary | ICD-10-CM | POA: Insufficient documentation

## 2014-07-17 DIAGNOSIS — Z598 Other problems related to housing and economic circumstances: Secondary | ICD-10-CM

## 2014-07-17 DIAGNOSIS — I509 Heart failure, unspecified: Secondary | ICD-10-CM | POA: Insufficient documentation

## 2014-07-17 DIAGNOSIS — Z794 Long term (current) use of insulin: Secondary | ICD-10-CM | POA: Insufficient documentation

## 2014-07-17 DIAGNOSIS — Z5989 Other problems related to housing and economic circumstances: Secondary | ICD-10-CM

## 2014-07-17 DIAGNOSIS — E1165 Type 2 diabetes mellitus with hyperglycemia: Secondary | ICD-10-CM | POA: Insufficient documentation

## 2014-07-17 DIAGNOSIS — R2243 Localized swelling, mass and lump, lower limb, bilateral: Secondary | ICD-10-CM | POA: Insufficient documentation

## 2014-07-17 DIAGNOSIS — R600 Localized edema: Secondary | ICD-10-CM

## 2014-07-17 DIAGNOSIS — Z9119 Patient's noncompliance with other medical treatment and regimen: Secondary | ICD-10-CM | POA: Insufficient documentation

## 2014-07-17 DIAGNOSIS — I10 Essential (primary) hypertension: Secondary | ICD-10-CM

## 2014-07-17 DIAGNOSIS — E785 Hyperlipidemia, unspecified: Secondary | ICD-10-CM | POA: Insufficient documentation

## 2014-07-17 DIAGNOSIS — N184 Chronic kidney disease, stage 4 (severe): Secondary | ICD-10-CM | POA: Insufficient documentation

## 2014-07-17 DIAGNOSIS — Z862 Personal history of diseases of the blood and blood-forming organs and certain disorders involving the immune mechanism: Secondary | ICD-10-CM | POA: Insufficient documentation

## 2014-07-17 LAB — CBG MONITORING, ED: GLUCOSE-CAPILLARY: 314 mg/dL — AB (ref 65–99)

## 2014-07-17 NOTE — ED Notes (Signed)
Pt c/o bilateral leg swelling ; pt states that her ankles were swollen on Sunday but nothing like the swelling today; pt with non-pitting edema from toes up both calves; pt c/o tingling to legs from the swelling; pt denies shortness of breath or any other sx

## 2014-07-18 LAB — BASIC METABOLIC PANEL
Anion gap: 7 (ref 5–15)
BUN: 26 mg/dL — ABNORMAL HIGH (ref 6–20)
CO2: 22 mmol/L (ref 22–32)
Calcium: 8.1 mg/dL — ABNORMAL LOW (ref 8.9–10.3)
Chloride: 105 mmol/L (ref 101–111)
Creatinine, Ser: 2.93 mg/dL — ABNORMAL HIGH (ref 0.44–1.00)
GFR calc Af Amer: 21 mL/min — ABNORMAL LOW (ref >60)
GFR calc non Af Amer: 18 mL/min — ABNORMAL LOW (ref >60)
GLUCOSE: 320 mg/dL — AB (ref 65–99)
Potassium: 4.4 mmol/L (ref 3.5–5.1)
Sodium: 134 mmol/L — ABNORMAL LOW (ref 135–145)

## 2014-07-18 LAB — CBC
HEMATOCRIT: 31.1 % — AB (ref 36.0–46.0)
HEMOGLOBIN: 10.2 g/dL — AB (ref 12.0–15.0)
MCH: 29.5 pg (ref 26.0–34.0)
MCHC: 32.8 g/dL (ref 30.0–36.0)
MCV: 89.9 fL (ref 78.0–100.0)
Platelets: 315 10*3/uL (ref 150–400)
RBC: 3.46 MIL/uL — ABNORMAL LOW (ref 3.87–5.11)
RDW: 12.6 % (ref 11.5–15.5)
WBC: 7 10*3/uL (ref 4.0–10.5)

## 2014-07-18 LAB — I-STAT TROPONIN, ED: TROPONIN I, POC: 0 ng/mL (ref 0.00–0.08)

## 2014-07-18 LAB — BRAIN NATRIURETIC PEPTIDE: B Natriuretic Peptide: 94 pg/mL (ref 0.0–100.0)

## 2014-07-18 MED ORDER — INSULIN PEN NEEDLE 32G X 4 MM MISC: Status: DC

## 2014-07-18 MED ORDER — INSULIN GLARGINE 100 UNIT/ML SOLOSTAR PEN: 25.0000 [IU] | PEN_INJECTOR | Freq: Every day | SUBCUTANEOUS | Status: DC

## 2014-07-18 MED ORDER — BLOOD GLUCOSE MONITOR KIT: PACK | Status: DC

## 2014-07-18 MED ORDER — INSULIN ASPART 100 UNIT/ML ~~LOC~~ SOLN
6.0000 [IU] | Freq: Once | SUBCUTANEOUS | Status: AC
  Administered 2014-07-18: 6 [IU] via SUBCUTANEOUS
  Filled 2014-07-18: qty 1

## 2014-07-18 MED ORDER — METOPROLOL TARTRATE 25 MG PO TABS
25.0000 mg | ORAL_TABLET | Freq: Once | ORAL | Status: AC
  Administered 2014-07-18: 25 mg via ORAL
  Filled 2014-07-18: qty 1

## 2014-07-18 MED ORDER — METOPROLOL SUCCINATE ER 50 MG PO TB24: 50.0000 mg | ORAL_TABLET | Freq: Every day | ORAL | Status: DC

## 2014-07-18 MED ORDER — INSULIN GLARGINE 100 UNIT/ML ~~LOC~~ SOLN
25.0000 [IU] | Freq: Once | SUBCUTANEOUS | Status: AC
  Administered 2014-07-18: 25 [IU] via SUBCUTANEOUS
  Filled 2014-07-18: qty 0.25

## 2014-07-18 MED ORDER — INSULIN ASPART 100 UNIT/ML ~~LOC~~ SOLN: SUBCUTANEOUS | Status: DC

## 2014-07-18 NOTE — ED Notes (Signed)
Pt from home states that yesterday she had excessive swelling in both lower extremities. She has c/o tingling and pain in the toes in both feet. Peripheral pulses are all present. Pt has a history of diabetes that is not controlled at home.

## 2014-07-18 NOTE — ED Provider Notes (Signed)
CSN: [id number]     Arrival date & time 07/17/14  2303 History   First MD Initiated Contact with Patient 07/18/14 0044     Chief Complaint  Patient presents with  . Leg Swelling     (Consider location/radiation/quality/duration/timing/severity/associated sxs/prior Treatment) HPI 49 year old female presents to the emergency department from home with complaint of swelling in her lower extremities, feet, ankles and lower legs.  It started yesterday.  Patient reports that she stands on her feet for long periods of time.  She reports that she has had swelling in her feet before, but never into her ankles.  She denies any chest pain, shortness of breath, abdominal distention.  Patient has history of hypertension, hyperlipidemia, diabetes, chronic kidney disease.  Patient has not been taking any of her medications, does not have a doctor or insurance. Past Medical History  Diagnosis Date  . Hypertension   . Hyperlipidemia   . Diabetes mellitus   . Pneumonia 07/04/2013  . CKD (chronic kidney disease), stage III   . Normocytic anemia   . Chest pain     a. 2D echo 01/2010 - mild TR, LVEF 60% with normal wall thickness, trace MR, mild TR, normal RV, trace pericardial effusion. b. Normal nuc 03/2011 at [facility].  . Hypoalbuminemia   . CHF (congestive heart failure)    Past Surgical History  Procedure Laterality Date  . Tonsillectomy  1980  . Cardiac catheterization  ~ 2008    negative, no intervention needed  . Tubal ligation  1995   Family History  Problem Relation Age of Onset  . Malignant hyperthermia Mother   . Heart attack Father     Age 21  . Diabetes Mellitus II Mother   . Kidney disease Father   . Diabetes Mellitus II Sister   . Hypertension Sister    History  Substance Use Topics  . Smoking status: Never Smoker   . Smokeless tobacco: Never Used  . Alcohol Use: Yes     Comment: occasional drink of wine - 1 glass/week or less   OB History    No data available     Review of  Systems  Cardiovascular: Positive for leg swelling.  All other systems reviewed and are negative.     Allergies  Review of patient's allergies indicates no known allergies.  Home Medications   Prior to Admission medications   Medication Sig Start Date End Date Taking? Authorizing Provider  acetaminophen (TYLENOL) 500 MG tablet Take 1,000 mg by mouth every 6 (six) hours as needed for mild pain.   Yes Historical Provider, MD  nitroGLYCERIN (NITROSTAT) 0.4 MG SL tablet Place 0.4 mg under the tongue every 5 (five) minutes as needed. For chest pain   Yes Historical Provider, MD  atorvastatin (LIPITOR) 80 MG tablet Take 1 tablet (80 mg total) by mouth daily. Patient not taking: Reported on 07/17/2014 01/03/14   Fay Records, MD  blood glucose meter kit and supplies KIT Dispense based on patient and insurance preference. Use up to four times daily as directed. (FOR ICD-9 250.00, 250.01). 07/18/14   Linton Flemings, MD  furosemide (LASIX) 20 MG tablet Take 1 tablet (20 mg total) by mouth daily. Patient not taking: Reported on 07/17/2014 11/25/13   Alvina Chou, PA-C  glipiZIDE (GLUCOTROL) 10 MG tablet Take 1 tablet (10 mg total) by mouth 2 (two) times daily before a meal. Patient not taking: Reported on 07/18/2014 02/07/14   Philemon Kingdom, MD  guaiFENesin-codeine 100-10 MG/5ML syrup Take 5  mLs by mouth 3 (three) times daily as needed for cough. Patient not taking: Reported on 02/07/2014 12/23/13   Comer Locket, PA-C  insulin aspart (NOVOLOG) 100 UNIT/ML injection Sliding scale as directed. Pt takes anywhere from 4-10 units when blood sugar is >150. diag code 250.62. Insulin dependent 07/18/14   Linton Flemings, MD  Insulin Glargine (LANTUS SOLOSTAR) 100 UNIT/ML Solostar Pen Inject 25 Units into the skin daily at 10 pm. 07/18/14   Linton Flemings, MD  Insulin Pen Needle (BD PEN NEEDLE NANO U/F) 32G X 4 MM MISC Use 1x a day 07/18/14   Linton Flemings, MD  metoprolol succinate (TOPROL-XL) 50 MG 24 hr tablet Take 1  tablet (50 mg total) by mouth daily. Take with or immediately following a meal. 07/18/14   Linton Flemings, MD   BP 189/86 mmHg  Pulse 86  Temp(Src) 98.1 F (36.7 C) (Oral)  Resp 18  SpO2 100%  LMP 07/08/2014 Physical Exam  Constitutional: She is oriented to person, place, and time. She appears well-developed and well-nourished.  HENT:  Head: Normocephalic and atraumatic.  Nose: Nose normal.  Mouth/Throat: Oropharynx is clear and moist.  Eyes: Conjunctivae and EOM are normal. Pupils are equal, round, and reactive to light.  Neck: Normal range of motion. Neck supple. No JVD present. No tracheal deviation present. No thyromegaly present.  Cardiovascular: Normal rate, regular rhythm, normal heart sounds and intact distal pulses.  Exam reveals no gallop and no friction rub.   No murmur heard. Pulmonary/Chest: Effort normal and breath sounds normal. No stridor. No respiratory distress. She has no wheezes. She has no rales. She exhibits no tenderness.  Abdominal: Soft. Bowel sounds are normal. She exhibits no distension and no mass. There is no tenderness. There is no rebound and no guarding.  Musculoskeletal: Normal range of motion. She exhibits edema (1+ nonpitting edema to feet and ankles). She exhibits no tenderness.  Lymphadenopathy:    She has no cervical adenopathy.  Neurological: She is alert and oriented to person, place, and time. She displays normal reflexes. She exhibits normal muscle tone. Coordination normal.  Skin: Skin is warm and dry. No rash noted. No erythema. No pallor.  Psychiatric: She has a normal mood and affect. Her behavior is normal. Judgment and thought content normal.  Nursing note and vitals reviewed.   ED Course  Procedures (including critical care time) Labs Review Labs Reviewed  CBC - Abnormal; Notable for the following:    RBC 3.46 (*)    Hemoglobin 10.2 (*)    HCT 31.1 (*)    All other components within normal limits  BASIC METABOLIC PANEL - Abnormal;  Notable for the following:    Sodium 134 (*)    Glucose, Bld 320 (*)    BUN 26 (*)    Creatinine, Ser 2.93 (*)    Calcium 8.1 (*)    GFR calc non Af Amer 18 (*)    GFR calc Af Amer 21 (*)    All other components within normal limits  CBG MONITORING, ED - Abnormal; Notable for the following:    Glucose-Capillary 314 (*)    All other components within normal limits  BRAIN NATRIURETIC PEPTIDE  I-STAT TROPOININ, ED    Imaging Review Dg Chest 2 View  07/18/2014   CLINICAL DATA:  Lower extremity swelling for 24 hours. Acute onset of shortness of breath. Initial encounter.  EXAM: CHEST  2 VIEW  COMPARISON:  Chest radiograph performed 12/23/2013  FINDINGS: The lungs are well-aerated and clear.  There is no evidence of focal opacification, pleural effusion or pneumothorax.  The heart is normal in size; the mediastinal contour is within normal limits. No acute osseous abnormalities are seen.  IMPRESSION: No acute cardiopulmonary process seen.   Electronically Signed   By: Garald Balding M.D.   On: 07/18/2014 00:02     EKG Interpretation   Date/Time:  Thursday July 17 2014 23:32:57 EDT Ventricular Rate:  86 PR Interval:  145 QRS Duration: 71 QT Interval:  379 QTC Calculation: 453 R Axis:   21 Text Interpretation:  Sinus rhythm Probable left atrial enlargement  Anteroseptal infarct, old Nonspecific T abnormalities, lateral leads  Confirmed by Keiasia Christianson  MD, Omunique Pederson ([id number]) on 07/18/2014 12:53:05 AM      MDM   Final diagnoses:  Bilateral lower extremity edema  CKD (chronic kidney disease) stage 4, GFR 15-29 ml/min  Type 2 diabetes mellitus with hyperglycemia  Essential hypertension  Noncompliance with medication regimen  Insurance coverage problems   49 year old female with multiple medical problems who presents with peripheral edema.  No signs of CHF or renal failure.  She has ongoing renal issues.  She does not take medications for her diabetes or hypertension due to insurance problems.   She does not have primary care doctor.  Patient given outpatient resources, will restart her medications.  Patient strongly encouraged to follow-up for further evaluation of her ongoing issues.  Linton Flemings, MD 07/19/14 [phone]

## 2014-07-18 NOTE — Discharge Instructions (Signed)
Wear compression stockings when you're going to be on your feet.  This will help with lower extremity edema.  You have multiple ongoing chronic medical issues that need close monitoring by a primary care doctor with a specialist intervention as well.  You'll need to be seen by a nephrologist in the near future.  Please contact the health and wellness center as listed above.  Restart your insulin and metoprolol.  Check your blood sugars regularly.  Return to the emergency department for chest pain, shortness of breath, weakness, dizziness or other new concerning symptoms.   Chronic Kidney Disease Chronic kidney disease occurs when the kidneys are damaged over a long period. The kidneys are two organs that lie on either side of the spine between the middle of the back and the front of the abdomen. The kidneys:   Remove wastes and extra water from the blood.   Produce important hormones. These help keep bones strong, regulate blood pressure, and help create red blood cells.   Balance the fluids and chemicals in the blood and tissues. A small amount of kidney damage may not cause problems, but a large amount of damage may make it difficult or impossible for the kidneys to work the way they should. If steps are not taken to slow down the kidney damage or stop it from getting worse, the kidneys may stop working permanently. Most of the time, chronic kidney disease does not go away. However, it can often be controlled, and those with the disease can usually live normal lives. CAUSES  The most common causes of chronic kidney disease are diabetes and high blood pressure (hypertension). Chronic kidney disease may also be caused by:   Diseases that cause the kidneys' filters to become inflamed.   Diseases that affect the immune system.   Genetic diseases.   Medicines that damage the kidneys, such as anti-inflammatory medicines.  Poisoning or exposure to toxic substances.   A reoccurring kidney  or urinary infection.   A problem with urine flow. This may be caused by:   Cancer.   Kidney stones.   An enlarged prostate in males. SIGNS AND SYMPTOMS  Because the kidney damage in chronic kidney disease occurs slowly, symptoms develop slowly and may not be obvious until the kidney damage becomes severe. A person may have a kidney disease for years without showing any symptoms. Symptoms can include:   Swelling (edema) of the legs, ankles, or feet.   Tiredness (lethargy).   Nausea or vomiting.   Confusion.   Problems with urination, such as:   Decreased urine production.   Frequent urination, especially at night.   Frequent accidents in children who are potty trained.   Muscle twitches and cramps.   Shortness of breath.  Weakness.   Persistent itchiness.   Loss of appetite.  Metallic taste in the mouth.  Trouble sleeping.  Slowed development in children.  Short stature in children. DIAGNOSIS  Chronic kidney disease may be detected and diagnosed by tests, including blood, urine, imaging, or kidney biopsy tests.  TREATMENT  Most chronic kidney diseases cannot be cured. Treatment usually involves relieving symptoms and preventing or slowing the progression of the disease. Treatment may include:   A special diet. You may need to avoid alcohol and foods thatare salty and high in potassium.   Medicines. These may:   Lower blood pressure.   Relieve anemia.   Relieve swelling.   Protect the bones. HOME CARE INSTRUCTIONS   Follow your prescribed diet.  Take medicines only as directed by your health care provider. Do not take any new medicines (prescription, over-the-counter, or nutritional supplements) unless approved by your health care provider. Many medicines can worsen your kidney damage or need to have the dose adjusted.   Quit smoking if you smoke. Talk to your health care provider about a smoking cessation program.   Keep  all follow-up visits as directed by your health care provider. SEEK IMMEDIATE MEDICAL CARE IF:  Your symptoms get worse or you develop new symptoms.   You develop symptoms of end-stage kidney disease. These include:   Headaches.   Abnormally dark or light skin.   Numbness in the hands or feet.   Easy bruising.   Frequent hiccups.   Menstruation stops.   You have a fever.   You have decreased urine production.   You havepain or bleeding when urinating. MAKE SURE YOU:  Understand these instructions.  Will watch your condition.  Will get help right away if you are not doing well or get worse. FOR MORE INFORMATION   American Association of Kidney Patients: BombTimer.gl  National Kidney Foundation: www.kidney.[place]: https://mathis.com/  [facility]: www.lifeoptions.org and www.kidneyschool.org Document Released: 10/27/2007 Document Revised: 06/03/2013 Document Reviewed: 09/16/2011 [facility] Patient Information 2015 [place], [place]. This information is not intended to replace advice given to you by your health care provider. Make sure you discuss any questions you have with your health care provider.  Blood Glucose Monitoring Monitoring your blood glucose (also know as blood sugar) helps you to manage your diabetes. It also helps you and your health care provider monitor your diabetes and determine how well your treatment plan is working. WHY SHOULD YOU MONITOR YOUR BLOOD GLUCOSE?  It can help you understand how food, exercise, and medicine affect your blood glucose.  It allows you to know what your blood glucose is at any given moment. You can quickly tell if you are having low blood glucose (hypoglycemia) or high blood glucose (hyperglycemia).  It can help you and your health care provider know how to adjust your medicines.  It can help you understand how to manage an illness or adjust medicine for exercise. WHEN  SHOULD YOU TEST? Your health care provider will help you decide how often you should check your blood glucose. This may depend on the type of diabetes you have, your diabetes control, or the types of medicines you are taking. Be sure to write down all of your blood glucose readings so that this information can be reviewed with your health care provider. See below for examples of testing times that your health care provider may suggest. Type 1 Diabetes  Test 4 times a day if you are in good control, using an insulin pump, or perform multiple daily injections.  If your diabetes is not well controlled or if you are sick, you may need to monitor more often.  It is a good idea to also monitor:  Before and after exercise.  Between meals and 2 hours after a meal.  Occasionally between 2:00 a.m. and 3:00 a.m. Type 2 Diabetes  It can vary with each person, but generally, if you are on insulin, test 4 times a day.  If you take medicines by mouth (orally), test 2 times a day.  If you are on a controlled diet, test once a day.  If your diabetes is not well controlled or if you are sick, you may need to monitor more often. HOW TO  MONITOR YOUR BLOOD GLUCOSE Supplies Needed  Blood glucose meter.  Test strips for your meter. Each meter has its own strips. You must use the strips that go with your own meter.  A pricking needle (lancet).  A device that holds the lancet (lancing device).  A journal or log book to write down your results. Procedure  Wash your hands with soap and water. Alcohol is not preferred.  Prick the side of your finger (not the tip) with the lancet.  Gently milk the finger until a small drop of blood appears.  Follow the instructions that come with your meter for inserting the test strip, applying blood to the strip, and using your blood glucose meter. Other Areas to Get Blood for Testing Some meters allow you to use other areas of your body (other than your finger)  to test your blood. These areas are called alternative sites. The most common alternative sites are:  The forearm.  The thigh.  The back area of the lower leg.  The palm of the hand. The blood flow in these areas is slower. Therefore, the blood glucose values you get may be delayed, and the numbers are different from what you would get from your fingers. Do not use alternative sites if you think you are having hypoglycemia. Your reading will not be accurate. Always use a finger if you are having hypoglycemia. Also, if you cannot feel your lows (hypoglycemia unawareness), always use your fingers for your blood glucose checks. ADDITIONAL TIPS FOR GLUCOSE MONITORING  Do not reuse lancets.  Always carry your supplies with you.  All blood glucose meters have a 24-hour "hotline" number to call if you have questions or need help.  Adjust (calibrate) your blood glucose meter with a control solution after finishing a few boxes of strips. BLOOD GLUCOSE RECORD KEEPING It is a good idea to keep a daily record or log of your blood glucose readings. Most glucose meters, if not all, keep your glucose records stored in the meter. Some meters come with the ability to download your records to your home computer. Keeping a record of your blood glucose readings is especially helpful if you are wanting to look for patterns. Make notes to go along with the blood glucose readings because you might forget what happened at that exact time. Keeping good records helps you and your health care provider to work together to achieve good diabetes management.  Document Released: 01/20/2003 Document Revised: 06/03/2013 Document Reviewed: 06/11/2012 [facility] Patient Information 2015 [place], [place]. This information is not intended to replace advice given to you by your health care provider. Make sure you discuss any questions you have with your health care provider.  DASH Eating Plan DASH stands for "Dietary Approaches to  Stop Hypertension." The DASH eating plan is a healthy eating plan that has been shown to reduce high blood pressure (hypertension). Additional health benefits may include reducing the risk of type 2 diabetes mellitus, heart disease, and stroke. The DASH eating plan may also help with weight loss. WHAT DO I NEED TO KNOW ABOUT THE DASH EATING PLAN? For the DASH eating plan, you will follow these general guidelines:  Choose foods with a percent daily value for sodium of less than 5% (as listed on the food label).  Use salt-free seasonings or herbs instead of table salt or sea salt.  Check with your health care provider or pharmacist before using salt substitutes.  Eat lower-sodium products, often labeled as "lower sodium" or "no salt  added."  Eat fresh foods.  Eat more vegetables, fruits, and low-fat dairy products.  Choose whole grains. Look for the word "whole" as the first word in the ingredient list.  Choose fish and skinless chicken or [place] more often than red meat. Limit fish, poultry, and meat to 6 oz (170 g) each day.  Limit sweets, desserts, sugars, and sugary drinks.  Choose heart-healthy fats.  Limit cheese to 1 oz (28 g) per day.  Eat more home-cooked food and less restaurant, buffet, and fast food.  Limit fried foods.  Cook foods using methods other than frying.  Limit canned vegetables. If you do use them, rinse them well to decrease the sodium.  When eating at a restaurant, ask that your food be prepared with less salt, or no salt if possible. WHAT FOODS CAN I EAT? Seek help from a dietitian for individual calorie needs. Grains Whole grain or whole wheat bread. Brown rice. Whole grain or whole wheat pasta. Quinoa, bulgur, and whole grain cereals. Low-sodium cereals. Corn or whole wheat flour tortillas. Whole grain cornbread. Whole grain crackers. Low-sodium crackers. Vegetables Fresh or frozen vegetables (raw, steamed, roasted, or grilled). Low-sodium or  reduced-sodium tomato and vegetable juices. Low-sodium or reduced-sodium tomato sauce and paste. Low-sodium or reduced-sodium canned vegetables.  Fruits All fresh, canned (in natural juice), or frozen fruits. Meat and Other Protein Products Ground beef (85% or leaner), grass-fed beef, or beef trimmed of fat. Skinless chicken or [place]. Ground chicken or [place]. Pork trimmed of fat. All fish and seafood. Eggs. Dried beans, peas, or lentils. Unsalted nuts and seeds. Unsalted canned beans. Dairy Low-fat dairy products, such as skim or 1% milk, 2% or reduced-fat cheeses, low-fat ricotta or cottage cheese, or plain low-fat yogurt. Low-sodium or reduced-sodium cheeses. Fats and Oils Tub margarines without trans fats. Light or reduced-fat mayonnaise and salad dressings (reduced sodium). Avocado. Safflower, olive, or canola oils. Natural peanut or almond butter. Other Unsalted popcorn and pretzels. The items listed above may not be a complete list of recommended foods or beverages. Contact your dietitian for more options. WHAT FOODS ARE NOT RECOMMENDED? Grains White bread. White pasta. White rice. Refined cornbread. Bagels and croissants. Crackers that contain trans fat. Vegetables Creamed or fried vegetables. Vegetables in a cheese sauce. Regular canned vegetables. Regular canned tomato sauce and paste. Regular tomato and vegetable juices. Fruits Dried fruits. Canned fruit in light or heavy syrup. Fruit juice. Meat and Other Protein Products Fatty cuts of meat. Ribs, chicken wings, bacon, sausage, bologna, salami, chitterlings, fatback, hot dogs, bratwurst, and packaged luncheon meats. Salted nuts and seeds. Canned beans with salt. Dairy Whole or 2% milk, cream, half-and-half, and cream cheese. Whole-fat or sweetened yogurt. Full-fat cheeses or blue cheese. Nondairy creamers and whipped toppings. Processed cheese, cheese spreads, or cheese curds. Condiments Onion and garlic salt, seasoned salt,  table salt, and sea salt. Canned and packaged gravies. Worcestershire sauce. Tartar sauce. Barbecue sauce. Teriyaki sauce. Soy sauce, including reduced sodium. Steak sauce. Fish sauce. Oyster sauce. Cocktail sauce. Horseradish. Ketchup and mustard. Meat flavorings and tenderizers. Bouillon cubes. Hot sauce. Tabasco sauce. Marinades. Taco seasonings. Relishes. Fats and Oils Butter, stick margarine, lard, shortening, ghee, and bacon fat. Coconut, palm kernel, or palm oils. Regular salad dressings. Other Pickles and olives. Salted popcorn and pretzels. The items listed above may not be a complete list of foods and beverages to avoid. Contact your dietitian for more information. WHERE CAN I FIND MORE INFORMATION? National Heart, Lung, and Blood Institute: travelstabloid.com Document  Released: 01/06/2011 Document Revised: 06/03/2013 Document Reviewed: 11/21/2012 [facility] Patient Information 2015 [place], [place]. This information is not intended to replace advice given to you by your health care provider. Make sure you discuss any questions you have with your health care provider.  Food Basics for Chronic Kidney Disease When your kidneys are not working well, they cannot remove waste and excess substances from your blood as effectively as they did before. This can lead to a buildup and imbalance of these substances, which can affect how your body functions. This buildup can also make your kidneys work harder, causing even more damage. You may need to eat less of certain foods that can lead to the buildup of these substances in your body. By making the changes to your diet that are recommended by your dietitian or health care provider, you could possibly help prevent further kidney damage and delay or prevent the need for dialysis. The following information can help give you a basic understanding of these substances and how they affect your bodily functions. The information  also gives examples of foods that contain the highest amounts of these substances. WHAT DO I NEED TO KNOW ABOUT SUBSTANCES IN MY FOOD THAT I MAY NEED TO ADJUST? Food adjustments will be different for each person with chronic kidney disease. It is important that you see a dietitian who can help you determine the specific adjustments that you will need to make for each of the following substances: Potassium Potassium affects how steadily your heart beats. If too much potassium builds up in your blood, it can cause an irregular heartbeat or even a heart attack. Examples of foods rich in potassium include:  Milk.  Fruits.  Vegetables. Phosphorus Phosphorus is a mineral found in your bones. A balance between calcium and phosphorous is needed to build and maintain healthy bones. Too much phosphorus pulls calcium from your bones. This can make your bones weak and more likely to break. Too much phosphorus can also make your skin itch. Examples of foods rich in phosphorus include:  Milk and cheese.  Dried beans.  Peas.  Colas.  Nuts and peanut butter. Animal Protein Animal protein helps you make and keep muscle. It also helps in the repair of your body's cells and tissues. One of the natural breakdown products of protein is a waste product called urea. When your kidneys are not working properly, they cannot remove wastes such as urea like they did before you developed chronic kidney disease. You will likely need to limit the amount of protein you eat to help prevent a buildup of urea in your blood. Examples of animal protein include:  Meat (all types).  Fish and seafood.  Poultry.  Eggs. Sodium Sodium, which is found in salt, helps maintain a healthy balance of fluids in your body. Too much sodium can increase your blood pressure level and have a negative affect on the function of your heart and lungs. Too much sodium also can cause your body to retain too much fluid, making your kidneys  work harder. Examples of foods with high levels of sodium include:  Salt seasonings.  Soy sauce.  Cured and processed meats.  Salted crackers and snack foods.  Fast food.  Canned soups and most canned foods. Glucose Glucose provides energy for your body. If you have diabetes mellitus that is not properly controlled, you have too much glucose in your blood. Too much glucose in your blood can worsen the function of your kidneys by damaging small blood  vessels. This prevents enough blood flow to your kidneys to give them what they need to work. If you have diabetes mellitus and chronic kidney disease, it is important to maintain your blood glucose at a level recommended by your health care provider. SHOULD I TAKE A VITAMIN AND MINERAL SUPPLEMENT? Because you may need to avoid eating certain foods, you may not get all of the vitamins and minerals that would normally come from those foods. Your health care provider or dietitian may recommend that you take a supplement to ensure that you get all of the vitamins and minerals that your body needs.  Document Released: 04/09/2002 Document Revised: 06/03/2013 Document Reviewed: 12/14/2012 [facility] Patient Information 2015 [place], [place]. This information is not intended to replace advice given to you by your health care provider. Make sure you discuss any questions you have with your health care provider.  How to Avoid Diabetes Problems You can do a lot to prevent or slow down diabetes problems. Following your diabetes plan and taking care of yourself can reduce your risk of serious or life-threatening complications. Below, you will find certain things you can do to prevent diabetes problems. MANAGE YOUR DIABETES Follow your health care provider's, nurse educator's, and dietitian's instructions for managing your diabetes. They will teach you the basics of diabetes care. They can help answer questions you may have. Learn about diabetes and make healthy  choices regarding eating and physical activity. Monitor your blood glucose level regularly. Your health care provider will help you decide how often to check your blood glucose level depending on your treatment goals and how well you are meeting them.  DO NOT USE NICOTINE Nicotine and diabetes are a dangerous combination. Nicotine raises your risk for diabetes problems. If you quit using nicotine, you will lower your risk for heart attack, stroke, nerve disease, and kidney disease. Your cholesterol and your blood pressure levels may improve. Your blood circulation will also improve. Do not use any tobacco products, including cigarettes, chewing tobacco, or electronic cigarettes. If you need help quitting, ask your health care provider. KEEP YOUR BLOOD PRESSURE UNDER CONTROL Keeping your blood pressure under control will help prevent damage to your eyes, kidneys, heart, and blood vessels. Blood pressure consists of two numbers. The top number should be below 120, and the bottom number should be below 80 (120/80). Keep your blood pressure as close to these numbers as you can. If you already have kidney disease, you may want even lower blood pressure to protect your kidneys. Talk to your health care provider to make sure that your blood pressure goal is right for your needs. Meal planning, medicines, and exercise can help you reach your blood pressure target. Have your blood pressure checked at every visit with your health care provider. KEEP YOUR CHOLESTEROL UNDER CONTROL Normal cholesterol levels will help prevent heart disease and stroke. These are the biggest health problems for people with diabetes. Keeping cholesterol levels under control can also help with blood flow. Have your cholesterol level checked at least once a year. Your health care provider may prescribe a medicine known as a statin. Statins lower your cholesterol. If you are not taking a statin, ask your health care provider if you should be.  Meal planning, exercise, and medicines can help you reach your cholesterol targets.  SCHEDULE AND KEEP YOUR ANNUAL PHYSICAL EXAMS AND EYE EXAMS Your health care provider will tell you how often he or she wants to see you depending on your plan of treatment.  It is important that you keep these appointments so that possible problems can be identified early and complications can be avoided or treated.  Every visit with your health care provider should include your weight, blood pressure, and an evaluation of your blood glucose control.  Your hemoglobin A1c should be checked:  At least twice a year if you are at your goal.  Every 3 months if there are changes in treatment.  If you are not meeting your goals.  Your blood lipids should be checked yearly. You should also be checked yearly to see if you have protein in your urine (microalbumin).  Schedule a dilated eye exam within 5 years of your diagnosis if you have type 1 diabetes, and then yearly. Schedule a dilated eye exam at diagnosis if you have type 2 diabetes, and then yearly. All exams thereafter can be extended to every 2 to 3 years if one or more exams have been normal. KEEP YOUR VACCINES CURRENT The flu vaccine is recommended yearly. The formula for the vaccine changes every year and needs to be updated for the best protection against current viruses. It is recommended that people with diabetes who are over 84 years old get the pneumonia vaccine. In some cases, two separate shots may be given. Ask your health care provider if your pneumonia vaccination is up-to-date. However, there are some instances where another vaccine is recommended. Check with your health care provider. TAKE CARE OF YOUR FEET  Diabetes may cause you to have a poor blood supply (circulation) to your legs and feet. Because of this, the skin may be thinner, break easier, and heal more slowly. You also may have nerve damage in your legs and feet, causing decreased feeling.  You may not notice minor injuries to your feet that could lead to serious problems or infections. Taking care of your feet is very important. Visual foot exams are performed at every routine medical visit. The exams check for cuts, injuries, or other problems with the feet. A comprehensive foot exam should be done yearly. This includes visual inspection as well as assessing foot pulses and testing for loss of sensation. You should also do the following:  Inspect your feet daily for cuts, calluses, blisters, ingrown toenails, and signs of infection, such as redness, swelling, or pus.  Wash and dry your feet thoroughly, especially between the toes.  Avoid soaking your feet regularly in hot water baths.  Moisturize dry skin with lotion, avoiding areas between your toes.  Cut toenails straight across and file the edges.  Avoid shoes that do not fit well or have areas that irritate your skin.  Avoid going barefooted or wearing only socks. Your feet need protection. TAKE CARE OF YOUR TEETH People with poorly controlled diabetes are more likely to have gum (periodontal) disease. These infections make diabetes harder to control. Periodontal diseases, if left untreated, can lead to tooth loss. Brush your teeth twice a day, floss, and see your dentist for checkups and cleaning every 6 months, or 2 times a year. ASK YOUR HEALTH CARE PROVIDER ABOUT TAKING ASPIRIN Taking aspirin daily is recommended to help prevent cardiovascular disease in people with and without diabetes. Ask your health care provider if this would benefit you and what dose he or she would recommend. DRINK RESPONSIBLY Moderate amounts of alcohol (less than 1 drink per day for adult women and less than 2 drinks per day for adult men) have a minimal effect on blood glucose if ingested with food. It  is important to eat food with alcohol to avoid hypoglycemia. People should avoid alcohol if they have a history of alcohol abuse or dependence,  if they are pregnant, and if they have liver disease, pancreatitis, advanced neuropathy, or severe hypertriglyceridemia. LESSEN STRESS Living with diabetes can be stressful. When you are under stress, your blood glucose may be affected in two ways:  Stress hormones may cause your blood glucose to rise.  You may be distracted from taking good care of yourself. It is a good idea to be aware of your stress level and make changes that are necessary to help you better manage challenging situations. Support groups, planned relaxation, a hobby you enjoy, meditation, healthy relationships, and exercise all work to lower your stress level. If your efforts do not seem to be helping, get help from your health care provider or a trained mental health professional. Document Released: 10/05/2010 Document Revised: 06/03/2013 Document Reviewed: 03/13/2013 [facility] Patient Information 2015 [place], [place]. This information is not intended to replace advice given to you by your health care provider. Make sure you discuss any questions you have with your health care provider.  Managing Your High Blood Pressure Blood pressure is a measurement of how forceful your blood is pressing against the walls of the arteries. Arteries are muscular tubes within the circulatory system. Blood pressure does not stay the same. Blood pressure rises when you are active, excited, or nervous; and it lowers during sleep and relaxation. If the numbers measuring your blood pressure stay above normal most of the time, you are at risk for health problems. High blood pressure (hypertension) is a long-term (chronic) condition in which blood pressure is elevated. A blood pressure reading is recorded as two numbers, such as 120 over 80 (or 120/80). The first, higher number is called the systolic pressure. It is a measure of the pressure in your arteries as the heart beats. The second, lower number is called the diastolic pressure. It is a measure of  the pressure in your arteries as the heart relaxes between beats.  Keeping your blood pressure in a normal range is important to your overall health and prevention of health problems, such as heart disease and stroke. When your blood pressure is uncontrolled, your heart has to work harder than normal. High blood pressure is a very common condition in adults because blood pressure tends to rise with age. Men and women are equally likely to have hypertension but at different times in life. Before age 25, men are more likely to have hypertension. After 48 years of age, women are more likely to have it. Hypertension is especially common in African Americans. This condition often has no signs or symptoms. The cause of the condition is usually not known. Your caregiver can help you come up with a plan to keep your blood pressure in a normal, healthy range. BLOOD PRESSURE STAGES Blood pressure is classified into four stages: normal, prehypertension, stage 1, and stage 2. Your blood pressure reading will be used to determine what type of treatment, if any, is necessary. Appropriate treatment options are tied to these four stages:  Normal  Systolic pressure (mm Hg): below 120.  Diastolic pressure (mm Hg): below 80. Prehypertension  Systolic pressure (mm Hg): 120 to 139.  Diastolic pressure (mm Hg): 80 to 89. Stage1  Systolic pressure (mm Hg): 140 to 159.  Diastolic pressure (mm Hg): 90 to 99. Stage2  Systolic pressure (mm Hg): 160 or above.  Diastolic pressure (mm Hg): 100 or  above. RISKS RELATED TO HIGH BLOOD PRESSURE Managing your blood pressure is an important responsibility. Uncontrolled high blood pressure can lead to:  A heart attack.  A stroke.  A weakened blood vessel (aneurysm).  Heart failure.  Kidney damage.  Eye damage.  Metabolic syndrome.  Memory and concentration problems. HOW TO MANAGE YOUR BLOOD PRESSURE Blood pressure can be managed effectively with lifestyle  changes and medicines (if needed). Your caregiver will help you come up with a plan to bring your blood pressure within a normal range. Your plan should include the following: Education  Read all information provided by your caregivers about how to control blood pressure.  Educate yourself on the latest guidelines and treatment recommendations. New research is always being done to further define the risks and treatments for high blood pressure. Lifestylechanges  Control your weight.  Avoid smoking.  Stay physically active.  Reduce the amount of salt in your diet.  Reduce stress.  Control any chronic conditions, such as high cholesterol or diabetes.  Reduce your alcohol intake. Medicines  Several medicines (antihypertensive medicines) are available, if needed, to bring blood pressure within a normal range. Communication  Review all the medicines you take with your caregiver because there may be side effects or interactions.  Talk with your caregiver about your diet, exercise habits, and other lifestyle factors that may be contributing to high blood pressure.  See your caregiver regularly. Your caregiver can help you create and adjust your plan for managing high blood pressure. RECOMMENDATIONS FOR TREATMENT AND FOLLOW-UP  The following recommendations are based on current guidelines for managing high blood pressure in nonpregnant adults. Use these recommendations to identify the proper follow-up period or treatment option based on your blood pressure reading. You can discuss these options with your caregiver.  Systolic pressure of 889 to 169 or diastolic pressure of 80 to 89: Follow up with your caregiver as directed.  Systolic pressure of 450 to 388 or diastolic pressure of 90 to 100: Follow up with your caregiver within 2 months.  Systolic pressure above 828 or diastolic pressure above 003: Follow up with your caregiver within 1 month.  Systolic pressure above 491 or  diastolic pressure above 791: Consider antihypertensive therapy; follow up with your caregiver within 1 week.  Systolic pressure above 505 or diastolic pressure above 697: Begin antihypertensive therapy; follow up with your caregiver within 1 week. Document Released: 10/12/2011 Document Reviewed: 10/12/2011 [facility] Patient Information 2015 [place]. This information is not intended to replace advice given to you by your health care provider. Make sure you discuss any questions you have with your health care provider.  Peripheral Edema You have swelling in your legs (peripheral edema). This swelling is due to excess accumulation of salt and water in your body. Edema may be a sign of heart, kidney or liver disease, or a side effect of a medication. It may also be due to problems in the leg veins. Elevating your legs and using special support stockings may be very helpful, if the cause of the swelling is due to poor venous circulation. Avoid long periods of standing, whatever the cause. Treatment of edema depends on identifying the cause. Chips, pretzels, pickles and other salty foods should be avoided. Restricting salt in your diet is almost always needed. Water pills (diuretics) are often used to remove the excess salt and water from your body via urine. These medicines prevent the kidney from reabsorbing sodium. This increases urine flow. Diuretic treatment may also result in  lowering of potassium levels in your body. Potassium supplements may be needed if you have to use diuretics daily. Daily weights can help you keep track of your progress in clearing your edema. You should call your caregiver for follow up care as recommended. SEEK IMMEDIATE MEDICAL CARE IF:   You have increased swelling, pain, redness, or heat in your legs.  You develop shortness of breath, especially when lying down.  You develop chest or abdominal pain, weakness, or fainting.  You have a fever. Document Released:  02/25/2004 Document Revised: 04/11/2011 Document Reviewed: 02/04/2009 [facility] Patient Information 2015 [place], [place]. This information is not intended to replace advice given to you by your health care provider. Make sure you discuss any questions you have with your health care provider.

## 2014-09-30 ENCOUNTER — Encounter (HOSPITAL_COMMUNITY): Payer: Self-pay | Admitting: Emergency Medicine

## 2014-09-30 ENCOUNTER — Emergency Department (HOSPITAL_COMMUNITY): Payer: PRIVATE HEALTH INSURANCE

## 2014-09-30 ENCOUNTER — Emergency Department (HOSPITAL_COMMUNITY)
Admission: EM | Admit: 2014-09-30 | Discharge: 2014-09-30 | Disposition: A | Discharge status: Home / Self Care | Payer: PRIVATE HEALTH INSURANCE | Attending: Emergency Medicine | Admitting: Emergency Medicine

## 2014-09-30 DIAGNOSIS — Z8701 Personal history of pneumonia (recurrent): Secondary | ICD-10-CM | POA: Diagnosis not present

## 2014-09-30 DIAGNOSIS — Z794 Long term (current) use of insulin: Secondary | ICD-10-CM | POA: Diagnosis not present

## 2014-09-30 DIAGNOSIS — I129 Hypertensive chronic kidney disease with stage 1 through stage 4 chronic kidney disease, or unspecified chronic kidney disease: Secondary | ICD-10-CM | POA: Diagnosis not present

## 2014-09-30 DIAGNOSIS — I509 Heart failure, unspecified: Secondary | ICD-10-CM | POA: Diagnosis not present

## 2014-09-30 DIAGNOSIS — N183 Chronic kidney disease, stage 3 (moderate): Secondary | ICD-10-CM | POA: Insufficient documentation

## 2014-09-30 DIAGNOSIS — E119 Type 2 diabetes mellitus without complications: Secondary | ICD-10-CM | POA: Insufficient documentation

## 2014-09-30 DIAGNOSIS — M791 Myalgia: Secondary | ICD-10-CM | POA: Diagnosis not present

## 2014-09-30 DIAGNOSIS — B349 Viral infection, unspecified: Secondary | ICD-10-CM

## 2014-09-30 DIAGNOSIS — E785 Hyperlipidemia, unspecified: Secondary | ICD-10-CM | POA: Insufficient documentation

## 2014-09-30 DIAGNOSIS — R05 Cough: Secondary | ICD-10-CM | POA: Diagnosis present

## 2014-09-30 DIAGNOSIS — Z79899 Other long term (current) drug therapy: Secondary | ICD-10-CM | POA: Diagnosis not present

## 2014-09-30 MED ORDER — GUAIFENESIN-CODEINE 100-10 MG/5ML PO SOLN: 5.0000 mL | Freq: Three times a day (TID) | ORAL | Status: DC | PRN

## 2014-09-30 MED ORDER — ACETAMINOPHEN 500 MG PO TABS: 1000.0000 mg | ORAL_TABLET | Freq: Four times a day (QID) | ORAL | Status: DC | PRN

## 2014-09-30 NOTE — ED Provider Notes (Signed)
History  This chart was scribed for non-physician practitioner, Domenic Moras, PA-C,working with Varney Biles, MD, by Marlowe Kays, ED Scribe. This patient was seen in room TR03C/TR03C and the patient's care was started at 5:15 PM.  Chief Complaint  Patient presents with  . URI   The history is provided by the patient and medical records. No language interpreter was used.    HPI Comments:  Jade Bond is a 49 y.o. female who presents to the Emergency Department complaining of sneezing, watery eyes, dry cough and sore throat that began yesterday. She reports associated generalized body aches, chest pain secondary to cough, HA and congestion. She reports taking a Claritin yesterday that helped relieve her HA. She has not taken anything for pain. She denies modifying factors. She denies fever, chills, rash, abdominal pain, nausea or vomiting. She does not have a PCP. She denies smoking.  Past Medical History  Diagnosis Date  . Hypertension   . Hyperlipidemia   . Diabetes mellitus   . Pneumonia 07/04/2013  . CKD (chronic kidney disease), stage III   . Normocytic anemia   . Chest pain     a. 2D echo 01/2010 - mild TR, LVEF 60% with normal wall thickness, trace MR, mild TR, normal RV, trace pericardial effusion. b. Normal nuc 03/2011 at [facility].  . Hypoalbuminemia   . CHF (congestive heart failure)    Past Surgical History  Procedure Laterality Date  . Tonsillectomy  1980  . Cardiac catheterization  ~ 2008    negative, no intervention needed  . Tubal ligation  1995   Family History  Problem Relation Age of Onset  . Malignant hyperthermia Mother   . Heart attack Father     Age 38  . Diabetes Mellitus II Mother   . Kidney disease Father   . Diabetes Mellitus II Sister   . Hypertension Sister    Social History  Substance Use Topics  . Smoking status: Never Smoker   . Smokeless tobacco: Never Used  . Alcohol Use: Yes     Comment: occasional drink of wine - 1 glass/week or less    OB History    No data available     Review of Systems  Constitutional: Negative for fever and chills.  HENT: Positive for congestion, sneezing and sore throat.   Eyes: Positive for discharge (watery).  Respiratory: Positive for cough.   Cardiovascular: Positive for chest pain (soreness secondary to cough).  Gastrointestinal: Negative for nausea, vomiting and abdominal pain.  Musculoskeletal: Positive for myalgias.  Skin: Negative for rash.  Neurological: Positive for headaches.    Allergies  Review of patient's allergies indicates no known allergies.  Home Medications   Prior to Admission medications   Medication Sig Start Date End Date Taking? Authorizing Provider  acetaminophen (TYLENOL) 500 MG tablet Take 1,000 mg by mouth every 6 (six) hours as needed for mild pain.    Historical Provider, MD  atorvastatin (LIPITOR) 80 MG tablet Take 1 tablet (80 mg total) by mouth daily. Patient not taking: Reported on 07/17/2014 01/03/14   Fay Records, MD  blood glucose meter kit and supplies KIT Dispense based on patient and insurance preference. Use up to four times daily as directed. (FOR ICD-9 250.00, 250.01). 07/18/14   Linton Flemings, MD  furosemide (LASIX) 20 MG tablet Take 1 tablet (20 mg total) by mouth daily. Patient not taking: Reported on 07/17/2014 11/25/13   Alvina Chou, PA-C  glipiZIDE (GLUCOTROL) 10 MG tablet Take 1 tablet (10  mg total) by mouth 2 (two) times daily before a meal. Patient not taking: Reported on 07/18/2014 02/07/14   Philemon Kingdom, MD  guaiFENesin-codeine 100-10 MG/5ML syrup Take 5 mLs by mouth 3 (three) times daily as needed for cough. Patient not taking: Reported on 02/07/2014 12/23/13   Comer Locket, PA-C  insulin aspart (NOVOLOG) 100 UNIT/ML injection Sliding scale as directed. Pt takes anywhere from 4-10 units when blood sugar is >150. diag code 250.62. Insulin dependent 07/18/14   Linton Flemings, MD  Insulin Glargine (LANTUS SOLOSTAR) 100 UNIT/ML Solostar  Pen Inject 25 Units into the skin daily at 10 pm. 07/18/14   Linton Flemings, MD  Insulin Pen Needle (BD PEN NEEDLE NANO U/F) 32G X 4 MM MISC Use 1x a day 07/18/14   Linton Flemings, MD  metoprolol succinate (TOPROL-XL) 50 MG 24 hr tablet Take 1 tablet (50 mg total) by mouth daily. Take with or immediately following a meal. 07/18/14   Linton Flemings, MD  nitroGLYCERIN (NITROSTAT) 0.4 MG SL tablet Place 0.4 mg under the tongue every 5 (five) minutes as needed. For chest pain    Historical Provider, MD   Triage Vitals: BP 168/86 mmHg  Pulse 105  Temp(Src) 99.2 F (37.3 C) (Oral)  Resp 18  Ht 5' 7"  (1.702 m)  Wt 193 lb (87.544 kg)  BMI 30.22 kg/m2  SpO2 98%  LMP 09/30/2014 Physical Exam  Constitutional: She is oriented to person, place, and time. She appears well-developed and well-nourished.  HENT:  Head: Normocephalic and atraumatic.  Right Ear: Tympanic membrane and ear canal normal.  Left Ear: Tympanic membrane and ear canal normal.  Nose: Nose normal.  Mouth/Throat: Uvula is midline, oropharynx is clear and moist and mucous membranes are normal. No oropharyngeal exudate, posterior oropharyngeal edema or posterior oropharyngeal erythema.  Eyes: EOM are normal.  Neck: Normal range of motion.  No nuchal rigidity  Cardiovascular: Normal rate, regular rhythm and normal heart sounds.  Exam reveals no gallop and no friction rub.   No murmur heard. Pulmonary/Chest: Effort normal and breath sounds normal. No respiratory distress. She has no wheezes. She has no rales.  Abdominal: Soft. There is no tenderness.  Musculoskeletal: Normal range of motion.  Neurological: She is alert and oriented to person, place, and time.  Skin: Skin is warm and dry. No rash noted.  Psychiatric: She has a normal mood and affect. Her behavior is normal.  Nursing note and vitals reviewed.   ED Course  Procedures (including critical care time) DIAGNOSTIC STUDIES: Oxygen Saturation is 98% on RA, normal by my interpretation.    COORDINATION OF CARE: 5:19 PM- Informed pt that symptoms were likely viral. Doubt PE because of global URI sxs and no SOB.  Doubt ACS as pt has no risk factors.  Pt verbalizes understanding and agrees to plan.  Imaging Review Dg Chest 2 View  09/30/2014   CLINICAL DATA:  Cough and congestion for 2 days.  EXAM: CHEST  2 VIEW  COMPARISON:  July 17, 2014.  FINDINGS: The heart size and mediastinal contours are within normal limits. There is no focal infiltrate, pulmonary edema, or pleural effusion. The visualized skeletal structures are unremarkable.  IMPRESSION: No active cardiopulmonary disease.   Electronically Signed   By: Abelardo Diesel M.D.   On: 09/30/2014 15:55   I have personally reviewed and evaluated these images and lab results as part of my medical decision-making.   EKG Interpretation None     ED ECG REPORT   Date: 09/30/2014  Rate: 109  Rhythm: sinus tachycardia  QRS Axis: normal  Intervals: normal  ST/T Wave abnormalities: normal  Conduction Disutrbances:none  Narrative Interpretation:   Old EKG Reviewed: none available  I have personally reviewed the EKG tracing and agree with the computerized printout as noted.   MDM   Final diagnoses:  Viral infection    BP 170/86 mmHg  Pulse 89  Temp(Src) 99.8 F (37.7 C) (Oral)  Resp 16  Ht 5' 7"  (1.702 m)  Wt 193 lb (87.544 kg)  BMI 30.22 kg/m2  SpO2 99%  LMP 09/30/2014 BP elevated, pt informed to have it recheck by PCP.  I personally performed the services described in this documentation, which was scribed in my presence. The recorded information has been reviewed and is accurate.    Domenic Moras, PA-C 09/30/14 [place], MD 10/01/14 [id number]

## 2014-09-30 NOTE — Discharge Instructions (Signed)

## 2014-09-30 NOTE — ED Notes (Signed)
Patient states nasal congestion and chest congestion.   Patient states her chest hurts when she coughs.   Patient states cough is dry.   Patient states getting white mucous when she blows her nose.   Patient states generalized body aches.

## 2014-11-13 ENCOUNTER — Encounter (HOSPITAL_COMMUNITY): Payer: Self-pay | Admitting: *Deleted

## 2014-11-13 ENCOUNTER — Emergency Department (HOSPITAL_COMMUNITY)
Admission: EM | Admit: 2014-11-13 | Discharge: 2014-11-13 | Disposition: A | Discharge status: Home / Self Care | Payer: PRIVATE HEALTH INSURANCE | Attending: Emergency Medicine | Admitting: Emergency Medicine

## 2014-11-13 DIAGNOSIS — I129 Hypertensive chronic kidney disease with stage 1 through stage 4 chronic kidney disease, or unspecified chronic kidney disease: Secondary | ICD-10-CM | POA: Insufficient documentation

## 2014-11-13 DIAGNOSIS — R030 Elevated blood-pressure reading, without diagnosis of hypertension: Secondary | ICD-10-CM | POA: Insufficient documentation

## 2014-11-13 DIAGNOSIS — Z8701 Personal history of pneumonia (recurrent): Secondary | ICD-10-CM | POA: Insufficient documentation

## 2014-11-13 DIAGNOSIS — H02843 Edema of right eye, unspecified eyelid: Secondary | ICD-10-CM | POA: Insufficient documentation

## 2014-11-13 DIAGNOSIS — N182 Chronic kidney disease, stage 2 (mild): Secondary | ICD-10-CM | POA: Insufficient documentation

## 2014-11-13 DIAGNOSIS — E119 Type 2 diabetes mellitus without complications: Secondary | ICD-10-CM | POA: Insufficient documentation

## 2014-11-13 DIAGNOSIS — IMO0001 Reserved for inherently not codable concepts without codable children: Secondary | ICD-10-CM

## 2014-11-13 DIAGNOSIS — I10 Essential (primary) hypertension: Secondary | ICD-10-CM

## 2014-11-13 DIAGNOSIS — E785 Hyperlipidemia, unspecified: Secondary | ICD-10-CM | POA: Insufficient documentation

## 2014-11-13 DIAGNOSIS — I509 Heart failure, unspecified: Secondary | ICD-10-CM | POA: Insufficient documentation

## 2014-11-13 DIAGNOSIS — Z794 Long term (current) use of insulin: Secondary | ICD-10-CM | POA: Insufficient documentation

## 2014-11-13 MED ORDER — METOPROLOL SUCCINATE ER 50 MG PO TB24: 50.0000 mg | ORAL_TABLET | Freq: Every day | ORAL | Status: DC

## 2014-11-13 MED ORDER — FAMOTIDINE 20 MG PO TABS: 20.0000 mg | ORAL_TABLET | Freq: Two times a day (BID) | ORAL | Status: DC

## 2014-11-13 MED ORDER — GLIPIZIDE 10 MG PO TABS: 10.0000 mg | ORAL_TABLET | Freq: Two times a day (BID) | ORAL | Status: DC

## 2014-11-13 MED ORDER — DIPHENHYDRAMINE HCL 25 MG PO CAPS
50.0000 mg | ORAL_CAPSULE | Freq: Once | ORAL | Status: AC
  Administered 2014-11-13: 50 mg via ORAL
  Filled 2014-11-13: qty 2

## 2014-11-13 MED ORDER — FAMOTIDINE 20 MG PO TABS
20.0000 mg | ORAL_TABLET | Freq: Once | ORAL | Status: AC
  Administered 2014-11-13: 20 mg via ORAL
  Filled 2014-11-13: qty 1

## 2014-11-13 MED ORDER — BLOOD GLUCOSE MONITOR KIT: PACK | Status: DC

## 2014-11-13 MED ORDER — INSULIN GLARGINE 100 UNIT/ML SOLOSTAR PEN: 25.0000 [IU] | PEN_INJECTOR | Freq: Every day | SUBCUTANEOUS | Status: DC

## 2014-11-13 MED ORDER — NITROGLYCERIN 0.4 MG SL SUBL: 0.4000 mg | SUBLINGUAL_TABLET | SUBLINGUAL | Status: DC | PRN

## 2014-11-13 MED ORDER — ATORVASTATIN CALCIUM 80 MG PO TABS: 80.0000 mg | ORAL_TABLET | Freq: Every day | ORAL | Status: DC

## 2014-11-13 NOTE — ED Provider Notes (Signed)
CSN: [id number]     Arrival date & time 11/13/14  0805 History   First MD Initiated Contact with Patient 11/13/14 [phone]     Chief Complaint  Patient presents with  . Eye Problem     (Consider location/radiation/quality/duration/timing/severity/associated sxs/prior Treatment) HPI   Blood pressure 211/98, pulse 70, temperature 98.3 F (36.8 C), temperature source Oral, resp. rate 16, height 5' 7"  (1.702 m), weight 182 lb (82.555 kg), last menstrual period 11/01/2014, SpO2 100 %.  Jade Bond is a 49 y.o. female complaining of right eyelid swelling which she noticed this morning upon wakening. Patient states that she was in her normal state of health when she went to bed last night. She is not a contact lens wearer, there is no change in her vision, pain with eye movement. States that she thinks she may been bitten by a bug overnight. Denies pruritus. She states she's not had any of her medications for several months because she is uninsured and cannot afford them or primary care. Denies HA, change in vision, change in PO intake, CP, SOB, cough, fever, abdominal pain, N/V, change in bowel or bladder habits, difficulty ambulating, recent travel, rash, SI, HI, AVH, drug or EtOH abuse.    Past Medical History  Diagnosis Date  . Hypertension   . Hyperlipidemia   . Diabetes mellitus   . Pneumonia 07/04/2013  . CKD (chronic kidney disease), stage III   . Normocytic anemia   . Chest pain     a. 2D echo 01/2010 - mild TR, LVEF 60% with normal wall thickness, trace MR, mild TR, normal RV, trace pericardial effusion. b. Normal nuc 03/2011 at [facility]  . Hypoalbuminemia   . CHF (congestive heart failure) [facility])    Past Surgical History  Procedure Laterality Date  . Tonsillectomy  1980  . Cardiac catheterization  ~ 2008    negative, no intervention needed  . Tubal ligation  1995   Family History  Problem Relation Age of Onset  . Malignant hyperthermia Mother   . Heart attack Father     Age 78   . Diabetes Mellitus II Mother   . Kidney disease Father   . Diabetes Mellitus II Sister   . Hypertension Sister    Social History  Substance Use Topics  . Smoking status: Never Smoker   . Smokeless tobacco: Never Used  . Alcohol Use: Yes     Comment: occasional drink of wine - 1 glass/week or less   OB History    No data available     Review of Systems  10 systems reviewed and found to be negative, except as noted in the HPI.   Allergies  Review of patient's allergies indicates no known allergies.  Home Medications   Prior to Admission medications   Medication Sig Start Date End Date Taking? Authorizing Provider  acetaminophen (TYLENOL) 500 MG tablet Take 2 tablets (1,000 mg total) by mouth every 6 (six) hours as needed for moderate pain. 09/30/14   Domenic Moras, PA-C  atorvastatin (LIPITOR) 80 MG tablet Take 1 tablet (80 mg total) by mouth daily. Patient not taking: Reported on 07/17/2014 01/03/14   Fay Records, MD  blood glucose meter kit and supplies KIT Dispense based on patient and insurance preference. Use up to four times daily as directed. (FOR ICD-9 250.00, 250.01). 07/18/14   Linton Flemings, MD  famotidine (PEPCID) 20 MG tablet Take 1 tablet (20 mg total) by mouth 2 (two) times daily. 11/13/14   Elmyra Ricks  Kammie Scioli, PA-C  furosemide (LASIX) 20 MG tablet Take 1 tablet (20 mg total) by mouth daily. Patient not taking: Reported on 07/17/2014 11/25/13   Alvina Chou, PA-C  glipiZIDE (GLUCOTROL) 10 MG tablet Take 1 tablet (10 mg total) by mouth 2 (two) times daily before a meal. Patient not taking: Reported on 07/18/2014 02/07/14   Philemon Kingdom, MD  guaiFENesin-codeine 100-10 MG/5ML syrup Take 5 mLs by mouth 3 (three) times daily as needed for cough. 09/30/14   Domenic Moras, PA-C  insulin aspart (NOVOLOG) 100 UNIT/ML injection Sliding scale as directed. Pt takes anywhere from 4-10 units when blood sugar is >150. diag code 250.62. Insulin dependent 07/18/14   Linton Flemings, MD  Insulin  Glargine (LANTUS SOLOSTAR) 100 UNIT/ML Solostar Pen Inject 25 Units into the skin daily at 10 pm. 07/18/14   Linton Flemings, MD  Insulin Pen Needle (BD PEN NEEDLE NANO U/F) 32G X 4 MM MISC Use 1x a day 07/18/14   Linton Flemings, MD  metoprolol succinate (TOPROL-XL) 50 MG 24 hr tablet Take 1 tablet (50 mg total) by mouth daily. Take with or immediately following a meal. 07/18/14   Linton Flemings, MD  nitroGLYCERIN (NITROSTAT) 0.4 MG SL tablet Place 0.4 mg under the tongue every 5 (five) minutes as needed. For chest pain    Historical Provider, MD   BP 211/98 mmHg  Pulse 70  Temp(Src) 98.3 F (36.8 C) (Oral)  Resp 16  Ht 5' 7"  (1.702 m)  Wt 182 lb (82.555 kg)  BMI 28.50 kg/m2  SpO2 100%  LMP 11/01/2014 Physical Exam  Constitutional: She is oriented to person, place, and time. She appears well-developed and well-nourished. No distress.  HENT:  Head: Normocephalic and atraumatic.  Mouth/Throat: Oropharynx is clear and moist.  Patient has swelling and edema to right upper eyelid with no warmth, no stye appreciated. No injection in the eye, no tearing from the eye, extraocular movements are intact without pain or diplopia.  Eyes: Conjunctivae and EOM are normal. Pupils are equal, round, and reactive to light.  Neck: Normal range of motion.  Cardiovascular: Normal rate, regular rhythm and intact distal pulses.   Pulmonary/Chest: Effort normal and breath sounds normal.  Abdominal: Soft. There is no tenderness.  Musculoskeletal: Normal range of motion.  Neurological: She is alert and oriented to person, place, and time.  Skin: She is not diaphoretic.  Psychiatric: She has a normal mood and affect.  Nursing note and vitals reviewed.   ED Course  Procedures (including critical care time) Labs Review Labs Reviewed - No data to display  Imaging Review No results found. I have personally reviewed and evaluated these images and lab results as part of my medical decision-making.   EKG  Interpretation None      MDM   Final diagnoses:  Swelling of right eyelid  Elevated blood pressure    Filed Vitals:   11/13/14 0816 11/13/14 0817 11/13/14 0943  BP:  211/98 190/102  Pulse:  70 80  Temp:  98.3 F (36.8 C) 98 F (36.7 C)  TempSrc:  Oral Oral  Resp:  16 16  Height: 5' 7"  (1.702 m)    Weight: 182 lb (82.555 kg)    SpO2:  100% 100%    Medications  famotidine (PEPCID) tablet 20 mg (20 mg Oral Given 11/13/14 0844)  diphenhydrAMINE (BENADRYL) capsule 50 mg (50 mg Oral Given 11/13/14 0844)    Jade Bond is a pleasant 49 y.o. female presenting with swelling to right upper eyelid, asymptomatic.  Doubt this is a preseptal cellulitis. More likely allergy. Of note, patient's blood pressure is significantly elevated. She hasn't taken her medications in months because she lost her insurance. Is a asymptomatic hypertension with no signs of endorgan damage. I have discussed the case with case management who have arranged primary care for her out of the sickle cell clinic. We'll write her a prescription for her prior medications. Patient understands she can get these filled at no charge at the wellness Center.  Evaluation does not show pathology that would require ongoing emergent intervention or inpatient treatment. Pt is hemodynamically stable and mentating appropriately. Discussed findings and plan with patient/guardian, who agrees with care plan. All questions answered. Return precautions discussed and outpatient follow up given.   Discharge Medication List as of 11/13/2014  9:14 AM    START taking these medications   Details  famotidine (PEPCID) 20 MG tablet Take 1 tablet (20 mg total) by mouth 2 (two) times daily., Starting 11/13/2014, Until Discontinued, Print            Monico Blitz, PA-C 11/13/14 [place], MD 11/13/14 1146

## 2014-11-13 NOTE — ED Notes (Signed)
Declined W/C at D/C and was escorted to lobby by RN. 

## 2014-11-13 NOTE — Discharge Instructions (Signed)
Please follow with your primary care doctor in the next 5 days for high blood pressure evaluation. If you do not have a primary care doctor, present to urgent care. Reduce salt intake. Seek emergency medical care for unilateral weakness, slurring, change in vision, or chest pain and shortness of breath.  1 to 2 tablets of 25 mg Benadryl pills every 4-6 hours as needed to a maximum of 300 mg per day.   Do not hesitate to call 911 or return to the emergency room if you have any fever or any change in your vision or develop any shortness of breath, wheezing, tongue or lip swelling.  Please follow with your primary care doctor in the next 2 days for a check-up. They must obtain records for further management.   Do not hesitate to return to the Emergency Department for any new, worsening or concerning symptoms.

## 2014-11-13 NOTE — Care Management Note (Signed)
Case Management Note  Patient Details  Name: Jade Bond MRN: [id number] Date of Birth: 1965/04/26  Subjective/Objective:                  49 yo female pt reports she woke up this AM with the RT eye lid swollen.  Home alone.   Action/Plan: Follow for disposition needs.   Expected Discharge Date:     11/13/14             Expected Discharge Plan:  Home/Self Care  In-House Referral:  NA, PCP / [facility]  Discharge planning Services  CM Consult, Follow-up appt scheduled  Post Acute Care Choice:  NA Choice offered to:  Patient  DME Arranged:  N/A DME Agency:  NA  HH Arranged:  NA HH Agency:  NA  Status of Service:  Completed, signed off  Medicare Important Message Given:    Date Medicare IM Given:    Medicare IM give by:    Date Additional Medicare IM Given:    Additional Medicare Important Message give by:     If discussed at [place] of Stay Meetings, dates discussed:    Additional Comments: [name] J. Clydene Laming, RN, BSN, [place] [phone] ED CM consulted regarding PCP establishment and insurance enrollment. Pt presented to [facility] ED today with swollen eyelid. NCM met with pt at bedside; pt confirms not having access to f/u care with PCP or insurance coverage. Discussed with patient importance and benefits of establishing PCP, and not utilizing the ED for primary care needs. Pt verbalized understanding and is in agreement. Discussed other options, provided list of local  affordable PCPs.  Pt voiced interest in the [facility] and [place].  NCM advised that [facility]  Internal Medicine providers are seeing pts at [facility]. Pt verbalized understanding. NCM set up appointment with [name], NP 10/27 at 2:15.    [name], RN 11/13/2014, 9:01 AM

## 2014-11-13 NOTE — ED Notes (Signed)
Pt reports she woke up this AM with the RT eye lid swollen . Pt placed a cool cloth over RT eye and the swelling has reduced per PT. No pain at this time. Pt denies any vision changes.

## 2014-11-27 ENCOUNTER — Encounter: Payer: Self-pay | Admitting: Family Medicine

## 2014-11-27 ENCOUNTER — Ambulatory Visit (INDEPENDENT_AMBULATORY_CARE_PROVIDER_SITE_OTHER): Payer: PRIVATE HEALTH INSURANCE | Admitting: Family Medicine

## 2014-11-27 VITALS — BP 162/84 | HR 74 | Temp 98.2°F | Resp 16 | Ht 67.0 in | Wt 184.0 lb

## 2014-11-27 DIAGNOSIS — Z23 Encounter for immunization: Secondary | ICD-10-CM | POA: Diagnosis not present

## 2014-11-27 DIAGNOSIS — I10 Essential (primary) hypertension: Secondary | ICD-10-CM

## 2014-11-27 DIAGNOSIS — E1165 Type 2 diabetes mellitus with hyperglycemia: Secondary | ICD-10-CM | POA: Diagnosis not present

## 2014-11-27 DIAGNOSIS — E785 Hyperlipidemia, unspecified: Secondary | ICD-10-CM

## 2014-11-27 DIAGNOSIS — Z0000 Encounter for general adult medical examination without abnormal findings: Secondary | ICD-10-CM

## 2014-11-27 DIAGNOSIS — N183 Chronic kidney disease, stage 3 unspecified: Secondary | ICD-10-CM

## 2014-11-27 DIAGNOSIS — Z794 Long term (current) use of insulin: Secondary | ICD-10-CM

## 2014-11-27 LAB — COMPLETE METABOLIC PANEL WITH GFR
ALBUMIN: 2.6 g/dL — AB (ref 3.6–5.1)
ALK PHOS: 60 U/L (ref 33–115)
ALT: 10 U/L (ref 6–29)
AST: 11 U/L (ref 10–35)
BUN: 53 mg/dL — AB (ref 7–25)
CO2: 20 mmol/L (ref 20–31)
Calcium: 7.7 mg/dL — ABNORMAL LOW (ref 8.6–10.2)
Chloride: 108 mmol/L (ref 98–110)
Creat: 5.45 mg/dL — ABNORMAL HIGH (ref 0.50–1.10)
GFR, Est African American: 10 mL/min — ABNORMAL LOW (ref >=60)
GFR, Est Non African American: 9 mL/min — ABNORMAL LOW (ref >=60)
Glucose, Bld: 98 mg/dL (ref 65–99)
POTASSIUM: 5.5 mmol/L — AB (ref 3.5–5.3)
SODIUM: 137 mmol/L (ref 135–146)
Total Bilirubin: 0.2 mg/dL (ref 0.2–1.2)
Total Protein: 5.4 g/dL — ABNORMAL LOW (ref 6.1–8.1)

## 2014-11-27 LAB — LIPID PANEL
CHOL/HDL RATIO: 3.8 ratio (ref <=5.0)
Cholesterol: 157 mg/dL (ref 125–200)
HDL: 41 mg/dL — ABNORMAL LOW (ref >=46)
LDL Cholesterol: 88 mg/dL (ref <130)
Triglycerides: 138 mg/dL (ref <150)
VLDL: 28 mg/dL (ref <30)

## 2014-11-27 LAB — POCT URINALYSIS DIP (DEVICE)
BILIRUBIN URINE: NEGATIVE
Glucose, UA: 100 mg/dL — AB
Ketones, ur: NEGATIVE mg/dL
LEUKOCYTES UA: NEGATIVE
NITRITE: NEGATIVE
Protein, ur: >=300 mg/dL — AB
Specific Gravity, Urine: >=1.03 (ref 1.005–1.030)
Urobilinogen, UA: 0.2 mg/dL (ref 0.0–1.0)
pH: 5.5 (ref 5.0–8.0)

## 2014-11-27 LAB — HEMOGLOBIN A1C
Hgb A1c MFr Bld: 7.7 % — ABNORMAL HIGH (ref <5.7)
Mean Plasma Glucose: 174 mg/dL — ABNORMAL HIGH (ref <117)

## 2014-11-27 LAB — GLUCOSE, CAPILLARY: GLUCOSE-CAPILLARY: 108 mg/dL — AB (ref 65–99)

## 2014-11-27 NOTE — Progress Notes (Signed)
Subjective:    Patient ID: Jade Bond, female    DOB: 12/16/1965, 49 y.o.   MRN: [id number]  HPI Jade Bond, a 49 year old female presents to establish care. She states that she was a patient of Dr. Dionisio Bond but did not follow up due to insurance constraints. Patient has a history of insulin dependent diabetes. She states that she has not been to endocrinologist since January. She maintains that hemoglobin a1C was 13 at that time. She had not been taking medications consistently until emergency room visit on 11/13/2014. She state that she has been checking blood sugars prior to breakfast and at bedtime. She states that blood sugars have been fluctuating. Symptoms include: polydipsia, polyuria and visual disturbances. Symptoms have been intermittent. Patient denies foot ulcerations, increase appetite, paresthesia of the feet, vomitting and weight loss.   Treatment to date includes Lantus and glipizide.   Jade Bond also has a history of hypertension. She states that she has not taken antihypertensive medication today. She maintains that she does not exercise or follow a low fat diet. She has been inconsistent with taking medications. She reports that she does not smoke, but she has smokers living in her home. She states that she has had chest pains in the past. She currently denies chest pains, heart palpitations, or lower extremity edema.  Past Medical History  Diagnosis Date  . Hypertension   . Hyperlipidemia   . Diabetes mellitus   . Pneumonia 07/04/2013  . CKD (chronic kidney disease), stage III   . Normocytic anemia   . Chest pain     a. 2D echo 01/2010 - mild TR, LVEF 60% with normal wall thickness, trace MR, mild TR, normal RV, trace pericardial effusion. b. Normal nuc 03/2011 at [facility].  . Hypoalbuminemia   . CHF (congestive heart failure) ([place])    Immunization History  Administered Date(s) Administered  . Influenza Split 03/19/2011, 11/24/2011  . Pneumococcal  Polysaccharide-23 03/19/2011  No Known Allergies Social History   Social History  . Marital Status: Legally Separated    Spouse Name: N/A  . Number of Children: N/A  . Years of Education: N/A   Occupational History  .      Food Therapist, nutritional   Social History Main Topics  . Smoking status: Never Smoker   . Smokeless tobacco: Never Used  . Alcohol Use: Yes     Comment: occasional drink of wine - 1 glass/week or less  . Drug Use: No  . Sexual Activity: Not on file   Other Topics Concern  . Not on file   Social History Narrative   Review of Systems  Constitutional: Negative.   HENT: Positive for tinnitus.   Eyes: Positive for visual disturbance (20/50 bilaterally). Negative for photophobia.  Respiratory: Negative for apnea, cough and shortness of breath.   Cardiovascular: Negative.   Gastrointestinal: Negative.   Endocrine: Positive for polydipsia, polyphagia and polyuria.  Genitourinary: Negative.   Musculoskeletal: Negative.   Skin: Negative.   Allergic/Immunologic: Negative.   Neurological: Negative.   Hematological: Negative.   Psychiatric/Behavioral: Negative.  Negative for suicidal ideas.       Objective:   Physical Exam  Constitutional: She is oriented to person, place, and time. She appears well-developed and well-nourished.  HENT:  Head: Normocephalic and atraumatic.  Right Ear: External ear normal.  Left Ear: External ear normal.  Nose: Nose normal.  Mouth/Throat: Oropharynx is clear and moist.  Eyes: Conjunctivae and EOM are normal. Pupils are  equal, round, and reactive to light.  Neck: Normal range of motion. Neck supple.  Cardiovascular: Normal rate, regular rhythm, normal heart sounds and intact distal pulses.   Pulmonary/Chest: Effort normal and breath sounds normal.  Abdominal: Soft. Bowel sounds are normal.  Musculoskeletal: Normal range of motion.  Neurological: She is alert and oriented to person, place, and time. She has normal reflexes.   Skin: Skin is warm, dry and intact. No rash noted.  Hyperpigmentation to face with healing maculopapules.     Psychiatric: She has a normal mood and affect. Her behavior is normal. Judgment and thought content normal.     BP 162/84 mmHg  Pulse 74  Temp(Src) 98.2 F (36.8 C) (Oral)  Resp 16  Ht 5\' 7"  (1.702 m)  Wt 184 lb (83.462 kg)  BMI 28.81 kg/m2  SpO2 100%  LMP 11/22/2014 Assessment & Plan:   1. Essential hypertension Blood pressure is currently above goal on current blood pressure regimen. Patient has not taken medication consistently. Proteinuria present.  Recommend that patient program cell phone reminder to take medication. Follow up for a blood pressure check in 1 week. Make sure that she takes medication prior to blood pressure check.  - Lipid Panel - POCT urinalysis dip (device)  2. Uncontrolled type 2 diabetes mellitus with hyperglycemia, with long-term current use of insulin (HCC) Continue medications as previously prescribed. Patient has not had laboratory testing since June. Will follow up with patient by phone after reviewing laboratory results.  - POCT urinalysis dipstick - COMPLETE METABOLIC PANEL WITH GFR - Glucose (CBG) - Hemoglobin A1c - Glucose, capillary - POCT urinalysis dip (device)  3. CKD (chronic kidney disease) stage 3, GFR 30-59 ml/min Patient has a history of chronic kidney disease. She currently has proteinuria. Will check BUN/Creatine and GNR.   4. Hyperlipidemia Patient was previously on atorvastatin 80 mg daily. Will review lipid panel.  - Lipid Panel  5. Need for immunization against influenza  - Flu Vaccine QUAD 36+ mos IM (Fluarix)  6. Routine Health Maintenance  Will schedule pap smear during follow up appointment Will send referral for mammogram Patient up to date on immunizations Recommend a lowfat, low carbohydrate diet divided over 5-6 small meals, increase water intake to 6-8 glasses, and 150 minutes per week of  cardiovascular exercise.     RTC: Will follow up by phone on 11/28/2014 Dorena Dew, FNP

## 2014-11-28 ENCOUNTER — Telehealth: Payer: Self-pay | Admitting: Family Medicine

## 2014-11-28 DIAGNOSIS — N185 Chronic kidney disease, stage 5: Secondary | ICD-10-CM

## 2014-11-28 NOTE — Telephone Encounter (Signed)
Jade Bond presented on 11/27/2014 to establish care. Reviewed labs, BUN and creatinine elevated, GFR 10. Patient has severe chronic kidney disease. I will send referral to nephrology. Patient will need to schedule a follow up appointment for blood pressure re-check.  Hemoglobin Alc has decreased from 13 to 7.7%, will continue Lantus at 25 units HS. Will discontinue Glipizide due to risk of hypoglycemia in patients with severe chronic kidney disease. Patient to bring glucometer to follow up appointment in 1 month.    Dorena Dew, FNP

## 2014-12-04 ENCOUNTER — Other Ambulatory Visit: Payer: PRIVATE HEALTH INSURANCE

## 2014-12-12 ENCOUNTER — Other Ambulatory Visit: Payer: Self-pay | Admitting: Nephrology

## 2014-12-12 DIAGNOSIS — N184 Chronic kidney disease, stage 4 (severe): Secondary | ICD-10-CM

## 2014-12-19 ENCOUNTER — Other Ambulatory Visit: Payer: Self-pay | Admitting: Nephrology

## 2014-12-19 DIAGNOSIS — N184 Chronic kidney disease, stage 4 (severe): Secondary | ICD-10-CM

## 2014-12-22 ENCOUNTER — Other Ambulatory Visit: Payer: PRIVATE HEALTH INSURANCE

## 2015-01-01 ENCOUNTER — Ambulatory Visit
Admission: RE | Admit: 2015-01-01 | Discharge: 2015-01-01 | Disposition: A | Discharge status: Home / Self Care | Payer: PRIVATE HEALTH INSURANCE | Source: Ambulatory Visit | Attending: Nephrology | Admitting: Nephrology

## 2015-01-01 DIAGNOSIS — N184 Chronic kidney disease, stage 4 (severe): Secondary | ICD-10-CM

## 2015-01-02 ENCOUNTER — Ambulatory Visit (INDEPENDENT_AMBULATORY_CARE_PROVIDER_SITE_OTHER): Payer: PRIVATE HEALTH INSURANCE | Admitting: Family Medicine

## 2015-01-02 VITALS — BP 164/98 | HR 85 | Temp 98.2°F | Resp 16 | Ht 67.0 in | Wt 182.0 lb

## 2015-01-02 DIAGNOSIS — Z794 Long term (current) use of insulin: Secondary | ICD-10-CM

## 2015-01-02 DIAGNOSIS — N185 Chronic kidney disease, stage 5: Secondary | ICD-10-CM

## 2015-01-02 DIAGNOSIS — N926 Irregular menstruation, unspecified: Secondary | ICD-10-CM

## 2015-01-02 DIAGNOSIS — E1165 Type 2 diabetes mellitus with hyperglycemia: Secondary | ICD-10-CM | POA: Diagnosis not present

## 2015-01-02 DIAGNOSIS — I10 Essential (primary) hypertension: Secondary | ICD-10-CM

## 2015-01-02 LAB — BASIC METABOLIC PANEL
BUN: 44 mg/dL — AB (ref 7–25)
CHLORIDE: 106 mmol/L (ref 98–110)
CO2: 24 mmol/L (ref 20–31)
CREATININE: 4.67 mg/dL — AB (ref 0.50–1.10)
Calcium: 8 mg/dL — ABNORMAL LOW (ref 8.6–10.2)
GLUCOSE: 105 mg/dL — AB (ref 65–99)
POTASSIUM: 4.8 mmol/L (ref 3.5–5.3)
Sodium: 135 mmol/L (ref 135–146)

## 2015-01-02 LAB — GLUCOSE, CAPILLARY: GLUCOSE-CAPILLARY: 106 mg/dL — AB (ref 65–99)

## 2015-01-02 MED ORDER — METOPROLOL SUCCINATE ER 50 MG PO TB24: 50.0000 mg | ORAL_TABLET | Freq: Every day | ORAL | Status: DC

## 2015-01-02 MED ORDER — CLONIDINE HCL 0.1 MG PO TABS
0.2000 mg | ORAL_TABLET | Freq: Once | ORAL | Status: AC
  Administered 2015-01-02: 0.2 mg via ORAL

## 2015-01-02 MED ORDER — GLUCOSE BLOOD VI STRP: ORAL_STRIP | Status: DC

## 2015-01-02 NOTE — Progress Notes (Signed)
Subjective:    Patient ID: Jade Bond, female    DOB: 06/09/1965, 49 y.o.   MRN: [id number]  HPI [name], a 49 year old female presents for a follow up of uncontrolled hypertension and diabetes. Jade Bond also has a history of uncontrolled hypertension. She states that she has not taken antihypertensive medication today, she ran out of medications 3 days ago. She maintains that she does not exercise or follow a low fat diet. She has been inconsistent with taking medications. She reports that she does not smoke, but she has smokers living in her home. She states that she has had chest pains in the past. She currently denies chest pains, heart palpitations, or lower extremity edema. Previous urine studies indicated microalbuminuria. Patient has chronic kidney disease and was referred to Dr. Jimmy Bond.   Patient also has a history of type 2 diabetes mellitus. Previous hemoglobin a1C was 7.7%. She has been taking medications consistently and following a low carbohydrate diet. Symptoms have decreased since starting current medication regimen. She does not check blood sugars consistently at home. She maintains that she is out of supplies. Evaluation to date has been included: fasting lipid panel, hemoglobin A1C and microalbuminuria.   Past Medical History  Diagnosis Date  . Hypertension   . Hyperlipidemia   . Diabetes mellitus   . Pneumonia 07/04/2013  . CKD (chronic kidney disease), stage III   . Normocytic anemia   . Chest pain     a. 2D echo 01/2010 - mild TR, LVEF 60% with normal wall thickness, trace MR, mild TR, normal RV, trace pericardial effusion. b. Normal nuc 03/2011 at [facility].  . Hypoalbuminemia   . CHF (congestive heart failure) ([place])    Immunization History  Administered Date(s) Administered  . Influenza Split 03/19/2011, 11/24/2011  . Influenza,inj,Quad PF,36+ Mos 11/27/2014  . Pneumococcal Polysaccharide-23 03/19/2011  No Known Allergies Social History   Social History   . Marital Status: Legally Separated    Spouse Name: N/A  . Number of Children: N/A  . Years of Education: N/A   Occupational History  .      Food Therapist, nutritional   Social History Main Topics  . Smoking status: Never Smoker   . Smokeless tobacco: Never Used  . Alcohol Use: Yes     Comment: occasional drink of wine - 1 glass/week or less  . Drug Use: No  . Sexual Activity: Not on file   Other Topics Concern  . Not on file   Social History Narrative   Review of Systems  Constitutional: Negative.   Eyes: Positive for visual disturbance (20/50 bilaterally). Negative for photophobia.  Respiratory: Negative for apnea and cough.   Cardiovascular: Negative.   Gastrointestinal: Negative.   Endocrine: Positive for polydipsia.  Genitourinary: Negative.   Musculoskeletal: Negative.   Skin: Negative.   Allergic/Immunologic: Negative.   Neurological: Negative.   Hematological: Negative.   Psychiatric/Behavioral: Negative.  Negative for suicidal ideas.       Objective:   Physical Exam  Constitutional: She is oriented to person, place, and time. She appears well-developed and well-nourished.  HENT:  Head: Normocephalic and atraumatic.  Right Ear: External ear normal.  Left Ear: External ear normal.  Nose: Nose normal.  Mouth/Throat: Oropharynx is clear and moist.  Eyes: Conjunctivae and EOM are normal. Pupils are equal, round, and reactive to light.  Neck: Normal range of motion. Neck supple.  Cardiovascular: Normal rate, regular rhythm, normal heart sounds and intact distal pulses.  Pulmonary/Chest: Effort normal and breath sounds normal.  Abdominal: Soft. Bowel sounds are normal.  Musculoskeletal: Normal range of motion.  Neurological: She is alert and oriented to person, place, and time. She has normal reflexes.  Skin: Skin is warm, dry and intact. No rash noted.  Hyperpigmentation to face with healing maculopapules.     Psychiatric: She has a normal mood and affect.  Her behavior is normal. Judgment and thought content normal.     BP 164/98 mmHg  Pulse 85  Temp(Src) 98.2 F (36.8 C) (Oral)  Resp 16  Ht 5\' 7"  (1.702 m)  Wt 182 lb (82.555 kg)  BMI 28.50 kg/m2 Assessment & Plan:    1. Uncontrolled type 2 diabetes mellitus with hyperglycemia, with long-term current use of insulin (HCC)  - Glucose (CBG) - glucose blood (TRUE METRIX BLOOD GLUCOSE TEST) test strip; Use as instructed  Dispense: 100 each; Refill: 12  2. Menstrual irregularity Patient reports periodic hot flashes and menstrual irregularities. She reports that mentrual cycle is sporadic. I suspect that patient is perimenopausal. Will check hormone levels.  - FSH/LH  3. Essential hypertension Blood pressure was 198/100 manually upon arrival. Patient asymptomatic. Patient reports that she ran out of medications 3 days ago. Gave Clonidine 0.2 mg in office. BP decreased to 164/98. Will send antihypertensive medication to pharmacy. Discussed the importance of taking antihypertensive medication consistently.  - Basic Metabolic Panel - metoprolol succinate (TOPROL-XL) 50 MG 24 hr tablet; Take 1 tablet (50 mg total) by mouth daily. Take with or immediately following a meal.  Dispense: 90 tablet; Refill: 1 - cloNIDine (CATAPRES) tablet 0.2 mg; Take 2 tablets (0.2 mg total) by mouth once.  4. CKD (chronic kidney disease) stage 5, GFR less than 15 ml/min (HCC) Patient is to follow up with nephrology as scheduled. Will review notes as they become available.    Routine Health Maintenance  Will schedule pap smear during follow up appointment Will send referral for mammogram Patient up to date on immunizations Recommend a lowfat, low carbohydrate diet divided over 5-6 small meals, increase water intake to 6-8 glasses, and 150 minutes per week of cardiovascular exercise.     RTC: Will follow up by phone with laboratory results 1 month for hypertension follow-up Dorena Dew, FNP

## 2015-01-03 ENCOUNTER — Encounter: Payer: Self-pay | Admitting: Internal Medicine

## 2015-01-03 LAB — FSH/LH
FSH: 21 m[IU]/mL
LH: 25.1 m[IU]/mL

## 2015-01-05 ENCOUNTER — Encounter: Payer: Self-pay | Admitting: Family Medicine

## 2015-01-15 LAB — HM DIABETES EYE EXAM

## 2015-01-22 ENCOUNTER — Other Ambulatory Visit: Payer: Self-pay

## 2015-01-22 DIAGNOSIS — I10 Essential (primary) hypertension: Secondary | ICD-10-CM

## 2015-01-22 MED ORDER — METOPROLOL SUCCINATE ER 50 MG PO TB24: 50.0000 mg | ORAL_TABLET | Freq: Every day | ORAL | Status: DC

## 2015-01-22 MED ORDER — GLIPIZIDE 10 MG PO TABS: 10.0000 mg | ORAL_TABLET | Freq: Two times a day (BID) | ORAL | Status: DC

## 2015-01-22 NOTE — Telephone Encounter (Signed)
Refills for metoprolol and glipizide sent to pharmacy. Thanks!

## 2015-02-05 ENCOUNTER — Other Ambulatory Visit: Payer: Self-pay | Admitting: *Deleted

## 2015-02-05 DIAGNOSIS — Z01810 Encounter for preprocedural cardiovascular examination: Secondary | ICD-10-CM

## 2015-02-05 DIAGNOSIS — N185 Chronic kidney disease, stage 5: Secondary | ICD-10-CM

## 2015-02-06 ENCOUNTER — Ambulatory Visit (INDEPENDENT_AMBULATORY_CARE_PROVIDER_SITE_OTHER): Payer: PRIVATE HEALTH INSURANCE | Admitting: Family Medicine

## 2015-02-06 ENCOUNTER — Telehealth: Payer: Self-pay | Admitting: Family Medicine

## 2015-02-06 VITALS — BP 172/96 | HR 84 | Temp 98.3°F | Resp 16 | Ht 67.0 in | Wt 183.0 lb

## 2015-02-06 DIAGNOSIS — I10 Essential (primary) hypertension: Secondary | ICD-10-CM | POA: Diagnosis not present

## 2015-02-06 DIAGNOSIS — E1165 Type 2 diabetes mellitus with hyperglycemia: Secondary | ICD-10-CM | POA: Diagnosis not present

## 2015-02-06 DIAGNOSIS — N185 Chronic kidney disease, stage 5: Secondary | ICD-10-CM | POA: Diagnosis not present

## 2015-02-06 DIAGNOSIS — Z794 Long term (current) use of insulin: Secondary | ICD-10-CM

## 2015-02-06 DIAGNOSIS — M545 Low back pain, unspecified: Secondary | ICD-10-CM

## 2015-02-06 MED ORDER — ACETAMINOPHEN-CODEINE #3 300-30 MG PO TABS: 1.0000 | ORAL_TABLET | Freq: Four times a day (QID) | ORAL | Status: DC | PRN

## 2015-02-06 MED ORDER — INSULIN GLARGINE 100 UNIT/ML SOLOSTAR PEN: 10.0000 [IU] | PEN_INJECTOR | Freq: Every day | SUBCUTANEOUS | Status: DC

## 2015-02-06 MED ORDER — METOPROLOL SUCCINATE ER 100 MG PO TB24: 100.0000 mg | ORAL_TABLET | Freq: Every day | ORAL | Status: DC

## 2015-02-06 NOTE — Telephone Encounter (Signed)
[place], Please see patient's message, she states she forgot to mention this when she was at appointment today. Please advise. Thanks!

## 2015-02-06 NOTE — Telephone Encounter (Signed)
Patient called stating she no longer takes 25 units of insulin at night because it causes her morning levels to be under 50, lightheaded and shaking. Without taking the insulin at night the morning levels run between 100-110. Please advise how she should take insulin at night. Please call to advise.

## 2015-02-06 NOTE — Telephone Encounter (Signed)
Will reduce Lantus to 10 units HS. Patient will bring glucometer to follow up appointment.   Meds ordered this encounter  Medications  . Insulin Glargine (LANTUS SOLOSTAR) 100 UNIT/ML Solostar Pen    Sig: Inject 10 Units into the skin daily at 10 pm.    Dispense:  5 pen    Refill:  2     Lona Six M, FNP

## 2015-02-06 NOTE — Progress Notes (Signed)
Subjective:    Patient ID: Jade Bond, female    DOB: 1966/01/13, 50 y.o.   MRN: [id number]  Hypertension   Ms. Lajada Munir, a 50 year old female presents for a follow up of uncontrolled hypertension and diabetes. Ms. Mckissick also has a history of uncontrolled hypertension. She states that she has taken antihypertensive medication today. She maintains that she does not exercise or follow a low fat diet. She has been inconsistent with taking medications. She reports that she does not smoke, but she has smokers living in her home. She states that she has had chest pains in the past. She currently denies chest pains, heart palpitations, or lower extremity edema. Previous urine studies indicated microalbuminuria. Patient has chronic kidney disease was seen Dr. Jimmy Footman 1 week ago and hand labs done.   Patient also has a history of type 2 diabetes mellitus. Previous hemoglobin a1C was 7.7%. She has been taking medications consistently and following a low carbohydrate diet. Symptoms have decreased since starting current medication regimen. She has been checking blood sugars consistently at home. She maintains that am blood sugars have been running low over the past several weeks. The lowest fasting blood sugar was 50. She maintains that she is out of supplies. Evaluation to date has been included: fasting lipid panel, hemoglobin A1C and microalbuminuria.   Patient is also complaining of low back pain. She states that current pain intensity is 3-4/10 and is worsening at night when laying down. She denies injury, fatigue, fever, dysuria, incontinence of urine and stool.  Past Medical History  Diagnosis Date  . Hypertension   . Hyperlipidemia   . Diabetes mellitus   . Pneumonia 07/04/2013  . CKD (chronic kidney disease), stage III   . Normocytic anemia   . Chest pain     a. 2D echo 01/2010 - mild TR, LVEF 60% with normal wall thickness, trace MR, mild TR, normal RV, trace pericardial effusion. b. Normal  nuc 03/2011 at [facility].  . Hypoalbuminemia   . CHF (congestive heart failure) ([place])    Immunization History  Administered Date(s) Administered  . Influenza Split 03/19/2011, 11/24/2011  . Influenza,inj,Quad PF,36+ Mos 11/27/2014  . Pneumococcal Polysaccharide-23 03/19/2011  No Known Allergies Social History   Social History  . Marital Status: Legally Separated    Spouse Name: N/A  . Number of Children: N/A  . Years of Education: N/A   Occupational History  .      Food Therapist, nutritional   Social History Main Topics  . Smoking status: Never Smoker   . Smokeless tobacco: Never Used  . Alcohol Use: Yes     Comment: occasional drink of wine - 1 glass/week or less  . Drug Use: No  . Sexual Activity: Not on file   Other Topics Concern  . Not on file   Social History Narrative   Review of Systems  Constitutional: Negative.   Eyes: Negative for photophobia.  Respiratory: Negative for apnea and cough.   Cardiovascular: Negative.   Gastrointestinal: Negative.   Endocrine: Negative for polydipsia, polyphagia and polyuria.  Genitourinary: Negative.   Musculoskeletal: Positive for back pain.  Skin: Negative.   Allergic/Immunologic: Negative.   Neurological: Negative.   Hematological: Negative.   Psychiatric/Behavioral: Negative.  Negative for suicidal ideas.       Objective:   Physical Exam  Constitutional: She is oriented to person, place, and time. She appears well-developed and well-nourished.  HENT:  Head: Normocephalic and atraumatic.  Right Ear: External ear  normal.  Left Ear: External ear normal.  Nose: Nose normal.  Mouth/Throat: Oropharynx is clear and moist.  Eyes: Conjunctivae and EOM are normal. Pupils are equal, round, and reactive to light.  Neck: Normal range of motion. Neck supple.  Cardiovascular: Normal rate, regular rhythm, normal heart sounds and intact distal pulses.   Pulmonary/Chest: Effort normal and breath sounds normal.  Abdominal: Soft. Bowel  sounds are normal.  Musculoskeletal:       Lumbar back: She exhibits decreased range of motion and pain.  Neurological: She is alert and oriented to person, place, and time. She has normal reflexes.  Skin: Skin is warm, dry and intact. No rash noted.  Hyperpigmentation to face with healing maculopapules.     Psychiatric: She has a normal mood and affect. Her behavior is normal. Judgment and thought content normal.     BP 172/96 mmHg  Pulse 84  Temp(Src) 98.3 F (36.8 C) (Oral)  Resp 16  Ht 5\' 7"  (1.702 m)  Wt 183 lb (83.008 kg)  BMI 28.66 kg/m2  LMP 01/15/2015 Assessment & Plan:  1. Essential hypertension Blood pressure  Is not at gola on current medication regimen. Will increase Metoprolol to 100 mg daily. Will follow up for hypertension in 1 month.  - metoprolol succinate (TOPROL-XL) 100 MG 24 hr tablet; Take 1 tablet (100 mg total) by mouth daily. Take with or immediately following a meal.  Dispense: 30 tablet; Refill: 5  2. Left-sided low back pain without sciatica Patient is complaining of low back pain. She has been taking Tylenol with minimal relief. Advised patient not to take Ibuprofen or Aleve due to CKD, she expressed understanding.  - acetaminophen-codeine (TYLENOL #3) 300-30 MG tablet; Take 1 tablet by mouth every 6 (six) hours as needed for moderate pain.  Dispense: 30 tablet; Refill: 0  3. CKD (chronic kidney disease) stage 5, GFR less than 15 ml/min (HCC) Continue to follow with [facility], Dr. Jimmy Footman as schedule. Will call office for a copy of labs to upload to her chart .    4. Uncontrolled type 2 diabetes mellitus with hyperglycemia, with long-term current use of insulin (HCC) Will decrease Lantus to 10 units HS from 25 units HS due to hypoglycemia. Patient is to bring glucometer to next appointment. Will also check hemoglobin A1C at next office visit.   RTC: 1 month for hypertension follow-up  Hollis,Lachina M, FNP   The patient was given clear  instructions to go to ER or return to medical center if symptoms do not improve, worsen or new problems develop. The patient verbalized understanding. Will notify patient with laboratory results.

## 2015-02-07 ENCOUNTER — Encounter: Payer: Self-pay | Admitting: Family Medicine

## 2015-02-11 ENCOUNTER — Encounter (HOSPITAL_COMMUNITY): Payer: PRIVATE HEALTH INSURANCE

## 2015-02-11 ENCOUNTER — Ambulatory Visit: Payer: PRIVATE HEALTH INSURANCE | Admitting: Vascular Surgery

## 2015-02-11 ENCOUNTER — Other Ambulatory Visit (HOSPITAL_COMMUNITY): Payer: PRIVATE HEALTH INSURANCE

## 2015-02-13 ENCOUNTER — Encounter: Payer: Self-pay | Admitting: Vascular Surgery

## 2015-02-19 ENCOUNTER — Encounter: Payer: Self-pay | Admitting: Vascular Surgery

## 2015-02-19 ENCOUNTER — Ambulatory Visit (INDEPENDENT_AMBULATORY_CARE_PROVIDER_SITE_OTHER): Payer: PRIVATE HEALTH INSURANCE | Admitting: Vascular Surgery

## 2015-02-19 ENCOUNTER — Ambulatory Visit (INDEPENDENT_AMBULATORY_CARE_PROVIDER_SITE_OTHER)
Admission: RE | Admit: 2015-02-19 | Discharge: 2015-02-19 | Disposition: A | Discharge status: Home / Self Care | Payer: PRIVATE HEALTH INSURANCE | Source: Ambulatory Visit | Attending: Vascular Surgery | Admitting: Vascular Surgery

## 2015-02-19 ENCOUNTER — Ambulatory Visit (HOSPITAL_COMMUNITY)
Admission: RE | Admit: 2015-02-19 | Discharge: 2015-02-19 | Disposition: A | Discharge status: Home / Self Care | Payer: PRIVATE HEALTH INSURANCE | Source: Ambulatory Visit | Attending: Vascular Surgery | Admitting: Vascular Surgery

## 2015-02-19 VITALS — BP 198/88 | HR 63 | Temp 97.7°F | Resp 16 | Ht 67.0 in | Wt 187.0 lb

## 2015-02-19 DIAGNOSIS — Z01810 Encounter for preprocedural cardiovascular examination: Secondary | ICD-10-CM | POA: Diagnosis not present

## 2015-02-19 DIAGNOSIS — N185 Chronic kidney disease, stage 5: Secondary | ICD-10-CM | POA: Insufficient documentation

## 2015-02-19 DIAGNOSIS — N184 Chronic kidney disease, stage 4 (severe): Secondary | ICD-10-CM

## 2015-02-19 MED FILL — ACETAMINOPHEN/COD #3 TABLET: 300-30 | 7 days supply | Qty: 30 | Fill #0

## 2015-02-19 NOTE — Progress Notes (Signed)
Filed Vitals:   02/19/15 0911 02/19/15 0917  BP: 199/98 198/88  Pulse: 61 63  Temp: 97.7 F (36.5 C)   TempSrc: Oral   Resp: 16   Height: 5\' 7"  (1.702 m)   Weight: 187 lb (84.823 kg)   SpO2: 100%

## 2015-02-19 NOTE — Progress Notes (Signed)
[facility] [place] HISTORY AND PHYSICAL   Referring Physician: Dr. Jimmy Footman History of Present Illness:  Patient is a 50 y.o. female who presents for evaluation for hemodialysis access. She is not currently on hemodialysis. Her renal failure is felt to be secondary to diabetes. She has not had any prior access procedures. She is right-handed. She does know about hemodialysis access from prior experience with her mother. Other medical problems include hypertension, hyperlipidemia and diabetes all of which are currently controlled  Past Medical History  Diagnosis Date  . Hypertension   . Hyperlipidemia   . Diabetes mellitus   . Pneumonia 07/04/2013  . CKD (chronic kidney disease), stage III   . Normocytic anemia   . Chest pain     a. 2D echo 01/2010 - mild TR, LVEF 60% with normal wall thickness, trace MR, mild TR, normal RV, trace pericardial effusion. b. [facility] nuc 03/2011 at [facility].  . Hypoalbuminemia   . CHF (congestive heart failure) [facility])     Past Surgical History  Procedure Laterality Date  . Tonsillectomy  1980  . Cardiac catheterization  ~ 2008    negative, no intervention needed  . Tubal ligation  1995    Social History Social History  Substance Use Topics  . Smoking status: Never Smoker   . Smokeless tobacco: Never Used  . Alcohol Use: Yes     Comment: occasional drink of wine - 1 glass/week or less    Family History Family History  Problem Relation Age of Onset  . Malignant hyperthermia Mother   . Diabetes Mellitus II Mother   . Heart attack Father     Age 91  . Kidney disease Father   . Diabetes Mellitus II Sister   . Hypertension Sister     Allergies  No Known Allergies   Current Outpatient Prescriptions  Medication Sig Dispense Refill  . acetaminophen (TYLENOL) 500 MG tablet Take 2 tablets (1,000 mg total) by mouth every 6 (six) hours as needed for moderate pain. 30 tablet 0  . acetaminophen-codeine (TYLENOL #3) 300-30 MG tablet  Take 1 tablet by mouth every 6 (six) hours as needed for moderate pain. 30 tablet 0  . atorvastatin (LIPITOR) 80 MG tablet Take 1 tablet (80 mg total) by mouth daily. 30 tablet 1  . blood glucose meter kit and supplies KIT Dispense based on patient and insurance preference. Use up to four times daily as directed. (FOR ICD-9 250.00, 250.01). 1 each 0  . calcitRIOL (ROCALTROL) 0.25 MCG capsule Take 0.25 mcg by mouth daily.    . calcium acetate (PHOSLO) 667 MG capsule Take 667 mg by mouth 3 (three) times daily with meals.    Marland Kitchen glipiZIDE (GLUCOTROL) 10 MG tablet Take 1 tablet (10 mg total) by mouth 2 (two) times daily before a meal. 60 tablet 3  . glucose blood (TRUE METRIX BLOOD GLUCOSE TEST) test strip Use as instructed 100 each 12  . Insulin Glargine (LANTUS SOLOSTAR) 100 UNIT/ML Solostar Pen Inject 10 Units into the skin daily at 10 pm. 5 pen 2  . Insulin Pen Needle (BD PEN NEEDLE NANO U/F) 32G X 4 MM MISC Use 1x a day 100 each 3  . metoprolol succinate (TOPROL-XL) 100 MG 24 hr tablet Take 1 tablet (100 mg total) by mouth daily. Take with or immediately following a meal. 30 tablet 5  . nitroGLYCERIN (NITROSTAT) 0.4 MG SL tablet Place 1 tablet (0.4 mg total) under the tongue every 5 (five) minutes as  needed. For chest pain 30 tablet 0  . sodium bicarbonate 650 MG tablet Take 1,300 mg by mouth 2 (two) times daily.     No current facility-administered medications for this visit.    ROS:   General:  No weight loss, Fever, chills  HEENT: No recent headaches, no nasal bleeding, no visual changes, no sore throat  Neurologic: No dizziness, blackouts, seizures. No recent symptoms of stroke or mini- stroke. No recent episodes of slurred speech, or temporary blindness.  Cardiac: No recent episodes of chest pain/pressure, no shortness of breath at rest.  No shortness of breath with exertion.  Denies history of atrial fibrillation or irregular heartbeat  Vascular: No history of rest pain in feet.  No  history of claudication.  No history of non-healing ulcer, No history of DVT   Pulmonary: No home oxygen, no productive cough, no hemoptysis,  No asthma or wheezing  Musculoskeletal:  _0  Arthritis, _1  Low back pain,  _2  Joint pain  Hematologic:No history of hypercoagulable state.  No history of easy bleeding.  No history of anemia  Gastrointestinal: No hematochezia or melena,  No gastroesophageal reflux, no trouble swallowing  Urinary: [x ] chronic Kidney disease, _3  on HD - _4  MWF or _5  TTHS, _6  Burning with urination, _7  Frequent urination, _8  Difficulty urinating;   Skin: No rashes  Psychological: No history of anxiety,  No history of depression   Physical Examination  Filed Vitals:   02/19/15 0911 02/19/15 0917  BP: 199/98 198/88  Pulse: 61 63  Temp: 97.7 F (36.5 C)   TempSrc: Oral   Resp: 16   Height: _9  (1.702 m)   Weight: 187 lb (84.823 kg)   SpO2: 100%     Body mass index is 29.28 kg/(m^2).  General:  Alert and oriented, no acute distress HEENT: Normal Neck: No bruit or JVD Pulmonary: Clear to auscultation bilaterally Cardiac: Regular Rate and Rhythm without murmur Abdomen: Soft, non-tender, non-distended, no mass Skin: No rash Extremity Pulses:  2+ radial, brachial pulses bilaterally Musculoskeletal: No deformity or edema  Neurologic: Upper and lower extremity motor 5/5 and symmetric  DATA:  Patient had an arterial and venous duplex of her upper extremities today. She has normal brachial artery pattern with bifurcation at the antecubital level. Her cephalic vein is small bilaterally. She does have a basilic vein which is greater than 3 mm bilaterally   ASSESSMENT:  Patient needs long-term hemodialysis access.   PLAN:  I offered the patient a left basilic vein transposition fistula today. However, the patient is struggling to deal with the fact that she is going to need dialysis in the future. She was very tearful and anxious during the office  visit today. She did not wish to schedule an access procedures today. She will call back if she wishes to schedule this at some point in the future.  Ruta Hinds, MD [facility] [place] Office: [phone] Pager: [phone]

## 2015-02-20 ENCOUNTER — Other Ambulatory Visit: Payer: Self-pay

## 2015-02-24 MED FILL — METOPROLOL SUCC ER 50 MG TA: 50 | 30 days supply | Qty: 30 | Fill #1

## 2015-02-27 ENCOUNTER — Encounter (HOSPITAL_COMMUNITY): Payer: Self-pay | Admitting: *Deleted

## 2015-02-27 NOTE — Progress Notes (Signed)
Anesthesia Chart Review: SAME DAY WORK-UP.  Patient is a 50 year old female scheduled for left BVT on 03/04/15 by Dr. Oneida Alar.  History includes CKD (not yet on HD), HTN, DM2, non-smoker, anemia, HLD, CP '12, CHF. PCP is Dr. Dellia Nims. Nephrologist is Dr. Jimmy Footman.  She was seen as a new cardiology patient by Melina Copa, PA-C/Dr. Dorris Carnes on 11/30/13 for edema and exertional chest pain. Reported no significant findings on cath around 2008 [facility]?). Stress and echo done (see below). Following results, Dr. Harrington Challenger stated that she would not make any changes in meds at that time, and to continue to follow BP and fluid status closely.  Meds include ASA 81mg , Phoslo, calcitriol, glipizide, Lantus, Toprol SL, Nitro.  BP at [facility] on 02/19/15 was 199/98 and 198/88.  09/30/14 EKG: ST at 109 bpm, cannot rule out anterior infarct, age undetermined.   12/05/13 Nuclear stress test: Overall Impression: Normal stress nuclear study. LV Ejection Fraction: 53%. LV Wall Motion: mild inferior hypokinesis. Consider echo to further define wall motion and LVEF  12/05/13 Echo: Study Conclusions - Left ventricle: The cavity size was normal. There was moderate concentric hypertrophy. Systolic function was normal. The estimated ejection fraction was in the range of 60% to 65%. There was dynamic obstruction during Valsalvain the mid cavity, with mid-cavity obliteration, a peak velocity of 263 cm/sec, and a peak gradient of 28 mm Hg. Wall motion was normal; there were no regional wall motion abnormalities. There was an increased relative contribution of atrial contraction to ventricular filling. Doppler parameters are consistent with abnormal left ventricular relaxation (grade 1 diastolic dysfunction). - Aortic valve: Trileaflet; normal thickness, mildly calcified leaflets. - Mitral valve: Mildly calcified annulus. There was trivial regurgitation. - Pulmonic valve: There was trivial  regurgitation.  01/01/15 Renal U/S: IMPRESSION: There is no evidence of renal artery stenosis. There is increased cortical echogenicity within the kidneys compatible with medical renal parenchymal disease.  09/30/14 CXR: IMPRESSION: No active cardiopulmonary disease.  She is for labs on arrival.   She will get her BP rechecked on arrival. We would like her to take Toprol on the day of surgery.    If BP and same day labs are acceptable then I would anticipate that she could proceed as planned.  George Hugh [facility] Short Stay Center/Anesthesiology Phone [phone] 02/27/2015 11:39 AM

## 2015-02-27 NOTE — Progress Notes (Signed)
Pt denies SOB, chest pain, and being under the care of a cardiologist. Pt stated that her fasting blood sugar generally " runs between 80-100's"  once HS insulin was decreased. Pt made aware to only take morning and lunch dose of Glipizide the day prior to procedure and none DOS. Pt also made aware to take 8 units ( 80%) of Lantus Insulin the night prior to procedure. Pt made aware to check BS every 1-2 hours the morning of procedure and instructed on diabetes protocol which includes how to treat a BS< 70 and > 220. Pt verbalized understanding of all pre-op instructions. Pt chart forwarded to [place], [place], Anesthesia for review; pt instructed to make sure that she takes her Metoprolol the morning of surgery as advised by Ebony Hail, [place].

## 2015-03-03 MED ORDER — DEXTROSE 5 % IV SOLN
1.5000 g | INTRAVENOUS | Status: AC
  Administered 2015-03-04: 1.5 g via INTRAVENOUS
  Filled 2015-03-03 (×2): qty 1.5

## 2015-03-03 MED ORDER — CHLORHEXIDINE GLUCONATE CLOTH 2 % EX PADS: 6.0000 | MEDICATED_PAD | Freq: Once | CUTANEOUS | Status: DC

## 2015-03-03 MED ORDER — SODIUM CHLORIDE 0.9 % IV SOLN
INTRAVENOUS | Status: DC
  Administered 2015-03-04: 08:00:00 via INTRAVENOUS

## 2015-03-03 NOTE — Anesthesia Preprocedure Evaluation (Addendum)
Anesthesia Evaluation  Patient identified by MRN, date of birth, ID band Patient awake    Reviewed: Allergy & Precautions, H&P , NPO status , Patient's Chart, lab work & pertinent test results, reviewed documented beta blocker date and time   Airway Mallampati: II  TM Distance: >3 FB Neck ROM: Full    Dental no notable dental hx. (+) Teeth Intact, Dental Advisory Given   Pulmonary neg pulmonary ROS,    Pulmonary exam normal breath sounds clear to auscultation       Cardiovascular hypertension, Pt. on medications and Pt. on home beta blockers +CHF  + Valvular Problems/Murmurs  Rhythm:Regular Rate:Normal     Neuro/Psych negative neurological ROS  negative psych ROS   GI/Hepatic Neg liver ROS, GERD  Controlled,  Endo/Other  diabetes, Insulin Dependent  Renal/GU Renal InsufficiencyRenal disease  negative genitourinary   Musculoskeletal   Abdominal   Peds  Hematology negative hematology ROS (+)   Anesthesia Other Findings   Reproductive/Obstetrics negative OB ROS                          Anesthesia Physical Anesthesia Plan  ASA: III  Anesthesia Plan: General   Post-op Pain Management:    Induction: Intravenous  Airway Management Planned: LMA  Additional Equipment:   Intra-op Plan:   Post-operative Plan: Extubation in OR  Informed Consent: I have reviewed the patients History and Physical, chart, labs and discussed the procedure including the risks, benefits and alternatives for the proposed anesthesia with the patient or authorized representative who has indicated his/her understanding and acceptance.   Dental advisory given  Plan Discussed with: CRNA  Anesthesia Plan Comments:         Anesthesia Quick Evaluation

## 2015-03-04 ENCOUNTER — Ambulatory Visit (HOSPITAL_COMMUNITY): Payer: Medicaid Other | Admitting: Vascular Surgery

## 2015-03-04 ENCOUNTER — Encounter (HOSPITAL_COMMUNITY): Payer: Self-pay | Admitting: Certified Registered Nurse Anesthetist

## 2015-03-04 ENCOUNTER — Other Ambulatory Visit: Payer: Self-pay | Admitting: *Deleted

## 2015-03-04 ENCOUNTER — Encounter (HOSPITAL_COMMUNITY)
Admission: RE | Disposition: A | Discharge status: Home / Self Care | Payer: Self-pay | Source: Ambulatory Visit | Attending: Vascular Surgery

## 2015-03-04 ENCOUNTER — Ambulatory Visit (HOSPITAL_COMMUNITY)
Admission: RE | Admit: 2015-03-04 | Discharge: 2015-03-04 | Disposition: A | Discharge status: Home / Self Care | Payer: Medicaid Other | Source: Ambulatory Visit | Attending: Vascular Surgery | Admitting: Vascular Surgery

## 2015-03-04 DIAGNOSIS — N185 Chronic kidney disease, stage 5: Secondary | ICD-10-CM | POA: Diagnosis not present

## 2015-03-04 DIAGNOSIS — Z4931 Encounter for adequacy testing for hemodialysis: Secondary | ICD-10-CM

## 2015-03-04 DIAGNOSIS — E785 Hyperlipidemia, unspecified: Secondary | ICD-10-CM | POA: Insufficient documentation

## 2015-03-04 DIAGNOSIS — Z794 Long term (current) use of insulin: Secondary | ICD-10-CM | POA: Diagnosis not present

## 2015-03-04 DIAGNOSIS — I120 Hypertensive chronic kidney disease with stage 5 chronic kidney disease or end stage renal disease: Secondary | ICD-10-CM | POA: Diagnosis not present

## 2015-03-04 DIAGNOSIS — I129 Hypertensive chronic kidney disease with stage 1 through stage 4 chronic kidney disease, or unspecified chronic kidney disease: Secondary | ICD-10-CM | POA: Diagnosis present

## 2015-03-04 DIAGNOSIS — Z992 Dependence on renal dialysis: Secondary | ICD-10-CM | POA: Diagnosis not present

## 2015-03-04 DIAGNOSIS — N186 End stage renal disease: Secondary | ICD-10-CM | POA: Diagnosis not present

## 2015-03-04 DIAGNOSIS — E1122 Type 2 diabetes mellitus with diabetic chronic kidney disease: Secondary | ICD-10-CM | POA: Insufficient documentation

## 2015-03-04 HISTORY — PX: BASCILIC VEIN TRANSPOSITION: SHX5742

## 2015-03-04 HISTORY — DX: Gastro-esophageal reflux disease without esophagitis: K21.9

## 2015-03-04 HISTORY — DX: Type 2 diabetes mellitus with unspecified diabetic retinopathy without macular edema: E11.319

## 2015-03-04 HISTORY — DX: Cardiac murmur, unspecified: R01.1

## 2015-03-04 LAB — GLUCOSE, CAPILLARY
GLUCOSE-CAPILLARY: 85 mg/dL (ref 65–99)
GLUCOSE-CAPILLARY: 117 mg/dL — AB (ref 65–99)
Glucose-Capillary: 64 mg/dL — ABNORMAL LOW (ref 65–99)
Glucose-Capillary: 60 mg/dL — ABNORMAL LOW (ref 65–99)

## 2015-03-04 LAB — POCT I-STAT 4, (NA,K, GLUC, HGB,HCT)
Glucose, Bld: 70 mg/dL (ref 65–99)
HEMATOCRIT: 26 % — AB (ref 36.0–46.0)
Hemoglobin: 8.8 g/dL — ABNORMAL LOW (ref 12.0–15.0)
POTASSIUM: 4.6 mmol/L (ref 3.5–5.1)
SODIUM: 140 mmol/L (ref 135–145)

## 2015-03-04 LAB — PREGNANCY, URINE: PREG TEST UR: NEGATIVE

## 2015-03-04 SURGERY — TRANSPOSITION, VEIN, BASILIC
Anesthesia: General | Laterality: Left

## 2015-03-04 MED ORDER — ONDANSETRON HCL 4 MG/2ML IJ SOLN
INTRAMUSCULAR | Status: AC
  Filled 2015-03-04: qty 2

## 2015-03-04 MED ORDER — HEPARIN SODIUM (PORCINE) 1000 UNIT/ML IJ SOLN
INTRAMUSCULAR | Status: DC | PRN
  Administered 2015-03-04: 5000 [IU] via INTRAVENOUS

## 2015-03-04 MED ORDER — HYDROMORPHONE HCL 1 MG/ML IJ SOLN
INTRAMUSCULAR | Status: AC
  Administered 2015-03-04: 0.5 mg via INTRAVENOUS
  Filled 2015-03-04: qty 1

## 2015-03-04 MED ORDER — SODIUM CHLORIDE 0.9 % IV SOLN
INTRAVENOUS | Status: DC | PRN
  Administered 2015-03-04: 09:00:00

## 2015-03-04 MED ORDER — DEXTROSE 50 % IV SOLN
25.0000 mL | Freq: Once | INTRAVENOUS | Status: AC
  Administered 2015-03-04: 08:00:00 via INTRAVENOUS
  Filled 2015-03-04 (×2): qty 50

## 2015-03-04 MED ORDER — LIDOCAINE HCL (CARDIAC) 20 MG/ML IV SOLN
INTRAVENOUS | Status: AC
  Filled 2015-03-04: qty 5

## 2015-03-04 MED ORDER — ROCURONIUM BROMIDE 50 MG/5ML IV SOLN
INTRAVENOUS | Status: AC
  Filled 2015-03-04: qty 1

## 2015-03-04 MED ORDER — MIDAZOLAM HCL 2 MG/2ML IJ SOLN
INTRAMUSCULAR | Status: AC
  Filled 2015-03-04: qty 2

## 2015-03-04 MED ORDER — SUCCINYLCHOLINE CHLORIDE 20 MG/ML IJ SOLN
INTRAMUSCULAR | Status: AC
  Filled 2015-03-04: qty 1

## 2015-03-04 MED ORDER — PROPOFOL 10 MG/ML IV BOLUS
INTRAVENOUS | Status: AC
  Filled 2015-03-04: qty 40

## 2015-03-04 MED ORDER — PHENYLEPHRINE 40 MCG/ML (10ML) SYRINGE FOR IV PUSH (FOR BLOOD PRESSURE SUPPORT)
PREFILLED_SYRINGE | INTRAVENOUS | Status: AC
Start: 2015-03-04 — End: 2015-03-04
  Filled 2015-03-04: qty 10

## 2015-03-04 MED ORDER — OXYCODONE-ACETAMINOPHEN 5-325 MG PO TABS: 1.0000 | ORAL_TABLET | Freq: Four times a day (QID) | ORAL | Status: DC | PRN

## 2015-03-04 MED ORDER — PHENYLEPHRINE 40 MCG/ML (10ML) SYRINGE FOR IV PUSH (FOR BLOOD PRESSURE SUPPORT)
PREFILLED_SYRINGE | INTRAVENOUS | Status: AC
  Filled 2015-03-04: qty 10

## 2015-03-04 MED ORDER — ONDANSETRON HCL 4 MG/2ML IJ SOLN
INTRAMUSCULAR | Status: DC | PRN
  Administered 2015-03-04: 4 mg via INTRAVENOUS

## 2015-03-04 MED ORDER — PHENYLEPHRINE HCL 10 MG/ML IJ SOLN
INTRAMUSCULAR | Status: DC | PRN
  Administered 2015-03-04: 40 ug via INTRAVENOUS
  Administered 2015-03-04: 80 ug via INTRAVENOUS
  Administered 2015-03-04 (×4): 40 ug via INTRAVENOUS
  Administered 2015-03-04 (×2): 80 ug via INTRAVENOUS
  Administered 2015-03-04 (×2): 40 ug via INTRAVENOUS
  Administered 2015-03-04: 80 ug via INTRAVENOUS
  Administered 2015-03-04 (×5): 40 ug via INTRAVENOUS

## 2015-03-04 MED ORDER — 0.9 % SODIUM CHLORIDE (POUR BTL) OPTIME
TOPICAL | Status: DC | PRN
  Administered 2015-03-04: 1000 mL

## 2015-03-04 MED ORDER — FENTANYL CITRATE (PF) 100 MCG/2ML IJ SOLN
INTRAMUSCULAR | Status: DC | PRN
  Administered 2015-03-04: 25 ug via INTRAVENOUS
  Administered 2015-03-04: 50 ug via INTRAVENOUS

## 2015-03-04 MED ORDER — HEPARIN SODIUM (PORCINE) 1000 UNIT/ML IJ SOLN
INTRAMUSCULAR | Status: AC
  Filled 2015-03-04: qty 1

## 2015-03-04 MED ORDER — SODIUM CHLORIDE 0.9 % IV SOLN
INTRAVENOUS | Status: DC | PRN
  Administered 2015-03-04 (×2): via INTRAVENOUS

## 2015-03-04 MED ORDER — THROMBIN 20000 UNITS EX SOLR
CUTANEOUS | Status: AC
  Filled 2015-03-04: qty 20000

## 2015-03-04 MED ORDER — PROPOFOL 10 MG/ML IV BOLUS
INTRAVENOUS | Status: DC | PRN
  Administered 2015-03-04: 150 mg via INTRAVENOUS

## 2015-03-04 MED ORDER — FENTANYL CITRATE (PF) 250 MCG/5ML IJ SOLN
INTRAMUSCULAR | Status: AC
  Filled 2015-03-04: qty 5

## 2015-03-04 MED ORDER — HYDROMORPHONE HCL 1 MG/ML IJ SOLN
0.2500 mg | INTRAMUSCULAR | Status: DC | PRN
  Administered 2015-03-04 (×2): 0.5 mg via INTRAVENOUS

## 2015-03-04 MED ORDER — LIDOCAINE HCL (CARDIAC) 20 MG/ML IV SOLN
INTRAVENOUS | Status: DC | PRN
  Administered 2015-03-04: 60 mg via INTRAVENOUS

## 2015-03-04 MED ORDER — ONDANSETRON HCL 4 MG/2ML IJ SOLN
INTRAMUSCULAR | Status: AC
Start: 2015-03-04 — End: 2015-03-04
  Filled 2015-03-04: qty 2

## 2015-03-04 SURGICAL SUPPLY — 33 items
CANISTER SUCTION 2500CC (MISCELLANEOUS) ×2 IMPLANT
CANNULA VESSEL 3MM 2 BLNT TIP (CANNULA) ×2 IMPLANT
CLIP TI MEDIUM 24 (CLIP) ×2 IMPLANT
CLIP TI WIDE RED SMALL 24 (CLIP) ×2 IMPLANT
COVER PROBE W GEL 5X96 (DRAPES) ×4 IMPLANT
DECANTER SPIKE VIAL GLASS SM (MISCELLANEOUS) ×2 IMPLANT
ELECT REM PT RETURN 9FT ADLT (ELECTROSURGICAL) ×2
ELECTRODE REM PT RTRN 9FT ADLT (ELECTROSURGICAL) ×1 IMPLANT
GEL ULTRASOUND 20GR AQUASONIC (MISCELLANEOUS) IMPLANT
GLOVE BIO SURGEON STRL SZ7.5 (GLOVE) ×2 IMPLANT
GLOVE BIOGEL PI IND STRL 6.5 (GLOVE) ×3 IMPLANT
GLOVE BIOGEL PI INDICATOR 6.5 (GLOVE) ×3
GLOVE SURG SS PI 6.0 STRL IVOR (GLOVE) ×4 IMPLANT
GOWN STRL REUS W/ TWL LRG LVL3 (GOWN DISPOSABLE) ×3 IMPLANT
GOWN STRL REUS W/TWL LRG LVL3 (GOWN DISPOSABLE) ×3
KIT BASIN OR (CUSTOM PROCEDURE TRAY) ×2 IMPLANT
KIT ROOM TURNOVER OR (KITS) ×2 IMPLANT
LIQUID BAND (GAUZE/BANDAGES/DRESSINGS) ×2 IMPLANT
LOOP VESSEL MINI RED (MISCELLANEOUS) IMPLANT
NS IRRIG 1000ML POUR BTL (IV SOLUTION) ×2 IMPLANT
PACK CV ACCESS (CUSTOM PROCEDURE TRAY) ×2 IMPLANT
PAD ARMBOARD 7.5X6 YLW CONV (MISCELLANEOUS) ×4 IMPLANT
SPONGE SURGIFOAM ABS GEL 100 (HEMOSTASIS) IMPLANT
SUT PROLENE 7 0 BV 1 (SUTURE) ×6 IMPLANT
SUT SILK 2 0 SH (SUTURE) ×2 IMPLANT
SUT SILK 3 0 (SUTURE) ×2
SUT SILK 3-0 18XBRD TIE 12 (SUTURE) ×2 IMPLANT
SUT VIC AB 3-0 SH 27 (SUTURE) ×5
SUT VIC AB 3-0 SH 27X BRD (SUTURE) ×5 IMPLANT
SUT VICRYL 4-0 PS2 18IN ABS (SUTURE) ×8 IMPLANT
TAPE UMBILICAL COTTON 1/8X30 (MISCELLANEOUS) ×2 IMPLANT
UNDERPAD 30X30 INCONTINENT (UNDERPADS AND DIAPERS) ×2 IMPLANT
WATER STERILE IRR 1000ML POUR (IV SOLUTION) ×2 IMPLANT

## 2015-03-04 NOTE — Op Note (Signed)
Procedure: Left basilic vein transposition fistula  Preoperative diagnosis: End-stage renal disease  Postoperative diagnosis: Same  Anesthesia: Gen.  Assistant: Waldron Labs, RNFA  Operative findings: 3-5 mm left basilic vein, 3 mm left brachial artery  Operative details: After obtaining informed consent, the patient was taken to the operating room. The patient was placed in supine position operating table. After induction of general anesthesia, the patient's entire left upper extremity was prepped and draped in the usual sterile fashion. Next ultrasound was used to identify the left basilic vein. A longitudinal incision was made just above the antecubital crease in order to expose the basilic vein. The vein was of good quality approximately 3-5 mm in diameter throughout its course. Several longitudinal skip incisions were made up the arm from just below the antecubital crease all the way up to the axilla to harvest the basilic vein. Care was taken to try to not injure any sensory nerves and all the motor nerves were identified and protected. The vein was dissected free circumferentially and small side branches ligated and divided between silk ties or clips. Next the brachial artery was exposed by deepening the basilic vein harvest incision just above the antecubital crease. The artery was approximately 3 mm in diameter. This was dissected free circumferentially. Vessel loops were placed around it. There was some spasm within the artery after dissecting it free. Next the distal basilic vein was ligated with a 2 silk tie and the vein transected. The vein was brought out throughout the skip incisions and gently distended with heparinized saline and marked for orientation. The vein was then tunneled subcutaneously in an arcing configuration out over the biceps muscle down to the level of the exposed brachial artery just above the antecubital crease. The patient was given 5000 units of intravenous heparin.  Vessel loops were used to control the artery proximally and distally. The vein was cut to length and sewn end of vein to side of artery using a running 7-0 Prolene suture. Just prior completion of the anastomosis, it was forebled backbled and thoroughly flushed. The anastomosis was secured Vesseloops were released.   Initially the flow was fairly sluggish due to spasm. However there was a palpable thrill after a few minutes of the proximal aspect of the fistula. There was also good Doppler flow throughout the course of the fistula. The distal vein was also noted to fill fully. At this point all subcutaneous tissues were reapproximated using running 3-0 Vicryl suture. All skin incisions were closed with running 4  0 Vicryl subcuticular stitch. Dermabond was applied to all incisions. The patient tolerated the procedure well and there were no complications. Instrument sponge needle counts were correct at the end of the case. The patient was taken to the recovery room in stable condition.  Ruta Hinds, MD [facility] [place] Office: [phone] Pager: [phone]

## 2015-03-04 NOTE — Interval H&P Note (Signed)
History and Physical Interval Note:  03/04/2015 8:13 AM  Jade Bond  has presented today for surgery, with the diagnosis of Stage III Chronic Kidney Disease N18.3  The various methods of treatment have been discussed with the patient and family. After consideration of risks, benefits and other options for treatment, the patient has consented to  Procedure(s): BASILIC VEIN TRANSPOSITION (Left) as a surgical intervention .  The patient's history has been reviewed, patient examined, no change in status, stable for surgery.  I have reviewed the patient's chart and labs.  Questions were answered to the patient's satisfaction.     Ruta Hinds

## 2015-03-04 NOTE — Anesthesia Procedure Notes (Signed)
Procedure Name: LMA Insertion Date/Time: 03/04/2015 8:38 AM Performed by: Garrison [place] T Pre-anesthesia Checklist: Patient identified, Emergency Drugs available, Suction available and Patient being monitored Patient Re-evaluated:Patient Re-evaluated prior to inductionOxygen Delivery Method: Circle system utilized Preoxygenation: Pre-oxygenation with 100% oxygen Intubation Type: IV induction LMA: LMA inserted LMA Size: 4.0 Number of attempts: 1 Placement Confirmation: positive ETCO2 and breath sounds checked- equal and bilateral Tube secured with: Tape Dental Injury: Teeth and Oropharynx as per pre-operative assessment

## 2015-03-04 NOTE — Transfer of Care (Signed)
Immediate Anesthesia Transfer of Care Note  Patient: Jade Bond  Procedure(s) Performed: Procedure(s): BASILIC VEIN TRANSPOSITION (Left)  Patient Location: PACU  Anesthesia Type:General  Level of Consciousness: awake, alert  and oriented  Airway & Oxygen Therapy: Patient Spontanous Breathing  Post-op Assessment: Report given to RN, Post -op Vital signs reviewed and stable and Patient moving all extremities X 4  Post vital signs: Reviewed and stable  Last Vitals:  Filed Vitals:   03/04/15 0649  BP: 214/92  Pulse: 65  Temp: 36.8 C  Resp: 16    Complications: No apparent anesthesia complications

## 2015-03-04 NOTE — H&P (View-Only) (Signed)
[facility] [place] HISTORY AND PHYSICAL   Referring Physician: Dr. Jimmy Footman History of Present Illness:  Patient is a 50 y.o. female who presents for evaluation for hemodialysis access. She is not currently on hemodialysis. Her renal failure is felt to be secondary to diabetes. She has not had any prior access procedures. She is right-handed. She does know about hemodialysis access from prior experience with her mother. Other medical problems include hypertension, hyperlipidemia and diabetes all of which are currently controlled  Past Medical History  Diagnosis Date  . Hypertension   . Hyperlipidemia   . Diabetes mellitus   . Pneumonia 07/04/2013  . CKD (chronic kidney disease), stage III   . Normocytic anemia   . Chest pain     a. 2D echo 01/2010 - mild TR, LVEF 60% with normal wall thickness, trace MR, mild TR, normal RV, trace pericardial effusion. b. Normal nuc 03/2011 at [facility].  . Hypoalbuminemia   . CHF (congestive heart failure) [facility])     Past Surgical History  Procedure Laterality Date  . Tonsillectomy  1980  . Cardiac catheterization  ~ 2008    negative, no intervention needed  . Tubal ligation  1995    Social History Social History  Substance Use Topics  . Smoking status: Never Smoker   . Smokeless tobacco: Never Used  . Alcohol Use: Yes     Comment: occasional drink of wine - 1 glass/week or less    Family History Family History  Problem Relation Age of Onset  . Malignant hyperthermia Mother   . Diabetes Mellitus II Mother   . Heart attack Father     Age 91  . Kidney disease Father   . Diabetes Mellitus II Sister   . Hypertension Sister     Allergies  No Known Allergies   Current Outpatient Prescriptions  Medication Sig Dispense Refill  . acetaminophen (TYLENOL) 500 MG tablet Take 2 tablets (1,000 mg total) by mouth every 6 (six) hours as needed for moderate pain. 30 tablet 0  . acetaminophen-codeine (TYLENOL #3) 300-30 MG tablet  Take 1 tablet by mouth every 6 (six) hours as needed for moderate pain. 30 tablet 0  . atorvastatin (LIPITOR) 80 MG tablet Take 1 tablet (80 mg total) by mouth daily. 30 tablet 1  . blood glucose meter kit and supplies KIT Dispense based on patient and insurance preference. Use up to four times daily as directed. (FOR ICD-9 250.00, 250.01). 1 each 0  . calcitRIOL (ROCALTROL) 0.25 MCG capsule Take 0.25 mcg by mouth daily.    . calcium acetate (PHOSLO) 667 MG capsule Take 667 mg by mouth 3 (three) times daily with meals.    Marland Kitchen glipiZIDE (GLUCOTROL) 10 MG tablet Take 1 tablet (10 mg total) by mouth 2 (two) times daily before a meal. 60 tablet 3  . glucose blood (TRUE METRIX BLOOD GLUCOSE TEST) test strip Use as instructed 100 each 12  . Insulin Glargine (LANTUS SOLOSTAR) 100 UNIT/ML Solostar Pen Inject 10 Units into the skin daily at 10 pm. 5 pen 2  . Insulin Pen Needle (BD PEN NEEDLE NANO U/F) 32G X 4 MM MISC Use 1x a day 100 each 3  . metoprolol succinate (TOPROL-XL) 100 MG 24 hr tablet Take 1 tablet (100 mg total) by mouth daily. Take with or immediately following a meal. 30 tablet 5  . nitroGLYCERIN (NITROSTAT) 0.4 MG SL tablet Place 1 tablet (0.4 mg total) under the tongue every 5 (five) minutes as  needed. For chest pain 30 tablet 0  . sodium bicarbonate 650 MG tablet Take 1,300 mg by mouth 2 (two) times daily.     No current facility-administered medications for this visit.    ROS:   General:  No weight loss, Fever, chills  HEENT: No recent headaches, no nasal bleeding, no visual changes, no sore throat  Neurologic: No dizziness, blackouts, seizures. No recent symptoms of stroke or mini- stroke. No recent episodes of slurred speech, or temporary blindness.  Cardiac: No recent episodes of chest pain/pressure, no shortness of breath at rest.  No shortness of breath with exertion.  Denies history of atrial fibrillation or irregular heartbeat  Vascular: No history of rest pain in feet.  No  history of claudication.  No history of non-healing ulcer, No history of DVT   Pulmonary: No home oxygen, no productive cough, no hemoptysis,  No asthma or wheezing  Musculoskeletal:  _0  Arthritis, _1  Low back pain,  _2  Joint pain  Hematologic:No history of hypercoagulable state.  No history of easy bleeding.  No history of anemia  Gastrointestinal: No hematochezia or melena,  No gastroesophageal reflux, no trouble swallowing  Urinary: [x ] chronic Kidney disease, _3  on HD - _4  MWF or _5  TTHS, _6  Burning with urination, _7  Frequent urination, _8  Difficulty urinating;   Skin: No rashes  Psychological: No history of anxiety,  No history of depression   Physical Examination  Filed Vitals:   02/19/15 0911 02/19/15 0917  BP: 199/98 198/88  Pulse: 61 63  Temp: 97.7 F (36.5 C)   TempSrc: Oral   Resp: 16   Height: _9  (1.702 m)   Weight: 187 lb (84.823 kg)   SpO2: 100%     Body mass index is 29.28 kg/(m^2).  General:  Alert and oriented, no acute distress HEENT: Normal Neck: No bruit or JVD Pulmonary: Clear to auscultation bilaterally Cardiac: Regular Rate and Rhythm without murmur Abdomen: Soft, non-tender, non-distended, no mass Skin: No rash Extremity Pulses:  2+ radial, brachial pulses bilaterally Musculoskeletal: No deformity or edema  Neurologic: Upper and lower extremity motor 5/5 and symmetric  DATA:  Patient had an arterial and venous duplex of her upper extremities today. She has normal brachial artery pattern with bifurcation at the antecubital level. Her cephalic vein is small bilaterally. She does have a basilic vein which is greater than 3 mm bilaterally   ASSESSMENT:  Patient needs long-term hemodialysis access.   PLAN:  I offered the patient a left basilic vein transposition fistula today. However, the patient is struggling to deal with the fact that she is going to need dialysis in the future. She was very tearful and anxious during the office  visit today. She did not wish to schedule an access procedures today. She will call back if she wishes to schedule this at some point in the future.  Ruta Hinds, MD [facility] [place] Office: [phone] Pager: [phone]

## 2015-03-04 NOTE — Anesthesia Postprocedure Evaluation (Signed)
Anesthesia Post Note  Patient: Jade Bond  Procedure(s) Performed: Procedure(s) (LRB): BASILIC VEIN TRANSPOSITION (Left)  Patient location during evaluation: PACU Anesthesia Type: General Level of consciousness: awake and alert Pain management: pain level controlled Vital Signs Assessment: post-procedure vital signs reviewed and stable Respiratory status: spontaneous breathing, nonlabored ventilation and respiratory function stable Cardiovascular status: blood pressure returned to baseline and stable Postop Assessment: no signs of nausea or vomiting Anesthetic complications: no    Last Vitals:  Filed Vitals:   03/04/15 1219 03/04/15 1235  BP: 162/79   Pulse: 62   Temp:  37.5 C  Resp: 14     Last Pain:  Filed Vitals:   03/04/15 1235  PainSc: 4                  Julienne Vogler,W. EDMOND

## 2015-03-05 ENCOUNTER — Encounter (HOSPITAL_COMMUNITY): Payer: Self-pay | Admitting: Vascular Surgery

## 2015-03-05 ENCOUNTER — Telehealth: Payer: Self-pay | Admitting: Vascular Surgery

## 2015-03-05 MED FILL — glipiZIDE 10 MG TABS: 10 | 30 days supply | Qty: 60 | Fill #1

## 2015-03-05 NOTE — Telephone Encounter (Signed)
-----   Message from Mena Goes, RN sent at 03/04/2015  1:03 PM EST ----- Regarding: schedule   ----- Message -----    From: Elam Dutch, MD    Sent: 03/04/2015  11:53 AM      To: V[facility] Charge Pool  Left basilic vein transposition fistula Nurse asst  She needs follow up appt with me in 4-6 weeks with [place] of fistula  Ruta Hinds

## 2015-03-05 NOTE — Telephone Encounter (Signed)
Spoke with pt re appt, dpm  °

## 2015-03-06 ENCOUNTER — Ambulatory Visit (INDEPENDENT_AMBULATORY_CARE_PROVIDER_SITE_OTHER): Payer: PRIVATE HEALTH INSURANCE | Admitting: Family Medicine

## 2015-03-06 VITALS — BP 172/78 | HR 73 | Temp 98.1°F | Resp 16 | Ht 67.0 in | Wt 183.0 lb

## 2015-03-06 DIAGNOSIS — E1165 Type 2 diabetes mellitus with hyperglycemia: Secondary | ICD-10-CM

## 2015-03-06 DIAGNOSIS — K5900 Constipation, unspecified: Secondary | ICD-10-CM | POA: Diagnosis not present

## 2015-03-06 DIAGNOSIS — N185 Chronic kidney disease, stage 5: Secondary | ICD-10-CM

## 2015-03-06 DIAGNOSIS — I10 Essential (primary) hypertension: Secondary | ICD-10-CM

## 2015-03-06 DIAGNOSIS — Z794 Long term (current) use of insulin: Secondary | ICD-10-CM

## 2015-03-06 MED ORDER — METOPROLOL SUCCINATE ER 200 MG PO TB24: 200.0000 mg | ORAL_TABLET | Freq: Every day | ORAL | Status: DC

## 2015-03-06 MED ORDER — DOCUSATE SODIUM 100 MG PO CAPS: 100.0000 mg | ORAL_CAPSULE | Freq: Two times a day (BID) | ORAL | Status: DC

## 2015-03-06 NOTE — Progress Notes (Signed)
Subjective:    Patient ID: Jade Bond, female    DOB: November 10, 1965, 50 y.o.   MRN: [id number]  Hypertension   Jade Bond, a 50 year old female presents for a follow up of uncontrolled hypertension and diabetes. Jade Bond also has a history of uncontrolled hypertension and stage 5 chronic kidney disease. She is followed by Dr. Jeneen Rinks Deterding, nephrologist for end stage kidney disease. She recently had a fistula placed in the left basilic vein on 0000000 by Dr. Ruta Hinds, vascular surgery without complication.  She states that she has taken antihypertensive medication today. She maintains that she does not exercise or follow a low fat diet. She has been consistent with taking medications. She reports that she does not smoke, but she has smokers living in her home. She states that she has had chest pains in the past. She currently denies chest pains, heart palpitations, or lower extremity edema. Previous urine studies indicated microalbuminuria.   Patient also has a history of type 2 diabetes mellitus. Previous hemoglobin a1C was 7.7. She has been taking medications consistently and following a low carbohydrate diet. Symptoms have decreased since starting current medication regimen. She has been checking blood sugars consistently at home. Blood sugars were previously running low, Lantus was lowered to 10 units HS.  Evaluation to date has been included: fasting lipid panel, hemoglobin A1C and microalbuminuria.    Past Medical History  Diagnosis Date  . Hypertension   . Hyperlipidemia   . Diabetes mellitus   . Pneumonia 07/04/2013  . CKD (chronic kidney disease), stage III   . Normocytic anemia   . Chest pain     a. 2D echo 01/2010 - mild TR, LVEF 60% with normal wall thickness, trace MR, mild TR, normal RV, trace pericardial effusion. b. Normal nuc 03/2011 at [facility].  . Hypoalbuminemia   . CHF (congestive heart failure) ([place])   . Heart murmur     as a child  . GERD (gastroesophageal  reflux disease)   . Diabetic retinopathy ([place])    Immunization History  Administered Date(s) Administered  . Influenza Split 03/19/2011, 11/24/2011  . Influenza,inj,Quad PF,36+ Mos 11/27/2014  . Pneumococcal Polysaccharide-23 03/19/2011  No Known Allergies Social History   Social History  . Marital Status: Legally Separated    Spouse Name: N/A  . Number of Children: N/A  . Years of Education: N/A   Occupational History  .      Food Therapist, nutritional   Social History Main Topics  . Smoking status: Never Smoker   . Smokeless tobacco: Never Used  . Alcohol Use: Yes     Comment: occasional drink of wine - 1 glass/week or less  . Drug Use: No  . Sexual Activity: Not on file   Other Topics Concern  . Not on file   Social History Narrative   Review of Systems  Constitutional: Negative.   Eyes: Negative for photophobia.  Respiratory: Negative for apnea and cough.   Cardiovascular: Negative.   Gastrointestinal: Positive for constipation.  Endocrine: Negative for polydipsia, polyphagia and polyuria.  Genitourinary: Negative.   Musculoskeletal: Positive for myalgias (post surgical pain to left upper arm).  Skin: Negative.   Allergic/Immunologic: Negative.   Neurological: Negative.   Hematological: Negative.   Psychiatric/Behavioral: Negative.  Negative for suicidal ideas.       Objective:   Physical Exam  Constitutional: She is oriented to person, place, and time. She appears well-developed and well-nourished.  HENT:  Head: Normocephalic and atraumatic.  Right Ear: External ear normal.  Left Ear: External ear normal.  Nose: Nose normal.  Mouth/Throat: Oropharynx is clear and moist.  Eyes: Conjunctivae and EOM are normal. Pupils are equal, round, and reactive to light.  Neck: Normal range of motion. Neck supple.  Cardiovascular: Normal rate, regular rhythm, normal heart sounds and intact distal pulses.   Pulmonary/Chest: Effort normal and breath sounds normal.   Abdominal: Soft. Bowel sounds are normal.  Musculoskeletal:       Lumbar back: She exhibits decreased range of motion and pain.  Neurological: She is alert and oriented to person, place, and time. She has normal reflexes.  Skin: Skin is warm, dry and intact. No rash noted.  Fistula to left upper arm, mild erythema, no drainage, tender to touch     Psychiatric: She has a normal mood and affect. Her behavior is normal. Judgment and thought content normal.     BP 172/78 mmHg  Pulse 73  Temp(Src) 98.1 F (36.7 C) (Oral)  Resp 16  Ht 5\' 7"  (1.702 m)  Wt 183 lb (83.008 kg)  BMI 28.66 kg/m2  LMP 03/04/2015 Assessment & Plan:  1. Essential hypertension Blood pressure is not at goal on current medication regimen. Patient will follow up for blood pressure check in 1 week.  - metoprolol succinate (TOPROL-XL) 200 MG 24 hr tablet; Take 1 tablet (200 mg total) by mouth daily. Take with or immediately following a meal.  Dispense: 30 tablet; Refill: 0  2. Constipation, unspecified constipation type - docusate sodium (COLACE) 100 MG capsule; Take 1 capsule (100 mg total) by mouth 2 (two) times daily.  Dispense: 30 capsule; Refill: 2  3. CKD (chronic kidney disease) stage 5, GFR less than 15 ml/min (HCC) Continue to follow with [facility], Dr. Jimmy Footman as schedule. Patient had fistula placed in left upper arm on 03/04/2015, she is to f/u with Dr. Oneida Alar on 04/09/2015  4. Uncontrolled type 2 diabetes mellitus with hyperglycemia, with long-term current use of insulin (HCC) Continue Lantus 10 units HS. Also, patient is to bring glucometer to next appointment.    RTC: 1 month for hypertension follow-up  Erskine Steinfeldt M, FNP   The patient was given clear instructions to go to ER or return to medical center if symptoms do not improve, worsen or new problems develop. The patient verbalized understanding. Will notify patient with laboratory results.

## 2015-03-09 ENCOUNTER — Encounter: Payer: Self-pay | Admitting: Family Medicine

## 2015-03-09 MED FILL — FUROSEMIDE 40 MG TABLET: 40 | 31 days supply | Qty: 31 | Fill #0 | Status: TO

## 2015-03-09 NOTE — Patient Instructions (Signed)
Food Basics for Chronic Kidney Disease When your kidneys are not working well, they cannot remove waste and excess substances from your blood as effectively as they did before. This can lead to a buildup and imbalance of these substances, which can affect how your body functions. This buildup can also make your kidneys work harder, causing even more damage. You may need to eat less of certain foods that can lead to the buildup of these substances in your body. By making the changes to your diet that are recommended by your dietitian or health care provider, you could possibly help prevent further kidney damage and delay or prevent the need for dialysis. The following information can help give you a basic understanding of these substances and how they affect your bodily functions. The information also gives examples of foods that contain the highest amounts of these substances. WHAT DO I NEED TO KNOW ABOUT SUBSTANCES IN MY FOOD THAT I MAY NEED TO ADJUST? Food adjustments will be different for each person with chronic kidney disease. It is important that you see a dietitian who can help you determine the specific adjustments that you will need to make for each of the following substances: Potassium Potassium affects how steadily your heart beats. If too much potassium builds up in your blood, it can cause an irregular heartbeat or even a heart attack. Examples of foods rich in potassium include:  Milk.  Fruits.  Vegetables. Phosphorus Phosphorus is a mineral found in your bones. A balance between calcium and phosphorous is needed to build and maintain healthy bones. Too much phosphorus pulls calcium from your bones. This can make your bones weak and more likely to break. Too much phosphorus can also make your skin itch. Examples of foods rich in phosphorus include:  Milk and cheese.  Dried beans.  Peas.  Colas.  Nuts and peanut butter. Animal Protein Animal protein helps you make and keep  muscle. It also helps in the repair of your body's cells and tissues. One of the natural breakdown products of protein is a waste product called urea. When your kidneys are not working properly, they cannot remove wastes such as urea like they did before you developed chronic kidney disease. You will likely need to limit the amount of protein you eat to help prevent a buildup of urea in your blood. Examples of animal protein include:  Meat (all types).  Fish and seafood.  Poultry.  Eggs. Sodium Sodium, which is found in salt, helps maintain a healthy balance of fluids in your body. Too much sodium can increase your blood pressure level and have a negative affect on the function of your heart and lungs. Too much sodium also can cause your body to retain too much fluid, making your kidneys work harder. Examples of foods with high levels of sodium include:  Salt seasonings.  Soy sauce.  Cured and processed meats.  Salted crackers and snack foods.  Fast food.  Canned soups and most canned foods. Glucose Glucose provides energy for your body. If you have diabetes mellitus that is not properly controlled, you have too much glucose in your blood. Too much glucose in your blood can worsen the function of your kidneys by damaging small blood vessels. This prevents enough blood flow to your kidneys to give them what they need to work. If you have diabetes mellitus and chronic kidney disease, it is important to maintain your blood glucose at a level recommended by your health care provider. SHOULD I   TAKE A VITAMIN AND MINERAL SUPPLEMENT? Because you may need to avoid eating certain foods, you may not get all of the vitamins and minerals that would normally come from those foods. Your health care provider or dietitian may recommend that you take a supplement to ensure that you get all of the vitamins and minerals that your body needs.    This information is not intended to replace advice given to you  by your health care provider. Make sure you discuss any questions you have with your health care provider.   Document Released: 04/09/2002 Document Revised: 02/07/2014 Document Reviewed: 12/14/2012 Elsevier Interactive Patient Education 2016 Elsevier Inc. Chronic Kidney Disease Chronic kidney disease happens when the kidneys are damaged over a long period. The kidneys are two organs that do many important jobs in the body. These jobs include:  Removing wastes and extra fluids from the blood.  Making hormones that help to keep the body healthy.  Making sure that the body has the right amount of fluids and chemicals. Chronic kidney disease may be caused by many things. The kidney damage occurs slowly. If too much damage occurs, the kidneys may stop working the way that they should. This is dangerous. Treatment can help to slow down the damage and keep it from getting worse. HOME CARE  Follow your diet as told by your doctor. You may need to limit the amount of salt (sodium) and protein that you eat each day.  Take medicines only as told by your doctor. Do not take any new medicines unless your doctor approves it.  Quit smoking if you smoke. Talk to your doctor about programs that may help you quit smoking.  Have your blood pressure checked regularly and keep track of the results.  Start or keep doing an exercise plan.  Get shots (immunizations) as told by your doctor.  Take vitamins and minerals as told by your doctor.  Keep all follow-up visits as told by your doctor. This is important. GET HELP RIGHT AWAY IF:   Your symptoms get worse.  You have new symptoms.  You have symptoms of end-stage kidney disease. These include:  Headaches.  Skin that is darker or lighter than normal.  Numbness in the hands or feet.  Easy bruising.  Frequent hiccups.  Stopping of menstrual periods in women.  You have a fever.  You are making very little pee (urine).  You have pain or  bleeding when you pee.   This information is not intended to replace advice given to you by your health care provider. Make sure you discuss any questions you have with your health care provider.   Document Released: 04/13/2009 Document Revised: 10/08/2014 Document Reviewed: 09/16/2011 Elsevier Interactive Patient Education [employer].

## 2015-03-10 ENCOUNTER — Telehealth: Payer: Self-pay

## 2015-03-10 ENCOUNTER — Telehealth: Payer: Self-pay | Admitting: Family Medicine

## 2015-03-10 ENCOUNTER — Other Ambulatory Visit: Payer: Self-pay | Admitting: Family Medicine

## 2015-03-10 DIAGNOSIS — R05 Cough: Secondary | ICD-10-CM

## 2015-03-10 DIAGNOSIS — R059 Cough, unspecified: Secondary | ICD-10-CM

## 2015-03-10 MED ORDER — BENZONATATE 100 MG PO CAPS: 100.0000 mg | ORAL_CAPSULE | Freq: Three times a day (TID) | ORAL | Status: DC | PRN

## 2015-03-10 MED FILL — METOPROLOL SUCC ER 50 MG TA: 50 | 30 days supply | Qty: 120 | Fill #0

## 2015-03-10 NOTE — Telephone Encounter (Signed)
Patient calling for RX for cough. Please call asap.

## 2015-03-10 NOTE — Telephone Encounter (Signed)
[place],  Patient states she is still having the coughing and the "rx strength" robitussin that she was prescribed by previous provider is not helping. She wants to know if you can give her anything for this? Please advise. Thanks!

## 2015-03-10 NOTE — Telephone Encounter (Signed)
Phone call from pt.  Reported swelling of left upper extremity from fingers to shoulder.  Stated the left arm incisional area feels numb.  Denied any numbness in fingers/ hand.  Reported able to grip with left hand, without difficulty.  Denied fever; reported she stays cold due to "anemia".  Denied any incisional redness or drainage.  Encouraged to elevate her left arm at regular intervals above level of heart.  Encouraged to do gentle, active ROM with left hand.  Advised that incisional numbness is expected at this point in post-op recovery, and will improve with time.  Encouraged to watch for signs of infection, or any change in temperature, or change in sensation of left hand/ fingers, and call to report.  Verb. Understanding.

## 2015-03-11 MED FILL — CALCIUM ACETATE 667 MG CAP: 667 | 15 days supply | Qty: 45 | Fill #0

## 2015-03-11 MED FILL — BENZONATATE 100 MG CAPSULE: 100 | 7 days supply | Qty: 20 | Fill #0

## 2015-03-13 ENCOUNTER — Other Ambulatory Visit: Payer: PRIVATE HEALTH INSURANCE

## 2015-03-17 ENCOUNTER — Encounter: Payer: Self-pay | Admitting: *Deleted

## 2015-03-20 MED FILL — ?AZITHROMYCIN 250 MG TABLET: 250 MG | 5 days supply | Qty: 6 | Fill #0

## 2015-03-24 MED FILL — CALCIUM ACETATE 667 MG CAP: 667 | 30 days supply | Qty: 180 | Fill #0

## 2015-03-26 ENCOUNTER — Encounter (HOSPITAL_COMMUNITY): Payer: Self-pay | Admitting: *Deleted

## 2015-03-26 ENCOUNTER — Encounter: Payer: Self-pay | Admitting: Family Medicine

## 2015-03-26 DIAGNOSIS — N939 Abnormal uterine and vaginal bleeding, unspecified: Secondary | ICD-10-CM | POA: Diagnosis present

## 2015-03-26 DIAGNOSIS — E11319 Type 2 diabetes mellitus with unspecified diabetic retinopathy without macular edema: Secondary | ICD-10-CM | POA: Diagnosis not present

## 2015-03-26 DIAGNOSIS — Z79899 Other long term (current) drug therapy: Secondary | ICD-10-CM | POA: Insufficient documentation

## 2015-03-26 DIAGNOSIS — Z8701 Personal history of pneumonia (recurrent): Secondary | ICD-10-CM | POA: Diagnosis not present

## 2015-03-26 DIAGNOSIS — Z3202 Encounter for pregnancy test, result negative: Secondary | ICD-10-CM | POA: Insufficient documentation

## 2015-03-26 DIAGNOSIS — N183 Chronic kidney disease, stage 3 (moderate): Secondary | ICD-10-CM | POA: Diagnosis not present

## 2015-03-26 DIAGNOSIS — D649 Anemia, unspecified: Secondary | ICD-10-CM | POA: Insufficient documentation

## 2015-03-26 DIAGNOSIS — Z9889 Other specified postprocedural states: Secondary | ICD-10-CM | POA: Insufficient documentation

## 2015-03-26 DIAGNOSIS — N920 Excessive and frequent menstruation with regular cycle: Secondary | ICD-10-CM | POA: Insufficient documentation

## 2015-03-26 DIAGNOSIS — I509 Heart failure, unspecified: Secondary | ICD-10-CM | POA: Diagnosis not present

## 2015-03-26 DIAGNOSIS — R011 Cardiac murmur, unspecified: Secondary | ICD-10-CM | POA: Diagnosis not present

## 2015-03-26 DIAGNOSIS — Z7984 Long term (current) use of oral hypoglycemic drugs: Secondary | ICD-10-CM | POA: Diagnosis not present

## 2015-03-26 DIAGNOSIS — Z8719 Personal history of other diseases of the digestive system: Secondary | ICD-10-CM | POA: Diagnosis not present

## 2015-03-26 DIAGNOSIS — Z794 Long term (current) use of insulin: Secondary | ICD-10-CM | POA: Diagnosis not present

## 2015-03-26 DIAGNOSIS — I129 Hypertensive chronic kidney disease with stage 1 through stage 4 chronic kidney disease, or unspecified chronic kidney disease: Secondary | ICD-10-CM | POA: Diagnosis not present

## 2015-03-26 DIAGNOSIS — Z7982 Long term (current) use of aspirin: Secondary | ICD-10-CM | POA: Insufficient documentation

## 2015-03-26 DIAGNOSIS — E785 Hyperlipidemia, unspecified: Secondary | ICD-10-CM | POA: Insufficient documentation

## 2015-03-26 LAB — HCG, QUANTITATIVE, PREGNANCY: hCG, Beta Chain, Quant, S: 1 m[IU]/mL (ref <5)

## 2015-03-26 LAB — CBC WITH DIFFERENTIAL/PLATELET
BASOS PCT: 0 %
Basophils Absolute: 0 10*3/uL (ref 0.0–0.1)
EOS ABS: 0.2 10*3/uL (ref 0.0–0.7)
Eosinophils Relative: 3 %
HCT: 23.6 % — ABNORMAL LOW (ref 36.0–46.0)
HEMOGLOBIN: 7.9 g/dL — AB (ref 12.0–15.0)
LYMPHS ABS: 2.2 10*3/uL (ref 0.7–4.0)
Lymphocytes Relative: 35 %
MCH: 30 pg (ref 26.0–34.0)
MCHC: 33.5 g/dL (ref 30.0–36.0)
MCV: 89.7 fL (ref 78.0–100.0)
MONOS PCT: 4 %
Monocytes Absolute: 0.3 10*3/uL (ref 0.1–1.0)
NEUTROS ABS: 3.6 10*3/uL (ref 1.7–7.7)
NEUTROS PCT: 58 %
Platelets: 257 10*3/uL (ref 150–400)
RBC: 2.63 MIL/uL — AB (ref 3.87–5.11)
RDW: 13.3 % (ref 11.5–15.5)
WBC: 6.3 10*3/uL (ref 4.0–10.5)

## 2015-03-26 LAB — POC URINE PREG, ED: Preg Test, Ur: NEGATIVE

## 2015-03-26 NOTE — ED Notes (Signed)
The pt is having her period and she reports that it is much heavier than usual with clots.  Last month  Jan her period was normal

## 2015-03-27 ENCOUNTER — Other Ambulatory Visit (HOSPITAL_COMMUNITY): Payer: Self-pay | Admitting: *Deleted

## 2015-03-27 ENCOUNTER — Emergency Department (HOSPITAL_COMMUNITY)
Admission: EM | Admit: 2015-03-27 | Discharge: 2015-03-27 | Disposition: A | Discharge status: Home / Self Care | Payer: Medicaid Other | Attending: Emergency Medicine | Admitting: Emergency Medicine

## 2015-03-27 DIAGNOSIS — N920 Excessive and frequent menstruation with regular cycle: Secondary | ICD-10-CM

## 2015-03-27 DIAGNOSIS — D649 Anemia, unspecified: Secondary | ICD-10-CM

## 2015-03-27 MED ORDER — FERROUS SULFATE 325 (65 FE) MG PO TABS: 325.0000 mg | ORAL_TABLET | Freq: Every day | ORAL | Status: DC

## 2015-03-27 NOTE — ED Provider Notes (Signed)
CSN: [id number]     Arrival date & time 03/26/15  2022 History  By signing my name below, I, Christian Pulliam, attest that this documentation has been prepared under the direction and in the presence of Whitney Plunkett, MD.   Electronically Signed: Christian Pulliam, ED Scribe. 03/27/2015. 1:15 AM     Chief Complaint  Patient presents with  . Vaginal Bleeding     The history is provided by the patient. No language interpreter was used.   HPI Comments: Jade Bond is a 49 y.o. female with PMHx of HTN, HLD, and who presents to the Emergency Department complaining of sudden onset, constant vaginal bleeding, onset earlier today. Pt reports that today was the second day of her period and notes associated clotting and increase in flow, prompting her to come to the ED. Pt reports that her LNMP was 02/05/2015 and she states that her periods are not usually heavy. No worsening or alleviating factors noted. Pt denies vaginal burning, vaginal discharge, or any other pertinent symptoms. She takes a daily aspirin EC 81 MG tablet and her kidney doctor called four days ago and informed her that her hemoglobin was low.   Past Medical History  Diagnosis Date  . Hypertension   . Hyperlipidemia   . Diabetes mellitus   . Pneumonia 07/04/2013  . CKD (chronic kidney disease), stage III   . Normocytic anemia   . Chest pain     a. 2D echo 01/2010 - mild TR, LVEF 60% with normal wall thickness, trace MR, mild TR, normal RV, trace pericardial effusion. b. Normal nuc 03/2011 at [facility].  . Hypoalbuminemia   . CHF (congestive heart failure) (HCC)   . Heart murmur     as a child  . GERD (gastroesophageal reflux disease)   . Diabetic retinopathy (HCC)    Past Surgical History  Procedure Laterality Date  . Tonsillectomy  1980  . Cardiac catheterization  ~ 2008    negative, no intervention needed  . Tubal ligation  1995  . Eye surgery      eye injections  . Dilation and curettage of uterus      x 3  .  Breast surgery      lumpectomy  . Bascilic vein transposition Left 03/04/2015    Procedure: BASILIC VEIN TRANSPOSITION;  Surgeon: Charles E Fields, MD;  Location: [facility] OR;  Service: Vascular;  Laterality: Left;   Family History  Problem Relation Age of Onset  . Malignant hyperthermia Mother   . Diabetes Mellitus II Mother   . Heart attack Father     Age 47  . Kidney disease Father   . Diabetes Mellitus II Sister   . Hypertension Sister    Social History  Substance Use Topics  . Smoking status: Never Smoker   . Smokeless tobacco: Never Used  . Alcohol Use: Yes     Comment: occasional drink of wine - 1 glass/week or less   OB History    No data available     Review of Systems A complete 10 system review of systems was obtained and all systems are negative except as noted in the HPI and PMH.    Allergies  Review of patient's allergies indicates no known allergies.  Home Medications   Prior to Admission medications   Medication Sig Start Date End Date Taking? Authorizing Provider  acetaminophen (TYLENOL) 500 MG tablet Take 2 tablets (1,000 mg total) by mouth every 6 (six) hours as needed for moderate pain. 09/30/14     Domenic Moras, PA-C  acetaminophen-codeine (TYLENOL #3) 300-30 MG tablet Take 1 tablet by mouth every 6 (six) hours as needed for moderate pain. 02/06/15   Dorena Dew, FNP  aspirin EC 81 MG tablet Take 81 mg by mouth daily.    Historical Provider, MD  atorvastatin (LIPITOR) 80 MG tablet Take 1 tablet (80 mg total) by mouth daily. 11/13/14   Nicole Pisciotta, PA-C  benzonatate (TESSALON PERLES) 100 MG capsule Take 1 capsule (100 mg total) by mouth 3 (three) times daily as needed for cough. 03/10/15   Dorena Dew, FNP  blood glucose meter kit and supplies KIT Dispense based on patient and insurance preference. Use up to four times daily as directed. (FOR ICD-9 250.00, 250.01). 11/13/14   Nicole Pisciotta, PA-C  calcitRIOL (ROCALTROL) 0.25 MCG capsule Take 0.5 mcg by  mouth daily.     Historical Provider, MD  calcium acetate (PHOSLO) 667 MG capsule Take 667 mg by mouth 3 (three) times daily with meals.    Historical Provider, MD  docusate sodium (COLACE) 100 MG capsule Take 1 capsule (100 mg total) by mouth 2 (two) times daily. 03/06/15   Dorena Dew, FNP  glipiZIDE (GLUCOTROL) 10 MG tablet Take 1 tablet (10 mg total) by mouth 2 (two) times daily before a meal. 01/22/15   Dorena Dew, FNP  glucose blood (TRUE METRIX BLOOD GLUCOSE TEST) test strip Use as instructed 01/02/15   Dorena Dew, FNP  Insulin Glargine (LANTUS SOLOSTAR) 100 UNIT/ML Solostar Pen Inject 10 Units into the skin daily at 10 pm. 02/06/15   Dorena Dew, FNP  Insulin Pen Needle (BD PEN NEEDLE NANO U/F) 32G X 4 MM MISC Use 1x a day 07/18/14   Linton Flemings, MD  metoprolol succinate (TOPROL-XL) 200 MG 24 hr tablet Take 1 tablet (200 mg total) by mouth daily. Take with or immediately following a meal. 03/06/15   Dorena Dew, FNP  nitroGLYCERIN (NITROSTAT) 0.4 MG SL tablet Place 1 tablet (0.4 mg total) under the tongue every 5 (five) minutes as needed. For chest pain 11/13/14   Monico Blitz, PA-C  oxyCODONE-acetaminophen (PERCOCET/ROXICET) 5-325 MG tablet Take 1-2 tablets by mouth every 6 (six) hours as needed. 03/04/15   Elam Dutch, MD   BP 143/73 mmHg  Pulse 72  Temp(Src) 98.2 F (36.8 C) (Oral)  Resp 16  Ht 5' 7" (1.702 m)  Wt 181 lb 4 oz (82.214 kg)  BMI 28.38 kg/m2  SpO2 100%  LMP 03/04/2015 Physical Exam  Constitutional: She is oriented to person, place, and time. She appears well-developed and well-nourished. No distress.  HENT:  Head: Normocephalic and atraumatic.  Eyes: EOM are normal.  Neck: Normal range of motion.  Cardiovascular: Normal rate, regular rhythm and normal heart sounds.   Pulmonary/Chest: Effort normal and breath sounds normal.  Abdominal: Soft. She exhibits no distension. There is no tenderness. There is no guarding.  Genitourinary: There is  bleeding in the vagina. No erythema or tenderness in the vagina. No foreign body around the vagina. No signs of injury around the vagina.  Some clot in vaginal canal. Mild oozing of blood from cervix.   Musculoskeletal: Normal range of motion.  Neurological: She is alert and oriented to person, place, and time.  Skin: Skin is warm and dry.  Healing graft in left upper arm. Surgical sites are clean and dry.   Psychiatric: She has a normal mood and affect. Judgment normal.  Nursing note and vitals reviewed.  ED Course  Procedures (including critical care time) DIAGNOSTIC STUDIES: Oxygen Saturation is 100% on RA, normal by my interpretation.    COORDINATION OF CARE: 1:07 AM-Discussed treatment plan which includes CBC with differential and pelvic cart with pt at bedside and pt agreed to plan.    Labs Review Labs Reviewed  CBC WITH DIFFERENTIAL/PLATELET - Abnormal; Notable for the following:    RBC 2.63 (*)    Hemoglobin 7.9 (*)    HCT 23.6 (*)    All other components within normal limits  HCG, QUANTITATIVE, PREGNANCY  POC URINE PREG, ED    Imaging Review No results found. I have personally reviewed and evaluated these images and lab results as part of my medical decision-making.   EKG Interpretation None      MDM   Final diagnoses:  Anemia, unspecified anemia type  Menorrhagia with regular cycle   Patient is a 51 year old female with heavy vaginal bleeding today with some dysmenorrhea. Bleeding is now starting to slow down and on pelvic exam there is few blood clots but no profuse bleeding.  Patient has multiple medical conditions including a recent fistula placed for renal disease. Patient is anemic today but she has chronic anemia. Hemodynamically stable at this time and no indication for blood transfusion. Given patient is only been bleeding for one day and encouraged her to follow-up with her OB/GYN if bleeding becomes heavier.  I personally performed the services  described in this documentation, which was scribed in my presence.  The recorded information has been reviewed and considered.      Blanchie Dessert, MD 04/02/15 [phone]

## 2015-03-27 NOTE — Discharge Instructions (Signed)
Abnormal Uterine Bleeding Abnormal uterine bleeding means bleeding from the vagina that is not your normal menstrual period. This can be:  Bleeding or spotting between periods.  Bleeding after sex (sexual intercourse).  Bleeding that is heavier or more than normal.  Periods that last longer than usual.  Bleeding after menopause. There are many problems that may cause this. Treatment will depend on the cause of the bleeding. Any kind of bleeding that is not normal should be reviewed by your doctor.  HOME CARE Watch your condition for any changes. These actions may lessen any discomfort you are having:  Do not use tampons or douches as told by your doctor.  Change your pads often. You should get regular pelvic exams and Pap tests. Keep all appointments for tests as told by your doctor. GET HELP IF:  You are bleeding for more than 1 week.  You feel dizzy at times. GET HELP RIGHT AWAY IF:   You pass out.  You have to change pads every 15 to 30 minutes.  You have belly pain.  You have a fever.  You become sweaty or weak.  You are passing large blood clots from the vagina.  You feel sick to your stomach (nauseous) and throw up (vomit). MAKE SURE YOU:  Understand these instructions.  Will watch your condition.  Will get help right away if you are not doing well or get worse.   This information is not intended to replace advice given to you by your health care provider. Make sure you discuss any questions you have with your health care provider.   Document Released: 11/14/2008 Document Revised: 01/22/2013 Document Reviewed: 08/16/2012 Elsevier Interactive Patient Education 2016 [employer].  Anemia, Nonspecific Anemia is a condition in which the concentration of red blood cells or hemoglobin in the blood is below normal. Hemoglobin is a substance in red blood cells that carries oxygen to the tissues of the body. Anemia results in not enough oxygen reaching these  tissues.  CAUSES  Common causes of anemia include:   Excessive bleeding. Bleeding may be internal or external. This includes excessive bleeding from periods (in women) or from the intestine.   Poor nutrition.   Chronic kidney, thyroid, and liver disease.  Bone marrow disorders that decrease red blood cell production.  Cancer and treatments for cancer.  HIV, AIDS, and their treatments.  Spleen problems that increase red blood cell destruction.  Blood disorders.  Excess destruction of red blood cells due to infection, medicines, and autoimmune disorders. SIGNS AND SYMPTOMS   Minor weakness.   Dizziness.   Headache.  Palpitations.   Shortness of breath, especially with exercise.   Paleness.  Cold sensitivity.  Indigestion.  Nausea.  Difficulty sleeping.  Difficulty concentrating. Symptoms may occur suddenly or they may develop slowly.  DIAGNOSIS  Additional blood tests are often needed. These help your health care provider determine the best treatment. Your health care provider will check your stool for blood and look for other causes of blood loss.  TREATMENT  Treatment varies depending on the cause of the anemia. Treatment can include:   Supplements of iron, vitamin W80, or folic acid.   Hormone medicines.   A blood transfusion. This may be needed if blood loss is severe.   Hospitalization. This may be needed if there is significant continual blood loss.   Dietary changes.  Spleen removal. HOME CARE INSTRUCTIONS Keep all follow-up appointments. It often takes many weeks to correct anemia, and having your health care  provider check on your condition and your response to treatment is very important. SEEK IMMEDIATE MEDICAL CARE IF:   You develop extreme weakness, shortness of breath, or chest pain.   You become dizzy or have trouble concentrating.  You develop heavy vaginal bleeding.   You develop a rash.   You have bloody or black,  tarry stools.   You faint.   You vomit up blood.   You vomit repeatedly.   You have abdominal pain.  You have a fever or persistent symptoms for more than 2-3 days.   You have a fever and your symptoms suddenly get worse.   You are dehydrated.  MAKE SURE YOU:  Understand these instructions.  Will watch your condition.  Will get help right away if you are not doing well or get worse.   This information is not intended to replace advice given to you by your health care provider. Make sure you discuss any questions you have with your health care provider.   Document Released: 02/25/2004 Document Revised: 09/19/2012 Document Reviewed: 07/13/2012 Elsevier Interactive Patient Education [employer].

## 2015-03-30 ENCOUNTER — Ambulatory Visit (HOSPITAL_COMMUNITY)
Admission: RE | Admit: 2015-03-30 | Discharge: 2015-03-30 | Disposition: A | Discharge status: Home / Self Care | Payer: Medicaid Other | Source: Ambulatory Visit | Attending: Nephrology | Admitting: Nephrology

## 2015-03-30 DIAGNOSIS — Z5181 Encounter for therapeutic drug level monitoring: Secondary | ICD-10-CM | POA: Insufficient documentation

## 2015-03-30 DIAGNOSIS — D631 Anemia in chronic kidney disease: Secondary | ICD-10-CM | POA: Diagnosis not present

## 2015-03-30 DIAGNOSIS — N185 Chronic kidney disease, stage 5: Secondary | ICD-10-CM | POA: Insufficient documentation

## 2015-03-30 DIAGNOSIS — Z79899 Other long term (current) drug therapy: Secondary | ICD-10-CM | POA: Diagnosis not present

## 2015-03-30 LAB — POCT HEMOGLOBIN-HEMACUE: Hemoglobin: 7.7 g/dL — ABNORMAL LOW (ref 12.0–15.0)

## 2015-03-30 MED ORDER — EPOETIN ALFA 40000 UNIT/ML IJ SOLN: 30000.0000 [IU] | INTRAMUSCULAR | Status: DC

## 2015-03-30 MED ORDER — CLONIDINE HCL 0.1 MG PO TABS
0.1000 mg | ORAL_TABLET | Freq: Once | ORAL | Status: DC | PRN
Start: 2015-03-30 — End: 2015-03-31

## 2015-03-30 MED ORDER — EPOETIN ALFA 20000 UNIT/ML IJ SOLN
INTRAMUSCULAR | Status: AC
  Administered 2015-03-30: 20000 [IU] via SUBCUTANEOUS
  Filled 2015-03-30: qty 1

## 2015-03-30 MED ORDER — EPOETIN ALFA 10000 UNIT/ML IJ SOLN
INTRAMUSCULAR | Status: AC
  Administered 2015-03-30: 10000 [IU] via SUBCUTANEOUS
  Filled 2015-03-30: qty 1

## 2015-03-30 NOTE — Discharge Instructions (Signed)
Epoetin Alfa injection What is this medicine? EPOETIN ALFA (e POE e tin AL fa) helps your body make more red blood cells. This medicine is used to treat anemia caused by chronic kidney failure, cancer chemotherapy, or HIV-therapy. It may also be used before surgery if you have anemia. This medicine may be used for other purposes; ask your health care provider or pharmacist if you have questions. What should I tell my health care provider before I take this medicine? They need to know if you have any of these conditions: -blood clotting disorders -cancer patient not on chemotherapy -cystic fibrosis -heart disease, such as angina or heart failure -hemoglobin level of 12 g/dL or greater -high blood pressure -low levels of folate, iron, or vitamin B12 -seizures -an unusual or allergic reaction to erythropoietin, albumin, benzyl alcohol, hamster proteins, other medicines, foods, dyes, or preservatives -pregnant or trying to get pregnant -breast-feeding How should I use this medicine? This medicine is for injection into a vein or under the skin. It is usually given by a health care professional in a hospital or clinic setting. If you get this medicine at home, you will be taught how to prepare and give this medicine. Use exactly as directed. Take your medicine at regular intervals. Do not take your medicine more often than directed. It is important that you put your used needles and syringes in a special sharps container. Do not put them in a trash can. If you do not have a sharps container, call your pharmacist or healthcare provider to get one. Talk to your pediatrician regarding the use of this medicine in children. While this drug may be prescribed for selected conditions, precautions do apply. Overdosage: If you think you have taken too much of this medicine contact a poison control center or emergency room at once. NOTE: This medicine is only for you. Do not share this medicine with  others. What if I miss a dose? If you miss a dose, take it as soon as you can. If it is almost time for your next dose, take only that dose. Do not take double or extra doses. What may interact with this medicine? Do not take this medicine with any of the following medications: -darbepoetin alfa This list may not describe all possible interactions. Give your health care provider a list of all the medicines, herbs, non-prescription drugs, or dietary supplements you use. Also tell them if you smoke, drink alcohol, or use illegal drugs. Some items may interact with your medicine. What should I watch for while using this medicine? Visit your prescriber or health care professional for regular checks on your progress and for the needed blood tests and blood pressure measurements. It is especially important for the doctor to make sure your hemoglobin level is in the desired range, to limit the risk of potential side effects and to give you the best benefit. Keep all appointments for any recommended tests. Check your blood pressure as directed. Ask your doctor what your blood pressure should be and when you should contact him or her. As your body makes more red blood cells, you may need to take iron, folic acid, or vitamin B supplements. Ask your doctor or health care provider which products are right for you. If you have kidney disease continue dietary restrictions, even though this medication can make you feel better. Talk with your doctor or health care professional about the foods you eat and the vitamins that you take. What side effects may I notice   from receiving this medicine? Side effects that you should report to your doctor or health care professional as soon as possible: -allergic reactions like skin rash, itching or hives, swelling of the face, lips, or tongue -breathing problems -changes in vision -chest pain -confusion, trouble speaking or understanding -feeling faint or lightheaded,  falls -high blood pressure -muscle aches or pains -pain, swelling, warmth in the leg -rapid weight gain -severe headaches -sudden numbness or weakness of the face, arm or leg -trouble walking, dizziness, loss of balance or coordination -seizures (convulsions) -swelling of the ankles, feet, hands -unusually weak or tired Side effects that usually do not require medical attention (report to your doctor or health care professional if they continue or are bothersome): -diarrhea -fever, chills (flu-like symptoms) -headaches -nausea, vomiting -redness, stinging, or swelling at site where injected This list may not describe all possible side effects. Call your doctor for medical advice about side effects. You may report side effects to FDA at 1-800-FDA-1088. Where should I keep my medicine? Keep out of the reach of children. Store in a refrigerator between 2 and 8 degrees C (36 and 46 degrees F). Do not freeze or shake. Throw away any unused portion if using a single-dose vial. Multi-dose vials can be kept in the refrigerator for up to 21 days after the initial dose. Throw away unused medicine. NOTE: This sheet is a summary. It may not cover all possible information. If you have questions about this medicine, talk to your doctor, pharmacist, or health care provider.    2016, Elsevier/Gold Standard. (2008-01-01 10:25:44)  

## 2015-04-06 ENCOUNTER — Encounter: Payer: Self-pay | Admitting: Vascular Surgery

## 2015-04-09 ENCOUNTER — Ambulatory Visit (INDEPENDENT_AMBULATORY_CARE_PROVIDER_SITE_OTHER): Payer: Self-pay | Admitting: Vascular Surgery

## 2015-04-09 ENCOUNTER — Ambulatory Visit (HOSPITAL_COMMUNITY)
Admission: RE | Admit: 2015-04-09 | Discharge: 2015-04-09 | Disposition: A | Discharge status: Home / Self Care | Payer: Medicaid Other | Source: Ambulatory Visit | Attending: Vascular Surgery | Admitting: Vascular Surgery

## 2015-04-09 ENCOUNTER — Encounter: Payer: Self-pay | Admitting: Vascular Surgery

## 2015-04-09 VITALS — BP 173/84 | HR 72 | Temp 98.7°F | Resp 18 | Ht 67.0 in | Wt 185.7 lb

## 2015-04-09 DIAGNOSIS — E11319 Type 2 diabetes mellitus with unspecified diabetic retinopathy without macular edema: Secondary | ICD-10-CM | POA: Insufficient documentation

## 2015-04-09 DIAGNOSIS — Z4931 Encounter for adequacy testing for hemodialysis: Secondary | ICD-10-CM | POA: Diagnosis not present

## 2015-04-09 DIAGNOSIS — E1122 Type 2 diabetes mellitus with diabetic chronic kidney disease: Secondary | ICD-10-CM | POA: Insufficient documentation

## 2015-04-09 DIAGNOSIS — E785 Hyperlipidemia, unspecified: Secondary | ICD-10-CM | POA: Insufficient documentation

## 2015-04-09 DIAGNOSIS — I509 Heart failure, unspecified: Secondary | ICD-10-CM | POA: Diagnosis not present

## 2015-04-09 DIAGNOSIS — N186 End stage renal disease: Secondary | ICD-10-CM

## 2015-04-09 DIAGNOSIS — N184 Chronic kidney disease, stage 4 (severe): Secondary | ICD-10-CM

## 2015-04-09 DIAGNOSIS — I132 Hypertensive heart and chronic kidney disease with heart failure and with stage 5 chronic kidney disease, or end stage renal disease: Secondary | ICD-10-CM | POA: Diagnosis not present

## 2015-04-09 NOTE — Progress Notes (Signed)
Patient is a 50 year old female who is status post left basilic vein transposition fistula could were first. She is currently not on hemodialysis. She denies any numbness or tingling in her hand.  Physical exam:  Filed Vitals:   04/09/15 1436 04/09/15 1441  BP: 178/79 173/84  Pulse: 72   Temp: 98.7 F (37.1 C)   TempSrc: Oral   Resp: 18   Height: 5\' 7"  (1.702 m)   Weight: 185 lb 11.2 oz (84.233 kg)   SpO2: 100%      Easily palpable thrill in fistula. Fistula is palpable throughout the upper arm. No numbness or tingling in the hand. Incisions all well-healed.   data: Left upper arm AV fistula 6 mm diameter less than 3 mm from the skin  Assessment: Maturing fistula left arm  Plan: Follow-up as needed she'll should be ready for use in May 2017.  Ruta Hinds, MD [facility] Office: [phone] Pager: [phone]

## 2015-04-09 NOTE — Progress Notes (Signed)
Filed Vitals:   04/09/15 1436 04/09/15 1441  BP: 178/79 173/84  Pulse: 72   Temp: 98.7 F (37.1 C)   TempSrc: Oral   Resp: 18   Height: 5\' 7"  (1.702 m)   Weight: 185 lb 11.2 oz (84.233 kg)   SpO2: 100%

## 2015-04-13 ENCOUNTER — Other Ambulatory Visit (HOSPITAL_COMMUNITY): Payer: Self-pay | Admitting: *Deleted

## 2015-04-14 ENCOUNTER — Inpatient Hospital Stay (HOSPITAL_COMMUNITY): Admission: RE | Admit: 2015-04-14 | Payer: Self-pay | Source: Ambulatory Visit

## 2015-04-16 ENCOUNTER — Ambulatory Visit (INDEPENDENT_AMBULATORY_CARE_PROVIDER_SITE_OTHER): Payer: Self-pay | Admitting: Family Medicine

## 2015-04-16 VITALS — BP 166/68 | HR 67 | Temp 97.9°F | Resp 16 | Ht 67.0 in | Wt 183.0 lb

## 2015-04-16 DIAGNOSIS — R8290 Unspecified abnormal findings in urine: Secondary | ICD-10-CM

## 2015-04-16 DIAGNOSIS — N185 Chronic kidney disease, stage 5: Secondary | ICD-10-CM

## 2015-04-16 DIAGNOSIS — I10 Essential (primary) hypertension: Secondary | ICD-10-CM

## 2015-04-16 DIAGNOSIS — N939 Abnormal uterine and vaginal bleeding, unspecified: Secondary | ICD-10-CM | POA: Insufficient documentation

## 2015-04-16 DIAGNOSIS — Z794 Long term (current) use of insulin: Secondary | ICD-10-CM

## 2015-04-16 DIAGNOSIS — E1165 Type 2 diabetes mellitus with hyperglycemia: Secondary | ICD-10-CM

## 2015-04-16 DIAGNOSIS — N921 Excessive and frequent menstruation with irregular cycle: Secondary | ICD-10-CM

## 2015-04-16 LAB — POCT URINALYSIS DIP (DEVICE)
Bilirubin Urine: NEGATIVE
GLUCOSE, UA: 100 mg/dL — AB
KETONES UR: NEGATIVE mg/dL
Nitrite: NEGATIVE
Specific Gravity, Urine: 1.02 (ref 1.005–1.030)
UROBILINOGEN UA: 0.2 mg/dL (ref 0.0–1.0)
pH: 7 (ref 5.0–8.0)

## 2015-04-16 MED FILL — glipiZIDE 10 MG TABS: 10 | 30 days supply | Qty: 60 | Fill #2

## 2015-04-16 MED FILL — CALCITRIOL 0.25 MCG CAPSULE: 0.25 | 30 days supply | Qty: 60 | Fill #0

## 2015-04-16 MED FILL — FUROSEMIDE 40 MG TABLET: 40 | 31 days supply | Qty: 31 | Fill #1 | Status: TO

## 2015-04-16 MED FILL — METOPROLOL SUCC ER 50 MG TA: 50 | 30 days supply | Qty: 30 | Fill #2

## 2015-04-16 NOTE — Progress Notes (Signed)
Subjective:    Patient ID: Jade Bond, female    DOB: 1965/04/15, 50 y.o.   MRN: [id number]  Hypertension  Diabetes Pertinent negatives for diabetes include no polydipsia, no polyphagia and no polyuria.   [name], a 50 year old female presents for a follow up of uncontrolled hypertension and diabetes. Ms. Ferko also has a history of uncontrolled hypertension and stage 5 chronic kidney disease. She is followed by Dr. Jeneen Rinks Deterding, nephrologist for end stage kidney disease. She recently had a fistula placed in the left basilic vein previously by Dr. Ruta Hinds, vascular surgery without complication.   She states that she has taken antihypertensive medication today. She last took Metoprolol on last night.  She maintains that she does not exercise or follow a low fat diet. She has been consistent with taking medications. She reports that she does not smoke, but she has smokers living in her home. She states that she has had chest pains in the past. She currently denies chest pains, heart palpitations, or lower extremity edema. Previous urine studies indicated microalbuminuria.   Patient also has a history of type 2 diabetes mellitus. Previous hemoglobin a1C was 7.7%. She has not  been taking Lantus consistently and following a low carbohydrate diet. Symptoms have decreased since starting current medication regimen. She has been checking blood sugars consistently at home. Blood sugars were previously running low, Lantus was previously  lowered to 10 units HS.  Evaluation to date has been included: fasting lipid panel, hemoglobin A1C and microalbuminuria.    Past Medical History  Diagnosis Date  . Hypertension   . Hyperlipidemia   . Diabetes mellitus   . Pneumonia 07/04/2013  . CKD (chronic kidney disease), stage III   . Normocytic anemia   . Chest pain     a. 2D echo 01/2010 - mild TR, LVEF 60% with normal wall thickness, trace MR, mild TR, normal RV, trace pericardial effusion.  b. Normal nuc 03/2011 at [facility].  . Hypoalbuminemia   . CHF (congestive heart failure) ([place])   . Heart murmur     as a child  . GERD (gastroesophageal reflux disease)   . Diabetic retinopathy ([place])    Immunization History  Administered Date(s) Administered  . Influenza Split 03/19/2011, 11/24/2011  . Influenza,inj,Quad PF,36+ Mos 11/27/2014  . Pneumococcal Polysaccharide-23 03/19/2011  No Known Allergies Social History   Social History  . Marital Status: Legally Separated    Spouse Name: N/A  . Number of Children: N/A  . Years of Education: N/A   Occupational History  .      Food Therapist, nutritional   Social History Main Topics  . Smoking status: Never Smoker   . Smokeless tobacco: Never Used  . Alcohol Use: Yes     Comment: occasional drink of wine - 1 glass/week or less  . Drug Use: No  . Sexual Activity: Not on file   Other Topics Concern  . Not on file   Social History Narrative   Review of Systems  Constitutional: Negative.   Eyes: Negative for photophobia.  Respiratory: Negative for apnea and cough.   Cardiovascular: Negative.   Gastrointestinal: Positive for constipation.  Endocrine: Negative for polydipsia, polyphagia and polyuria.  Genitourinary: Negative.   Musculoskeletal: Positive for myalgias (post surgical pain to left upper arm).  Skin: Negative.   Allergic/Immunologic: Negative.   Neurological: Negative.   Hematological: Negative.   Psychiatric/Behavioral: Negative.  Negative for suicidal ideas.       Objective:  Physical Exam  Constitutional: She is oriented to person, place, and time. She appears well-developed and well-nourished.  HENT:  Head: Normocephalic and atraumatic.  Right Ear: External ear normal.  Left Ear: External ear normal.  Nose: Nose normal.  Mouth/Throat: Oropharynx is clear and moist.  Eyes: Conjunctivae and EOM are normal. Pupils are equal, round, and reactive to light.  Neck: Normal range of motion. Neck supple.   Cardiovascular: Normal rate, regular rhythm, normal heart sounds and intact distal pulses.   Pulmonary/Chest: Effort normal and breath sounds normal.  Abdominal: Soft. Bowel sounds are normal.  Musculoskeletal:       Lumbar back: She exhibits decreased range of motion and pain.  Neurological: She is alert and oriented to person, place, and time. She has normal reflexes.  Skin: Skin is warm, dry and intact. No rash noted.  Fistula to left upper arm, mild erythema, no drainage, tender to touch     Psychiatric: She has a normal mood and affect. Her behavior is normal. Judgment and thought content normal.     BP 161/62 mmHg  Pulse 67  Temp(Src) 97.9 F (36.6 C) (Oral)  Resp 16  Ht 5\' 7"  (1.702 m)  Wt 183 lb (83.008 kg)  BMI 28.66 kg/m2  LMP 04/14/2015 Assessment & Plan:  1. Essential hypertension Blood pressure is not at goal on current medication regimen. Patient has not taken bp medications since last night.  - metoprolol succinate (TOPROL-XL) 200 MG 24 hr tablet; Take 1 tablet (200 mg total) by mouth daily. Take with or immediately following a meal.  Dispense: 30 tablet; Refill: 0 - COMPLETE METABOLIC PANEL WITH GFR - POCT urinalysis dipstick  2. Uncontrolled type 2 diabetes mellitus with hyperglycemia, with long-term current use of insulin (HCC) Continue Lantus 10 units HS. Also, patient is to bring glucometer to next appointment. There have been some non adherence issues here. I have discussed with her the great importance of following the treatment plan exactly as directed in order to achieve a good medical outcome. - Hemoglobin A1c - COMPLETE METABOLIC PANEL WITH GFR  3. CKD (chronic kidney disease) stage 5, GFR less than 15 ml/min (HCC) Continue to follow with [facility], Dr. Jimmy Footman as scheduled. She is also receiving Epein alfa per Dr. Jimmy Footman.    4. Menorrhagia with irregular cycle - US Transvaginal Non-OB; Future - US Pelvis Complete; Future  5. Abnormal  urinalysis - Urine culture     RTC: 3 months for hypertension. Will also follow up by phone to discuss laboratory results. Will also fax all labs to Dr. Cristopher Peru, FNP   The patient was given clear instructions to go to ER or return to medical center if symptoms do not improve, worsen or new problems develop. The patient verbalized understanding. Will notify patient with laboratory results.

## 2015-04-17 ENCOUNTER — Encounter (HOSPITAL_COMMUNITY)
Admission: RE | Admit: 2015-04-17 | Discharge: 2015-04-17 | Disposition: A | Discharge status: Home / Self Care | Payer: No Typology Code available for payment source | Source: Ambulatory Visit | Attending: Nephrology | Admitting: Nephrology

## 2015-04-17 DIAGNOSIS — Z79899 Other long term (current) drug therapy: Secondary | ICD-10-CM | POA: Insufficient documentation

## 2015-04-17 DIAGNOSIS — Z5181 Encounter for therapeutic drug level monitoring: Secondary | ICD-10-CM | POA: Insufficient documentation

## 2015-04-17 DIAGNOSIS — D631 Anemia in chronic kidney disease: Secondary | ICD-10-CM | POA: Insufficient documentation

## 2015-04-17 DIAGNOSIS — N185 Chronic kidney disease, stage 5: Secondary | ICD-10-CM | POA: Insufficient documentation

## 2015-04-17 LAB — HEMOGLOBIN A1C
Hgb A1c MFr Bld: 6.7 % — ABNORMAL HIGH (ref <5.7)
Mean Plasma Glucose: 146 mg/dL — ABNORMAL HIGH (ref <117)

## 2015-04-17 LAB — POCT HEMOGLOBIN-HEMACUE: Hemoglobin: 7.3 g/dL — ABNORMAL LOW (ref 12.0–15.0)

## 2015-04-17 LAB — COMPLETE METABOLIC PANEL WITH GFR
ALBUMIN: 2.9 g/dL — AB (ref 3.6–5.1)
ALT: 10 U/L (ref 6–29)
AST: 9 U/L — AB (ref 10–35)
Alkaline Phosphatase: 52 U/L (ref 33–115)
BILIRUBIN TOTAL: 0.2 mg/dL (ref 0.2–1.2)
BUN: 51 mg/dL — AB (ref 7–25)
CALCIUM: 8.1 mg/dL — AB (ref 8.6–10.2)
CO2: 21 mmol/L (ref 20–31)
CREATININE: 8.77 mg/dL — AB (ref 0.50–1.10)
Chloride: 99 mmol/L (ref 98–110)
GFR, Est African American: 6 mL/min — ABNORMAL LOW (ref >=60)
GFR, Est Non African American: 5 mL/min — ABNORMAL LOW (ref >=60)
GLUCOSE: 197 mg/dL — AB (ref 65–99)
POTASSIUM: 5.2 mmol/L (ref 3.5–5.3)
SODIUM: 137 mmol/L (ref 135–146)
TOTAL PROTEIN: 5.9 g/dL — AB (ref 6.1–8.1)

## 2015-04-17 MED ORDER — EPOETIN ALFA 20000 UNIT/ML IJ SOLN
INTRAMUSCULAR | Status: AC
  Administered 2015-04-17: 20000 [IU] via SUBCUTANEOUS
  Filled 2015-04-17: qty 1

## 2015-04-17 MED ORDER — EPOETIN ALFA 10000 UNIT/ML IJ SOLN
INTRAMUSCULAR | Status: AC
  Administered 2015-04-17: 10000 [IU] via SUBCUTANEOUS
  Filled 2015-04-17: qty 1

## 2015-04-17 MED ORDER — EPOETIN ALFA 40000 UNIT/ML IJ SOLN: 30000.0000 [IU] | INTRAMUSCULAR | Status: DC

## 2015-04-17 MED FILL — SODIUM BICARB 650 MG TABLET: 650 | 30 days supply | Qty: 125 | Fill #0

## 2015-04-19 ENCOUNTER — Encounter: Payer: Self-pay | Admitting: Family Medicine

## 2015-04-19 ENCOUNTER — Other Ambulatory Visit: Payer: Self-pay | Admitting: Family Medicine

## 2015-04-19 LAB — URINE CULTURE: Colony Count: >=100000

## 2015-04-20 ENCOUNTER — Encounter (HOSPITAL_COMMUNITY): Payer: Self-pay

## 2015-04-20 ENCOUNTER — Other Ambulatory Visit: Payer: Self-pay | Admitting: Family Medicine

## 2015-04-20 DIAGNOSIS — N390 Urinary tract infection, site not specified: Principal | ICD-10-CM

## 2015-04-20 DIAGNOSIS — A499 Bacterial infection, unspecified: Secondary | ICD-10-CM

## 2015-04-20 MED ORDER — AMOXICILLIN 250 MG PO CAPS: 250.0000 mg | ORAL_CAPSULE | Freq: Two times a day (BID) | ORAL | Status: DC

## 2015-04-20 MED FILL — AMOXICILLIN 250 MG CAPSULE: 250 | 10 days supply | Qty: 20 | Fill #0

## 2015-04-20 NOTE — Progress Notes (Signed)
Called and advised of UTI and to start amoxicillin 250mg  twice daily as directed for 10 days. Advised patient if she had any symptoms after completing medication to call back and make another appointment. Thanks!

## 2015-04-24 ENCOUNTER — Encounter (HOSPITAL_COMMUNITY)
Admission: RE | Admit: 2015-04-24 | Discharge: 2015-04-24 | Disposition: A | Discharge status: Home / Self Care | Payer: No Typology Code available for payment source | Source: Ambulatory Visit | Attending: Nephrology | Admitting: Nephrology

## 2015-04-24 ENCOUNTER — Ambulatory Visit (HOSPITAL_COMMUNITY)
Admission: RE | Admit: 2015-04-24 | Discharge: 2015-04-24 | Disposition: A | Discharge status: Home / Self Care | Payer: Medicaid Other | Source: Ambulatory Visit | Attending: Family Medicine | Admitting: Family Medicine

## 2015-04-24 DIAGNOSIS — N185 Chronic kidney disease, stage 5: Secondary | ICD-10-CM | POA: Diagnosis not present

## 2015-04-24 DIAGNOSIS — N921 Excessive and frequent menstruation with irregular cycle: Secondary | ICD-10-CM | POA: Diagnosis present

## 2015-04-24 LAB — IRON AND TIBC
IRON: 18 ug/dL — AB (ref 28–170)
SATURATION RATIOS: 6 % — AB (ref 10.4–31.8)
TIBC: 325 ug/dL (ref 250–450)
UIBC: 307 ug/dL

## 2015-04-24 LAB — POCT HEMOGLOBIN-HEMACUE: HEMOGLOBIN: 8.2 g/dL — AB (ref 12.0–15.0)

## 2015-04-24 MED ORDER — EPOETIN ALFA 10000 UNIT/ML IJ SOLN
INTRAMUSCULAR | Status: AC
  Administered 2015-04-24: 10000 [IU]
  Filled 2015-04-24: qty 1

## 2015-04-24 MED ORDER — EPOETIN ALFA 20000 UNIT/ML IJ SOLN
INTRAMUSCULAR | Status: AC
  Administered 2015-04-24: 20000 [IU]
  Filled 2015-04-24: qty 1

## 2015-04-24 MED ORDER — EPOETIN ALFA 40000 UNIT/ML IJ SOLN: 30000.0000 [IU] | INTRAMUSCULAR | Status: DC

## 2015-04-27 ENCOUNTER — Other Ambulatory Visit: Payer: Self-pay | Admitting: Family Medicine

## 2015-04-27 DIAGNOSIS — D259 Leiomyoma of uterus, unspecified: Secondary | ICD-10-CM

## 2015-04-27 DIAGNOSIS — D219 Benign neoplasm of connective and other soft tissue, unspecified: Secondary | ICD-10-CM | POA: Insufficient documentation

## 2015-04-27 NOTE — Progress Notes (Signed)
Patient returned call and I advised of results and that we are referring to gyn. Patient verbalized understanding. Thanks!

## 2015-04-27 NOTE — Progress Notes (Signed)
Called and left message asking patient to return call. Thanks!

## 2015-04-27 NOTE — Progress Notes (Signed)
Reviewed ultrasound of abdomen/pelvis:   1. Several small intramural appearing uterine fibroids, the largest is 3.3 cm at the ventral fundus. 2. Endometrium within normal limits. Physiologic appearance of both ovaries. 3. Trace simple appearing free fluid in the cul-de-sac likely is Physiologic  Will send a referral to gynecology for further evaluation.   Dorena Dew, FNP

## 2015-04-28 ENCOUNTER — Telehealth: Payer: Self-pay

## 2015-04-28 DIAGNOSIS — I10 Essential (primary) hypertension: Secondary | ICD-10-CM

## 2015-04-28 MED ORDER — METOPROLOL SUCCINATE ER 200 MG PO TB24: 200.0000 mg | ORAL_TABLET | Freq: Every day | ORAL | Status: DC

## 2015-04-28 MED FILL — METOPROLOL SUCC ER 100 MG T: 100 | 30 days supply | Qty: 60 | Fill #0

## 2015-04-28 NOTE — Telephone Encounter (Signed)
Refill for metoprolol sent into pharmacy. Thanks!  

## 2015-04-28 NOTE — Telephone Encounter (Signed)
Pt called and left message for a prescription refill on her medication metoprolol succinate. Thanks!

## 2015-04-30 ENCOUNTER — Other Ambulatory Visit (HOSPITAL_COMMUNITY): Payer: Self-pay

## 2015-05-01 ENCOUNTER — Ambulatory Visit (HOSPITAL_COMMUNITY)
Admission: RE | Admit: 2015-05-01 | Discharge: 2015-05-01 | Disposition: A | Discharge status: Home / Self Care | Payer: Medicaid Other | Source: Ambulatory Visit | Attending: Nephrology | Admitting: Nephrology

## 2015-05-01 DIAGNOSIS — N185 Chronic kidney disease, stage 5: Secondary | ICD-10-CM | POA: Insufficient documentation

## 2015-05-01 DIAGNOSIS — D509 Iron deficiency anemia, unspecified: Secondary | ICD-10-CM | POA: Insufficient documentation

## 2015-05-01 DIAGNOSIS — Z79899 Other long term (current) drug therapy: Secondary | ICD-10-CM | POA: Diagnosis not present

## 2015-05-01 DIAGNOSIS — D631 Anemia in chronic kidney disease: Secondary | ICD-10-CM | POA: Insufficient documentation

## 2015-05-01 DIAGNOSIS — Z5181 Encounter for therapeutic drug level monitoring: Secondary | ICD-10-CM | POA: Insufficient documentation

## 2015-05-01 LAB — POCT HEMOGLOBIN-HEMACUE: Hemoglobin: 8.2 g/dL — ABNORMAL LOW (ref 12.0–15.0)

## 2015-05-01 MED ORDER — SODIUM CHLORIDE 0.9 % IV SOLN
510.0000 mg | INTRAVENOUS | Status: DC
  Administered 2015-05-01: 510 mg via INTRAVENOUS
  Filled 2015-05-01: qty 17

## 2015-05-01 MED ORDER — EPOETIN ALFA 40000 UNIT/ML IJ SOLN: 30000.0000 [IU] | INTRAMUSCULAR | Status: DC

## 2015-05-01 MED ORDER — EPOETIN ALFA 10000 UNIT/ML IJ SOLN
INTRAMUSCULAR | Status: AC
  Administered 2015-05-01: 10000 [IU] via SUBCUTANEOUS
  Filled 2015-05-01: qty 1

## 2015-05-01 MED ORDER — EPOETIN ALFA 20000 UNIT/ML IJ SOLN
INTRAMUSCULAR | Status: AC
  Administered 2015-05-01: 20000 [IU] via SUBCUTANEOUS
  Filled 2015-05-01: qty 1

## 2015-05-01 NOTE — Discharge Instructions (Signed)

## 2015-05-04 ENCOUNTER — Telehealth: Payer: Self-pay

## 2015-05-07 ENCOUNTER — Other Ambulatory Visit (HOSPITAL_COMMUNITY): Payer: Self-pay | Admitting: *Deleted

## 2015-05-08 ENCOUNTER — Ambulatory Visit (HOSPITAL_COMMUNITY)
Admission: RE | Admit: 2015-05-08 | Discharge: 2015-05-08 | Disposition: A | Discharge status: Home / Self Care | Payer: Medicaid Other | Source: Ambulatory Visit | Attending: Nephrology | Admitting: Nephrology

## 2015-05-08 DIAGNOSIS — D631 Anemia in chronic kidney disease: Secondary | ICD-10-CM | POA: Insufficient documentation

## 2015-05-08 DIAGNOSIS — N185 Chronic kidney disease, stage 5: Secondary | ICD-10-CM | POA: Insufficient documentation

## 2015-05-08 DIAGNOSIS — Z79899 Other long term (current) drug therapy: Secondary | ICD-10-CM | POA: Insufficient documentation

## 2015-05-08 DIAGNOSIS — D509 Iron deficiency anemia, unspecified: Secondary | ICD-10-CM | POA: Insufficient documentation

## 2015-05-08 DIAGNOSIS — Z5181 Encounter for therapeutic drug level monitoring: Secondary | ICD-10-CM | POA: Diagnosis not present

## 2015-05-08 LAB — FERRITIN: Ferritin: 197 ng/mL (ref 11–307)

## 2015-05-08 MED ORDER — EPOETIN ALFA 20000 UNIT/ML IJ SOLN
INTRAMUSCULAR | Status: AC
  Administered 2015-05-08: 20000 [IU]
  Filled 2015-05-08: qty 1

## 2015-05-08 MED ORDER — EPOETIN ALFA 10000 UNIT/ML IJ SOLN
INTRAMUSCULAR | Status: AC
  Administered 2015-05-08: 10000 [IU]
  Filled 2015-05-08: qty 1

## 2015-05-08 MED ORDER — EPOETIN ALFA 40000 UNIT/ML IJ SOLN: 30000.0000 [IU] | INTRAMUSCULAR | Status: DC

## 2015-05-08 MED ORDER — SODIUM CHLORIDE 0.9 % IV SOLN
510.0000 mg | INTRAVENOUS | Status: AC
  Administered 2015-05-08: 510 mg via INTRAVENOUS
  Filled 2015-05-08: qty 17

## 2015-05-11 LAB — POCT HEMOGLOBIN-HEMACUE: HEMOGLOBIN: 8.8 g/dL — AB (ref 12.0–15.0)

## 2015-05-12 ENCOUNTER — Ambulatory Visit (HOSPITAL_COMMUNITY)
Admission: EM | Admit: 2015-05-12 | Discharge: 2015-05-12 | Disposition: A | Discharge status: Home / Self Care | Payer: Medicaid Other | Attending: Family Medicine | Admitting: Family Medicine

## 2015-05-12 ENCOUNTER — Encounter (HOSPITAL_COMMUNITY): Payer: Self-pay | Admitting: Emergency Medicine

## 2015-05-12 DIAGNOSIS — T148XXA Other injury of unspecified body region, initial encounter: Secondary | ICD-10-CM

## 2015-05-12 DIAGNOSIS — T148 Other injury of unspecified body region: Secondary | ICD-10-CM

## 2015-05-12 NOTE — ED Notes (Signed)
C/o left side hip pain onset Saturday... Denies inj/trauma... Reports she was doing new exercises and believes she may have pulled a muscle.... Slow gait.... A&O x4... No acute distress.

## 2015-05-12 NOTE — Discharge Instructions (Signed)
REST MOIST HEAT TO YOUR LEFT HIP  ACTIVITY AS TOLERATED  DO NOT USE MOTRIN, ONLY USE TYLENOL  SEE YOUR DOCTOR FOR FOLLOW UP

## 2015-05-13 NOTE — ED Provider Notes (Signed)
CSN: [id number]     Arrival date & time 05/12/15  1906 History   First MD Initiated Contact with Patient 05/12/15 2028     Chief Complaint  Patient presents with  . Hip Pain   (Consider location/radiation/quality/duration/timing/severity/associated sxs/prior Treatment) HPI Pt has been doing new exercises at home. strecthing and such. Now has pain in left hip. NO trauma or falls. Walking, and does not impair normal activity. She is very uncomfortable lifting leg into the air.  Past Medical History  Diagnosis Date  . Hypertension   . Hyperlipidemia   . Diabetes mellitus   . Pneumonia 07/04/2013  . CKD (chronic kidney disease), stage III   . Normocytic anemia   . Chest pain     a. 2D echo 01/2010 - mild TR, LVEF 60% with normal wall thickness, trace MR, mild TR, normal RV, trace pericardial effusion. b. [facility] nuc 03/2011 at [facility].  . Hypoalbuminemia   . CHF (congestive heart failure) ([place])   . Heart murmur     as a child  . GERD (gastroesophageal reflux disease)   . Diabetic retinopathy [facility])    Past Surgical History  Procedure Laterality Date  . Tonsillectomy  1980  . Cardiac catheterization  ~ 2008    negative, no intervention needed  . Tubal ligation  1995  . Eye surgery      eye injections  . Dilation and curettage of uterus      x 3  . Breast surgery      lumpectomy  . Bascilic vein transposition Left 03/04/2015    Procedure: BASILIC VEIN TRANSPOSITION;  Surgeon: Elam Dutch, MD;  Location: [facility] OR;  Service: Vascular;  Laterality: Left;   Family History  Problem Relation Age of Onset  . Malignant hyperthermia Mother   . Diabetes Mellitus II Mother   . Heart attack Father     Age 57  . Kidney disease Father   . Diabetes Mellitus II Sister   . Hypertension Sister    Social History  Substance Use Topics  . Smoking status: Never Smoker   . Smokeless tobacco: Never Used  . Alcohol Use: Yes     Comment: occasional drink of wine - 1 glass/week or less   OB History     No data available     Review of Systems Left hip pain Allergies  Review of patient's allergies indicates no known allergies.  Home Medications   Prior to Admission medications   Medication Sig Start Date End Date Taking? Authorizing Provider  aspirin EC 81 MG tablet Take 81 mg by mouth daily.   Yes Historical Provider, MD  atorvastatin (LIPITOR) 80 MG tablet Take 1 tablet (80 mg total) by mouth daily. 11/13/14  Yes Nicole Pisciotta, PA-C  calcitRIOL (ROCALTROL) 0.25 MCG capsule Take 0.5 mcg by mouth daily.    Yes Historical Provider, MD  calcium acetate (PHOSLO) 667 MG capsule Take 667 mg by mouth 3 (three) times daily with meals.   Yes Historical Provider, MD  furosemide (LASIX) 40 MG tablet Take 40 mg by mouth daily. Reported on 04/16/2015   Yes Historical Provider, MD  glipiZIDE (GLUCOTROL) 10 MG tablet Take 1 tablet (10 mg total) by mouth 2 (two) times daily before a meal. 01/22/15  Yes Dorena Dew, FNP  Insulin Glargine (LANTUS SOLOSTAR) 100 UNIT/ML Solostar Pen Inject 10 Units into the skin daily at 10 pm. 02/06/15  Yes Dorena Dew, FNP  metoprolol (TOPROL-XL) 200 MG 24 hr tablet Take  1 tablet (200 mg total) by mouth daily. Take with or immediately following a meal. 04/28/15  Yes Dorena Dew, FNP  sodium bicarbonate 650 MG tablet Take 1,300 mg by mouth 2 (two) times daily.   Yes Historical Provider, MD  acetaminophen (TYLENOL) 500 MG tablet Take 2 tablets (1,000 mg total) by mouth every 6 (six) hours as needed for moderate pain. Patient not taking: Reported on 04/09/2015 09/30/14   Domenic Moras, PA-C  acetaminophen-codeine (TYLENOL #3) 300-30 MG tablet Take 1 tablet by mouth every 6 (six) hours as needed for moderate pain. Patient not taking: Reported on 04/09/2015 02/06/15   Dorena Dew, FNP  amoxicillin (AMOXIL) 250 MG capsule Take 1 capsule (250 mg total) by mouth 2 (two) times daily. 04/20/15   Dorena Dew, FNP  benzonatate (TESSALON PERLES) 100 MG capsule Take 1  capsule (100 mg total) by mouth 3 (three) times daily as needed for cough. Patient not taking: Reported on 04/09/2015 03/10/15   Dorena Dew, FNP  blood glucose meter kit and supplies KIT Dispense based on patient and insurance preference. Use up to four times daily as directed. (FOR ICD-9 250.00, 250.01). 11/13/14   Nicole Pisciotta, PA-C  docusate sodium (COLACE) 100 MG capsule Take 1 capsule (100 mg total) by mouth 2 (two) times daily. 03/06/15   Dorena Dew, FNP  ferrous sulfate 325 (65 FE) MG tablet Take 1 tablet (325 mg total) by mouth daily. Patient not taking: Reported on 04/09/2015 03/27/15   Blanchie Dessert, MD  glucose blood (TRUE METRIX BLOOD GLUCOSE TEST) test strip Use as instructed 01/02/15   Dorena Dew, FNP  Insulin Pen Needle (BD PEN NEEDLE NANO U/F) 32G X 4 MM MISC Use 1x a day 07/18/14   Linton Flemings, MD  nitroGLYCERIN (NITROSTAT) 0.4 MG SL tablet Place 1 tablet (0.4 mg total) under the tongue every 5 (five) minutes as needed. For chest pain 11/13/14   Monico Blitz, PA-C  oxyCODONE-acetaminophen (PERCOCET/ROXICET) 5-325 MG tablet Take 1-2 tablets by mouth every 6 (six) hours as needed. Patient not taking: Reported on 04/09/2015 03/04/15   Elam Dutch, MD   Meds Ordered and Administered this Visit  Medications - No data to display  BP 196/72 mmHg  Pulse 69  Temp(Src) 98 F (36.7 C) (Oral)  Resp 18  SpO2 100%  LMP 05/02/2015 No data found.   Physical Exam NURSES NOTES AND VITAL SIGNS REVIEWED. CONSTITUTIONAL: Well developed, well nourished, no acute distress HEENT: normocephalic, atraumatic EYES: Conjunctiva normal NECK:normal ROM, supple, no adenopathy PULMONARY:No respiratory distress, normal effort MUSCULOSKELETAL: Normal ROM of all extremities, Left Hip some palpable discomfort, not tender mostly in the gluteal on left.  SKIN: warm and dry without rash PSYCHIATRIC: Mood and affect, behavior are normal  ED Course  Procedures (including critical care  time)  Labs Review Labs Reviewed - No data to display  Imaging Review No results found.   Visual Acuity Review  Right Eye Distance:   Left Eye Distance:   Bilateral Distance:    Right Eye Near:   Left Eye Near:    Bilateral Near:      Limitation to tylenol due to renal failure  MDM   1. Pulled muscle     Patient is reassured that there are no issues that require transfer to higher level of care at this time or additional tests. Patient is advised to continue home symptomatic treatment. Patient is advised that if there are new or worsening symptoms to  attend the emergency department, contact primary care provider, or return to UC. Instructions of care provided discharged home in stable condition.    THIS NOTE WAS GENERATED USING A VOICE RECOGNITION SOFTWARE PROGRAM. ALL REASONABLE EFFORTS  WERE MADE TO PROOFREAD THIS DOCUMENT FOR ACCURACY.  I have verbally reviewed the discharge instructions with the patient. A printed AVS was given to the patient.  All questions were answered prior to discharge.      Konrad Felix, PA 05/13/15 1043

## 2015-05-13 NOTE — Progress Notes (Signed)
This encounter was created in error - please disregard.

## 2015-05-15 ENCOUNTER — Ambulatory Visit (HOSPITAL_COMMUNITY)
Admission: RE | Admit: 2015-05-15 | Discharge: 2015-05-15 | Disposition: A | Discharge status: Home / Self Care | Payer: Medicaid Other | Source: Ambulatory Visit | Attending: Nephrology | Admitting: Nephrology

## 2015-05-15 DIAGNOSIS — Z5181 Encounter for therapeutic drug level monitoring: Secondary | ICD-10-CM | POA: Insufficient documentation

## 2015-05-15 DIAGNOSIS — D631 Anemia in chronic kidney disease: Secondary | ICD-10-CM | POA: Insufficient documentation

## 2015-05-15 DIAGNOSIS — N185 Chronic kidney disease, stage 5: Secondary | ICD-10-CM | POA: Insufficient documentation

## 2015-05-15 DIAGNOSIS — Z79899 Other long term (current) drug therapy: Secondary | ICD-10-CM | POA: Diagnosis not present

## 2015-05-15 LAB — POCT HEMOGLOBIN-HEMACUE: HEMOGLOBIN: 9.6 g/dL — AB (ref 12.0–15.0)

## 2015-05-15 MED ORDER — EPOETIN ALFA 10000 UNIT/ML IJ SOLN
INTRAMUSCULAR | Status: AC
  Administered 2015-05-15: 10000 [IU]
  Filled 2015-05-15: qty 1

## 2015-05-15 MED ORDER — EPOETIN ALFA 20000 UNIT/ML IJ SOLN
INTRAMUSCULAR | Status: AC
  Administered 2015-05-15: 20000 [IU]
  Filled 2015-05-15: qty 1

## 2015-05-15 MED ORDER — EPOETIN ALFA 40000 UNIT/ML IJ SOLN: 30000.0000 [IU] | INTRAMUSCULAR | Status: DC

## 2015-05-20 ENCOUNTER — Telehealth: Payer: Self-pay

## 2015-05-21 ENCOUNTER — Encounter: Payer: Self-pay | Admitting: Family Medicine

## 2015-05-21 ENCOUNTER — Ambulatory Visit (INDEPENDENT_AMBULATORY_CARE_PROVIDER_SITE_OTHER): Payer: Medicaid Other | Admitting: Family Medicine

## 2015-05-21 VITALS — BP 166/72 | HR 57 | Temp 98.3°F | Ht 67.0 in | Wt 174.0 lb

## 2015-05-21 DIAGNOSIS — M25552 Pain in left hip: Secondary | ICD-10-CM | POA: Diagnosis not present

## 2015-05-21 DIAGNOSIS — E1165 Type 2 diabetes mellitus with hyperglycemia: Secondary | ICD-10-CM

## 2015-05-21 DIAGNOSIS — I10 Essential (primary) hypertension: Secondary | ICD-10-CM | POA: Diagnosis not present

## 2015-05-21 DIAGNOSIS — Z794 Long term (current) use of insulin: Secondary | ICD-10-CM

## 2015-05-21 DIAGNOSIS — N185 Chronic kidney disease, stage 5: Secondary | ICD-10-CM | POA: Diagnosis not present

## 2015-05-21 LAB — POCT URINALYSIS DIP (DEVICE)
Bilirubin Urine: NEGATIVE
GLUCOSE, UA: NEGATIVE mg/dL
Ketones, ur: NEGATIVE mg/dL
NITRITE: NEGATIVE
Protein, ur: >=300 mg/dL — AB
Specific Gravity, Urine: 1.02 (ref 1.005–1.030)
Urobilinogen, UA: 0.2 mg/dL (ref 0.0–1.0)
pH: 6.5 (ref 5.0–8.0)

## 2015-05-21 NOTE — Progress Notes (Signed)
Subjective:    Patient ID: Jade Bond, female    DOB: 09-Dec-1965, 50 y.o.   MRN: [id number]  HPI [name], a 50 year old female presents for a follow up of uncontrolled hypertension and diabetes. [name] also has a history of uncontrolled hypertension and stage 5 chronic kidney disease. She is followed by Dr. Jeneen Rinks Deterding, nephrologist for end stage kidney disease.    She states that she has not taken antihypertensive medication today. She last took Metoprolol on last night.  She maintains that she does not exercise or follow a low fat diet. She has been consistent with taking medications. She reports that she does not smoke, but she has smokers living in her home. She states that she has had chest pains in the past. She currently denies chest pains, heart palpitations, or lower extremity edema. Previous urine studies indicated microalbuminuria.   Patient also has a history of type 2 diabetes mellitus. Previous hemoglobin a1C was 6.7%, which is reduced from 7.7 five months ago. She has been following a low carbohydrate diet. Symptoms have decreased since starting current medication regimen. She has been checking blood sugars consistently at home. B  Evaluation to date has been included: fasting lipid panel, hemoglobin A1C and microalbuminuria.    Past Medical History  Diagnosis Date  . Hypertension   . Hyperlipidemia   . Diabetes mellitus   . Pneumonia 07/04/2013  . CKD (chronic kidney disease), stage III   . Normocytic anemia   . Chest pain     a. 2D echo 01/2010 - mild TR, LVEF 60% with normal wall thickness, trace MR, mild TR, normal RV, trace pericardial effusion. b. Normal nuc 03/2011 at [facility].  . Hypoalbuminemia   . CHF (congestive heart failure) ([place])   . Heart murmur     as a child  . GERD (gastroesophageal reflux disease)   . Diabetic retinopathy ([place])    Immunization History  Administered Date(s) Administered  . Influenza Split 03/19/2011, 11/24/2011  .  Influenza,inj,Quad PF,36+ Mos 11/27/2014  . Pneumococcal Polysaccharide-23 03/19/2011  No Known Allergies Social History   Social History  . Marital Status: Legally Separated    Spouse Name: N/A  . Number of Children: N/A  . Years of Education: N/A   Occupational History  .      Food Therapist, nutritional   Social History Main Topics  . Smoking status: Never Smoker   . Smokeless tobacco: Never Used  . Alcohol Use: Yes     Comment: occasional drink of wine - 1 glass/week or less  . Drug Use: No  . Sexual Activity: Not on file   Other Topics Concern  . Not on file   Social History Narrative   Review of Systems  Constitutional: Negative.   Eyes: Negative for photophobia.  Respiratory: Negative for apnea and cough.   Cardiovascular: Negative.   Endocrine: Negative for polydipsia, polyphagia and polyuria.  Genitourinary: Negative.   Skin: Negative.   Allergic/Immunologic: Negative.   Neurological: Negative.   Hematological: Negative.   Psychiatric/Behavioral: Negative.  Negative for suicidal ideas.       Objective:   Physical Exam  Constitutional: She is oriented to person, place, and time. She appears well-developed and well-nourished.  HENT:  Head: Normocephalic and atraumatic.  Right Ear: External ear normal.  Left Ear: External ear normal.  Nose: Nose normal.  Mouth/Throat: Oropharynx is clear and moist.  Eyes: Conjunctivae and EOM are normal. Pupils are equal, round, and reactive to  light.  Neck: Normal range of motion. Neck supple.  Cardiovascular: Normal rate, regular rhythm, normal heart sounds and intact distal pulses.   Pulmonary/Chest: Effort normal and breath sounds normal.  Abdominal: Soft. Bowel sounds are normal.  Neurological: She is alert and oriented to person, place, and time. She has normal reflexes.  Skin: Skin is warm, dry and intact. No rash noted.  Fistula to left upper arm    Psychiatric: She has a normal mood and affect. Her behavior is  normal. Judgment and thought content normal.     BP 160/72 mmHg  Pulse 57  Temp(Src) 98.3 F (36.8 C) (Oral)  Ht 5\' 7"  (1.702 Bond)  Wt 174 lb (78.926 kg)  BMI 27.25 kg/m2  SpO2 100%  LMP 05/02/2015 Assessment & Plan:  1. Essential hypertension Blood pressure is not at goal on current medication regimen. Patient has not taken bp medications since last night. Blood pressure has improved from previous visit.   -urinalysis complete  2. Uncontrolled type 2 diabetes mellitus with hyperglycemia, with long-term current use of insulin (HCC) Continue Lantus 10 units HS. Also, patient is to bring glucometer to next appointment. There have been some non adherence issues here. I have discussed with her the great importance of following the treatment plan exactly as directed in order to achieve a good medical outcome.   3. CKD (chronic kidney disease) stage 5, GFR less than 15 ml/min (HCC) Continue to follow with  [facility], Dr. Jimmy Footman as scheduled. She is also receiving Epein alfa per Dr. Jimmy Footman.  -Urinalysis complete   RTC: 3 months for hypertension and diabetes mellitus   Jade Dorian M, FNP   The patient was given clear instructions to go to ER or return to medical center if symptoms do not improve, worsen or new problems develop. The patient verbalized understanding. Will notify patient with laboratory results.

## 2015-05-21 NOTE — Patient Instructions (Signed)
Chronic Kidney Disease °Chronic kidney disease occurs when the kidneys are damaged over a long period. The kidneys are two organs that lie on either side of the spine between the middle of the back and the front of the abdomen. The kidneys: °· Remove wastes and extra water from the blood. °· Produce important hormones. These help keep bones strong, regulate blood pressure, and help create red blood cells. °· Balance the fluids and chemicals in the blood and tissues. °A small amount of kidney damage may not cause problems, but a large amount of damage may make it difficult or impossible for the kidneys to work the way they should. If steps are not taken to slow down the kidney damage or stop it from getting worse, the kidneys may stop working permanently. Most of the time, chronic kidney disease does not go away. However, it can often be controlled, and those with the disease can usually live normal lives. °CAUSES °The most common causes of chronic kidney disease are diabetes and high blood pressure (hypertension). Chronic kidney disease may also be caused by: °· Diseases that cause the kidneys' filters to become inflamed. °· Diseases that affect the immune system. °· Genetic diseases. °· Medicines that damage the kidneys, such as anti-inflammatory medicines. °· Poisoning or exposure to toxic substances. °· A reoccurring kidney or urinary infection. °· A problem with urine flow. This may be caused by: °· Cancer. °· Kidney stones. °· An enlarged prostate in males. °SIGNS AND SYMPTOMS °Because the kidney damage in chronic kidney disease occurs slowly, symptoms develop slowly and may not be obvious until the kidney damage becomes severe. A person may have a kidney disease for years without showing any symptoms. Symptoms can include: °· Swelling (edema) of the legs, ankles, or feet. °· Tiredness (lethargy). °· Nausea or vomiting. °· Confusion. °· Problems with urination, such as: °· Decreased urine  production. °· Frequent urination, especially at night. °· Frequent accidents in children who are potty trained. °· Muscle twitches and cramps. °· Shortness of breath. °· Weakness. °· Persistent itchiness. °· Loss of appetite. °· Metallic taste in the mouth. °· Trouble sleeping. °· Slowed development in children. °· Short stature in children. °DIAGNOSIS °Chronic kidney disease may be detected and diagnosed by tests, including blood, urine, imaging, or kidney biopsy tests. °TREATMENT °Most chronic kidney diseases cannot be cured. Treatment usually involves relieving symptoms and preventing or slowing the progression of the disease. Treatment may include: °· A special diet. You may need to avoid alcohol and foods that are salty and high in potassium. °· Medicines. These may: °· Lower blood pressure. °· Relieve anemia. °· Relieve swelling. °· Protect the bones. °HOME CARE INSTRUCTIONS °· Follow your prescribed diet.  Your health care provider may instruct you to limit daily salt (sodium) and protein intake. °· Take medicines only as directed by your health care provider. Do not take any new medicines (prescription, over-the-counter, or nutritional supplements) unless approved by your health care provider. Many medicines can worsen your kidney damage or need to have the dose adjusted.   °· Quit smoking if you smoke. Talk to your health care provider about a smoking cessation program. °· Keep all follow-up visits as directed by your health care provider. °· Monitor your blood pressure. °· Start or continue an exercise plan. °· Get immunizations as directed by your health care provider. °· Take vitamin and mineral supplements as directed by your health care provider. °SEEK IMMEDIATE MEDICAL CARE IF: °· Your symptoms get worse or you develop   new symptoms.  You develop symptoms of end-stage kidney disease. These include:  Headaches.  Abnormally dark or light skin.  Numbness in the hands or feet.  Easy  bruising.  Frequent hiccups.  Menstruation stops.  You have a fever.  You have decreased urine production.  You havepain or bleeding when urinating. MAKE SURE YOU:  Understand these instructions.  Will watch your condition.  Will get help right away if you are not doing well or get worse. FOR MORE INFORMATION   American Association of Kidney Patients: BombTimer.gl  National Kidney Foundation: www.kidney.[place]: https://mathis.com/  [facility]: www.lifeoptions.org and www.kidneyschool.org   This information is not intended to replace advice given to you by your health care provider. Make sure you discuss any questions you have with your health care provider.   Document Released: 10/27/2007 Document Revised: 02/07/2014 Document Reviewed: 09/16/2011 Elsevier Interactive Patient Education 2016 [place] for Chronic Kidney Disease When your kidneys are not working well, they cannot remove waste and excess substances from your blood as effectively as they did before. This can lead to a buildup and imbalance of these substances, which can affect how your body functions. This buildup can also make your kidneys work harder, causing even more damage. You may need to eat less of certain foods that can lead to the buildup of these substances in your body. By making the changes to your diet that are recommended by your dietitian or health care provider, you could possibly help prevent further kidney damage and delay or prevent the need for dialysis. The following information can help give you a basic understanding of these substances and how they affect your bodily functions. The information also gives examples of foods that contain the highest amounts of these substances. WHAT DO I NEED TO KNOW ABOUT SUBSTANCES IN MY FOOD THAT I MAY NEED TO ADJUST? Food adjustments will be different for each person with chronic kidney disease. It is  important that you see a dietitian who can help you determine the specific adjustments that you will need to make for each of the following substances: Potassium Potassium affects how steadily your heart beats. If too much potassium builds up in your blood, it can cause an irregular heartbeat or even a heart attack. Examples of foods rich in potassium include:  Milk.  Fruits.  Vegetables. Phosphorus Phosphorus is a mineral found in your bones. A balance between calcium and phosphorous is needed to build and maintain healthy bones. Too much phosphorus pulls calcium from your bones. This can make your bones weak and more likely to break. Too much phosphorus can also make your skin itch. Examples of foods rich in phosphorus include:  Milk and cheese.  Dried beans.  Peas.  Colas.  Nuts and peanut butter. Animal Protein Animal protein helps you make and keep muscle. It also helps in the repair of your body's cells and tissues. One of the natural breakdown products of protein is a waste product called urea. When your kidneys are not working properly, they cannot remove wastes such as urea like they did before you developed chronic kidney disease. You will likely need to limit the amount of protein you eat to help prevent a buildup of urea in your blood. Examples of animal protein include:  Meat (all types).  Fish and seafood.  Poultry.  Eggs. Sodium Sodium, which is found in salt, helps maintain a healthy balance of fluids in your body. Too much sodium  can increase your blood pressure level and have a negative affect on the function of your heart and lungs. Too much sodium also can cause your body to retain too much fluid, making your kidneys work harder. Examples of foods with high levels of sodium include:  Salt seasonings.  Soy sauce.  Cured and processed meats.  Salted crackers and snack foods.  Fast food.  Canned soups and most canned foods. Glucose Glucose provides  energy for your body. If you have diabetes mellitus that is not properly controlled, you have too much glucose in your blood. Too much glucose in your blood can worsen the function of your kidneys by damaging small blood vessels. This prevents enough blood flow to your kidneys to give them what they need to work. If you have diabetes mellitus and chronic kidney disease, it is important to maintain your blood glucose at a level recommended by your health care provider. SHOULD I TAKE A VITAMIN AND MINERAL SUPPLEMENT? Because you may need to avoid eating certain foods, you may not get all of the vitamins and minerals that would normally come from those foods. Your health care provider or dietitian may recommend that you take a supplement to ensure that you get all of the vitamins and minerals that your body needs.    This information is not intended to replace advice given to you by your health care provider. Make sure you discuss any questions you have with your health care provider.   Document Released: 04/09/2002 Document Revised: 02/07/2014 Document Reviewed: 12/14/2012 Elsevier Interactive Patient Education 2016 [place] DASH stands for "Dietary Approaches to Stop Hypertension." The DASH eating plan is a healthy eating plan that has been shown to reduce high blood pressure (hypertension). Additional health benefits may include reducing the risk of type 2 diabetes mellitus, heart disease, and stroke. The DASH eating plan may also help with weight loss. WHAT DO I NEED TO KNOW ABOUT THE DASH EATING PLAN? For the DASH eating plan, you will follow these general guidelines:  Choose foods with a percent daily value for sodium of less than 5% (as listed on the food label).  Use salt-free seasonings or herbs instead of table salt or sea salt.  Check with your health care provider or pharmacist before using salt substitutes.  Eat lower-sodium products, often labeled as "lower  sodium" or "no salt added."  Eat fresh foods.  Eat more vegetables, fruits, and low-fat dairy products.  Choose whole grains. Look for the word "whole" as the first word in the ingredient list.  Choose fish and skinless chicken or [place] more often than red meat. Limit fish, poultry, and meat to 6 oz (170 g) each day.  Limit sweets, desserts, sugars, and sugary drinks.  Choose heart-healthy fats.  Limit cheese to 1 oz (28 g) per day.  Eat more home-cooked food and less restaurant, buffet, and fast food.  Limit fried foods.  Cook foods using methods other than frying.  Limit canned vegetables. If you do use them, rinse them well to decrease the sodium.  When eating at a restaurant, ask that your food be prepared with less salt, or no salt if possible. WHAT FOODS CAN I EAT? Seek help from a dietitian for individual calorie needs. Grains Whole grain or whole wheat bread. Brown rice. Whole grain or whole wheat pasta. Quinoa, bulgur, and whole grain cereals. Low-sodium cereals. Corn or whole wheat flour tortillas. Whole grain cornbread. Whole grain crackers. Low-sodium crackers. Vegetables Fresh  or frozen vegetables (raw, steamed, roasted, or grilled). Low-sodium or reduced-sodium tomato and vegetable juices. Low-sodium or reduced-sodium tomato sauce and paste. Low-sodium or reduced-sodium canned vegetables.  Fruits All fresh, canned (in natural juice), or frozen fruits. Meat and Other Protein Products Ground beef (85% or leaner), grass-fed beef, or beef trimmed of fat. Skinless chicken or [place]. Ground chicken or [place]. Pork trimmed of fat. All fish and seafood. Eggs. Dried beans, peas, or lentils. Unsalted nuts and seeds. Unsalted canned beans. Dairy Low-fat dairy products, such as skim or 1% milk, 2% or reduced-fat cheeses, low-fat ricotta or cottage cheese, or plain low-fat yogurt. Low-sodium or reduced-sodium cheeses. Fats and Oils Tub margarines without trans fats. Light or  reduced-fat mayonnaise and salad dressings (reduced sodium). Avocado. Safflower, olive, or canola oils. Natural peanut or almond butter. Other Unsalted popcorn and pretzels. The items listed above may not be a complete list of recommended foods or beverages. Contact your dietitian for more options. WHAT FOODS ARE NOT RECOMMENDED? Grains White bread. White pasta. White rice. Refined cornbread. Bagels and croissants. Crackers that contain trans fat. Vegetables Creamed or fried vegetables. Vegetables in a cheese sauce. Regular canned vegetables. Regular canned tomato sauce and paste. Regular tomato and vegetable juices. Fruits Dried fruits. Canned fruit in light or heavy syrup. Fruit juice. Meat and Other Protein Products Fatty cuts of meat. Ribs, chicken wings, bacon, sausage, bologna, salami, chitterlings, fatback, hot dogs, bratwurst, and packaged luncheon meats. Salted nuts and seeds. Canned beans with salt. Dairy Whole or 2% milk, cream, half-and-half, and cream cheese. Whole-fat or sweetened yogurt. Full-fat cheeses or blue cheese. Nondairy creamers and whipped toppings. Processed cheese, cheese spreads, or cheese curds. Condiments Onion and garlic salt, seasoned salt, table salt, and sea salt. Canned and packaged gravies. Worcestershire sauce. Tartar sauce. Barbecue sauce. Teriyaki sauce. Soy sauce, including reduced sodium. Steak sauce. Fish sauce. Oyster sauce. Cocktail sauce. Horseradish. Ketchup and mustard. Meat flavorings and tenderizers. Bouillon cubes. Hot sauce. Tabasco sauce. Marinades. Taco seasonings. Relishes. Fats and Oils Butter, stick margarine, lard, shortening, ghee, and bacon fat. Coconut, palm kernel, or palm oils. Regular salad dressings. Other Pickles and olives. Salted popcorn and pretzels. The items listed above may not be a complete list of foods and beverages to avoid. Contact your dietitian for more information. WHERE CAN I FIND MORE INFORMATION? National Heart,  Lung, and Blood Institute: travelstabloid.com   This information is not intended to replace advice given to you by your health care provider. Make sure you discuss any questions you have with your health care provider.   Document Released: 01/06/2011 Document Revised: 02/07/2014 Document Reviewed: 11/21/2012 Elsevier Interactive Patient Education [employer].

## 2015-05-22 ENCOUNTER — Encounter (HOSPITAL_COMMUNITY)
Admission: RE | Admit: 2015-05-22 | Discharge: 2015-05-22 | Disposition: A | Discharge status: Home / Self Care | Payer: Medicaid Other | Source: Ambulatory Visit | Attending: Nephrology | Admitting: Nephrology

## 2015-05-22 DIAGNOSIS — N185 Chronic kidney disease, stage 5: Secondary | ICD-10-CM | POA: Diagnosis not present

## 2015-05-22 DIAGNOSIS — Z79899 Other long term (current) drug therapy: Secondary | ICD-10-CM | POA: Insufficient documentation

## 2015-05-22 DIAGNOSIS — D631 Anemia in chronic kidney disease: Secondary | ICD-10-CM | POA: Insufficient documentation

## 2015-05-22 DIAGNOSIS — Z5181 Encounter for therapeutic drug level monitoring: Secondary | ICD-10-CM | POA: Diagnosis not present

## 2015-05-22 LAB — IRON AND TIBC
IRON: 71 ug/dL (ref 28–170)
Saturation Ratios: 29 % (ref 10.4–31.8)
TIBC: 242 ug/dL — ABNORMAL LOW (ref 250–450)
UIBC: 171 ug/dL

## 2015-05-22 LAB — POCT HEMOGLOBIN-HEMACUE: HEMOGLOBIN: 10.1 g/dL — AB (ref 12.0–15.0)

## 2015-05-22 MED ORDER — EPOETIN ALFA 20000 UNIT/ML IJ SOLN
INTRAMUSCULAR | Status: AC
  Administered 2015-05-22: 20000 [IU]
  Filled 2015-05-22: qty 1

## 2015-05-22 MED ORDER — EPOETIN ALFA 10000 UNIT/ML IJ SOLN
INTRAMUSCULAR | Status: AC
  Administered 2015-05-22: 10000 [IU]
  Filled 2015-05-22: qty 1

## 2015-05-22 MED ORDER — EPOETIN ALFA 40000 UNIT/ML IJ SOLN: 30000.0000 [IU] | INTRAMUSCULAR | Status: DC

## 2015-05-28 ENCOUNTER — Other Ambulatory Visit (HOSPITAL_COMMUNITY): Payer: Self-pay | Admitting: *Deleted

## 2015-05-28 NOTE — Progress Notes (Signed)
This encounter was created in error - please disregard.

## 2015-05-29 ENCOUNTER — Encounter (HOSPITAL_COMMUNITY)
Admission: RE | Admit: 2015-05-29 | Discharge: 2015-05-29 | Disposition: A | Discharge status: Home / Self Care | Payer: Medicaid Other | Source: Ambulatory Visit | Attending: Nephrology | Admitting: Nephrology

## 2015-05-29 DIAGNOSIS — N185 Chronic kidney disease, stage 5: Secondary | ICD-10-CM | POA: Diagnosis not present

## 2015-05-29 LAB — POCT HEMOGLOBIN-HEMACUE: HEMOGLOBIN: 11.1 g/dL — AB (ref 12.0–15.0)

## 2015-05-29 MED ORDER — EPOETIN ALFA 40000 UNIT/ML IJ SOLN: 30000.0000 [IU] | INTRAMUSCULAR | Status: DC

## 2015-05-29 MED ORDER — SODIUM CHLORIDE 0.9 % IV SOLN
510.0000 mg | Freq: Once | INTRAVENOUS | Status: AC
  Administered 2015-05-29: 510 mg via INTRAVENOUS
  Filled 2015-05-29: qty 17

## 2015-05-29 MED ORDER — EPOETIN ALFA 20000 UNIT/ML IJ SOLN
INTRAMUSCULAR | Status: AC
  Administered 2015-05-29: 20000 [IU]
  Filled 2015-05-29: qty 1

## 2015-05-29 MED ORDER — EPOETIN ALFA 10000 UNIT/ML IJ SOLN
INTRAMUSCULAR | Status: AC
  Administered 2015-05-29: 10000 [IU]
  Filled 2015-05-29: qty 1

## 2015-06-05 ENCOUNTER — Encounter (HOSPITAL_COMMUNITY)
Admission: RE | Admit: 2015-06-05 | Discharge: 2015-06-05 | Disposition: A | Discharge status: Home / Self Care | Payer: Medicaid Other | Source: Ambulatory Visit | Attending: Nephrology | Admitting: Nephrology

## 2015-06-05 DIAGNOSIS — Z5181 Encounter for therapeutic drug level monitoring: Secondary | ICD-10-CM | POA: Insufficient documentation

## 2015-06-05 DIAGNOSIS — Z79899 Other long term (current) drug therapy: Secondary | ICD-10-CM | POA: Diagnosis not present

## 2015-06-05 DIAGNOSIS — N185 Chronic kidney disease, stage 5: Secondary | ICD-10-CM | POA: Diagnosis not present

## 2015-06-05 DIAGNOSIS — D631 Anemia in chronic kidney disease: Secondary | ICD-10-CM | POA: Diagnosis not present

## 2015-06-05 LAB — FERRITIN: FERRITIN: 208 ng/mL (ref 11–307)

## 2015-06-05 LAB — POCT HEMOGLOBIN-HEMACUE: Hemoglobin: 12.2 g/dL (ref 12.0–15.0)

## 2015-06-05 MED ORDER — EPOETIN ALFA 40000 UNIT/ML IJ SOLN: 30000.0000 [IU] | INTRAMUSCULAR | Status: DC

## 2015-06-15 MED FILL — SODIUM BICARB 650 MG TABLET: 650 | 30 days supply | Qty: 120 | Fill #0

## 2015-06-15 MED FILL — KIONEX 15 GM/60 ML SUS: 15 | 1 days supply | Qty: 200 | Fill #0

## 2015-06-19 ENCOUNTER — Inpatient Hospital Stay (HOSPITAL_COMMUNITY): Admission: RE | Admit: 2015-06-19 | Payer: Medicaid Other | Source: Ambulatory Visit

## 2015-06-24 MED FILL — ?FUROSEMIDE 40 MG TABLET: 40 | 31 days supply | Qty: 31 | Fill #2 | Status: TO

## 2015-06-24 MED FILL — METOPROLOL SUCC ER 100 MG T: 100 | 30 days supply | Qty: 60 | Fill #1

## 2015-06-25 ENCOUNTER — Encounter (HOSPITAL_COMMUNITY): Payer: Self-pay | Admitting: Emergency Medicine

## 2015-06-25 ENCOUNTER — Emergency Department (HOSPITAL_COMMUNITY)
Admission: EM | Admit: 2015-06-25 | Discharge: 2015-06-25 | Disposition: A | Discharge status: Home / Self Care | Payer: Medicaid Other | Attending: Emergency Medicine | Admitting: Emergency Medicine

## 2015-06-25 DIAGNOSIS — R011 Cardiac murmur, unspecified: Secondary | ICD-10-CM | POA: Insufficient documentation

## 2015-06-25 DIAGNOSIS — N939 Abnormal uterine and vaginal bleeding, unspecified: Secondary | ICD-10-CM | POA: Insufficient documentation

## 2015-06-25 DIAGNOSIS — N183 Chronic kidney disease, stage 3 (moderate): Secondary | ICD-10-CM | POA: Diagnosis not present

## 2015-06-25 DIAGNOSIS — I509 Heart failure, unspecified: Secondary | ICD-10-CM | POA: Insufficient documentation

## 2015-06-25 DIAGNOSIS — E11319 Type 2 diabetes mellitus with unspecified diabetic retinopathy without macular edema: Secondary | ICD-10-CM | POA: Diagnosis not present

## 2015-06-25 DIAGNOSIS — Z9889 Other specified postprocedural states: Secondary | ICD-10-CM | POA: Insufficient documentation

## 2015-06-25 DIAGNOSIS — Z8701 Personal history of pneumonia (recurrent): Secondary | ICD-10-CM | POA: Insufficient documentation

## 2015-06-25 DIAGNOSIS — E785 Hyperlipidemia, unspecified: Secondary | ICD-10-CM | POA: Diagnosis not present

## 2015-06-25 DIAGNOSIS — Z79899 Other long term (current) drug therapy: Secondary | ICD-10-CM | POA: Insufficient documentation

## 2015-06-25 DIAGNOSIS — Z794 Long term (current) use of insulin: Secondary | ICD-10-CM | POA: Diagnosis not present

## 2015-06-25 DIAGNOSIS — E1122 Type 2 diabetes mellitus with diabetic chronic kidney disease: Secondary | ICD-10-CM | POA: Insufficient documentation

## 2015-06-25 DIAGNOSIS — Z862 Personal history of diseases of the blood and blood-forming organs and certain disorders involving the immune mechanism: Secondary | ICD-10-CM | POA: Diagnosis not present

## 2015-06-25 DIAGNOSIS — I129 Hypertensive chronic kidney disease with stage 1 through stage 4 chronic kidney disease, or unspecified chronic kidney disease: Secondary | ICD-10-CM | POA: Diagnosis not present

## 2015-06-25 DIAGNOSIS — Z7984 Long term (current) use of oral hypoglycemic drugs: Secondary | ICD-10-CM | POA: Diagnosis not present

## 2015-06-25 DIAGNOSIS — Z8719 Personal history of other diseases of the digestive system: Secondary | ICD-10-CM | POA: Insufficient documentation

## 2015-06-25 DIAGNOSIS — Z7982 Long term (current) use of aspirin: Secondary | ICD-10-CM | POA: Diagnosis not present

## 2015-06-25 DIAGNOSIS — Z3202 Encounter for pregnancy test, result negative: Secondary | ICD-10-CM | POA: Insufficient documentation

## 2015-06-25 LAB — BASIC METABOLIC PANEL
ANION GAP: 8 (ref 5–15)
BUN: 62 mg/dL — ABNORMAL HIGH (ref 6–20)
CHLORIDE: 106 mmol/L (ref 101–111)
CO2: 20 mmol/L — ABNORMAL LOW (ref 22–32)
Calcium: 8.7 mg/dL — ABNORMAL LOW (ref 8.9–10.3)
Creatinine, Ser: 10.1 mg/dL — ABNORMAL HIGH (ref 0.44–1.00)
GFR calc Af Amer: 5 mL/min — ABNORMAL LOW (ref >60)
GFR, EST NON AFRICAN AMERICAN: 4 mL/min — AB (ref >60)
GLUCOSE: 166 mg/dL — AB (ref 65–99)
POTASSIUM: 4.9 mmol/L (ref 3.5–5.1)
SODIUM: 134 mmol/L — AB (ref 135–145)

## 2015-06-25 LAB — CBC WITH DIFFERENTIAL/PLATELET
BASOS ABS: 0 10*3/uL (ref 0.0–0.1)
Basophils Relative: 0 %
Eosinophils Absolute: 0.1 10*3/uL (ref 0.0–0.7)
Eosinophils Relative: 1 %
HCT: 35.2 % — ABNORMAL LOW (ref 36.0–46.0)
HEMOGLOBIN: 11.3 g/dL — AB (ref 12.0–15.0)
LYMPHS ABS: 1.5 10*3/uL (ref 0.7–4.0)
LYMPHS PCT: 32 %
MCH: 28.8 pg (ref 26.0–34.0)
MCHC: 32.1 g/dL (ref 30.0–36.0)
MCV: 89.6 fL (ref 78.0–100.0)
Monocytes Absolute: 0.2 10*3/uL (ref 0.1–1.0)
Monocytes Relative: 5 %
NEUTROS ABS: 2.8 10*3/uL (ref 1.7–7.7)
Neutrophils Relative %: 62 %
PLATELETS: 273 10*3/uL (ref 150–400)
RBC: 3.93 MIL/uL (ref 3.87–5.11)
RDW: 16.3 % — ABNORMAL HIGH (ref 11.5–15.5)
WBC: 4.6 10*3/uL (ref 4.0–10.5)

## 2015-06-25 LAB — I-STAT BETA HCG BLOOD, ED (MC, WL, AP ONLY): I-stat hCG, quantitative: <5 m[IU]/mL (ref <5)

## 2015-06-25 MED ORDER — MEGESTROL ACETATE 40 MG PO TABS: 40.0000 mg | ORAL_TABLET | Freq: Two times a day (BID) | ORAL | Status: DC

## 2015-06-25 NOTE — ED Notes (Signed)
Pt sts vaginal bleeding heavy in nature starting yesterday

## 2015-06-25 NOTE — Discharge Instructions (Signed)
You were seen and evaluated today for your vaginal bleeding. Please take the medication prescribed as recommended by the gynecologist. They do not believe that there should be any issue with you taking this despite your kidney disease. You can double checked this with your kidney doctor. Return for shortness of breath, lightheadedness, uncontrolled bleeding. The gynecologist was going to talk to this schedule her to see if they could get to a sooner appointment for gynecologic follow-up for your vaginal bleeding.  Dysfunctional Uterine Bleeding Dysfunctional uterine bleeding is abnormal bleeding from the uterus. Dysfunctional uterine bleeding includes:  A period that comes earlier or later than usual.  A period that is lighter, heavier, or has blood clots.  Bleeding between periods.  Skipping one or more periods.  Bleeding after sexual intercourse.  Bleeding after menopause. HOME CARE INSTRUCTIONS  Pay attention to any changes in your symptoms. Follow these instructions to help with your condition: Eating  Eat well-balanced meals. Include foods that are high in iron, such as liver, meat, shellfish, green leafy vegetables, and eggs.  If you become constipated:  Drink plenty of water.  Eat fruits and vegetables that are high in water and fiber, such as spinach, carrots, raspberries, apples, and mango. Medicines  Take over-the-counter and prescription medicines only as told by your health care provider.  Do not change medicines without talking with your health care provider.  Aspirin or medicines that contain aspirin may make the bleeding worse. Do not take those medicines:  During the week before your period.  During your period.  If you were prescribed iron pills, take them as told by your health care provider. Iron pills help to replace iron that your body loses because of this condition. Activity  If you need to change your sanitary pad or tampon more than one time every 2  hours:  Lie in bed with your feet raised (elevated).  Place a cold pack on your lower abdomen.  Rest as much as possible until the bleeding stops or slows down.  Do not try to lose weight until the bleeding has stopped and your blood iron level is back to normal. Other Instructions  For two months, write down:  When your period starts.  When your period ends.  When any abnormal bleeding occurs.  What problems you notice.  Keep all follow up visits as told by your health care provider. This is important. SEEK MEDICAL CARE IF:  You get light-headed or weak.  You have nausea and vomiting.  You cannot eat or drink without vomiting.  You feel dizzy or have diarrhea while you are taking medicines.  You are taking birth control pills or hormones, and you want to change them or stop taking them. SEEK IMMEDIATE MEDICAL CARE IF:  You develop a fever or chills.  You need to change your sanitary pad or tampon more than one time per hour.  Your bleeding becomes heavier, or your flow contains clots more often.  You develop pain in your abdomen.  You lose consciousness.  You develop a rash.   This information is not intended to replace advice given to you by your health care provider. Make sure you discuss any questions you have with your health care provider.   Document Released: 01/15/2000 Document Revised: 10/08/2014 Document Reviewed: 04/14/2014 Elsevier Interactive Patient Education [employer].

## 2015-06-25 NOTE — ED Provider Notes (Signed)
CSN: [id number]     Arrival date & time 06/25/15  1641 History   First MD Initiated Contact with Patient 06/25/15 1727     Chief Complaint  Patient presents with  . Vaginal Bleeding     (Consider location/radiation/quality/duration/timing/severity/associated sxs/prior Treatment) HPI Comments: 50 year old female with history of chronic kidney disease is likely going to be starting dialysis in the near future, diabetes mellitus presents for vaginal bleeding. The patient reports that over the last several months to years she has had intermittent heavy vaginal bleeding with passage of large clots. She said that she has bled so much at times that her hemoglobin dropped down to 7. She is receiving IV iron infusions from her nephrologist to help support her anemia. She reports that today she started passing large clots of blood. She called the [facility] and was told they could see her in the clinic in one month. She had called her nephrologist office who told her to come in for evaluation in light of her bleeding and concern for drop in her hemoglobin. She denies shortness of breath, lightheadedness, chest pain. She otherwise is feeling well. She has never been trialed on progesterone to help with her bleeding. Patient says today she has gone through 5 super size pads secondary to bleeding.  Patient is a 50 y.o. female presenting with vaginal bleeding.  Vaginal Bleeding Associated symptoms: no abdominal pain, no back pain, no dizziness, no dysuria, no fatigue, no fever and no nausea     Past Medical History  Diagnosis Date  . Hypertension   . Hyperlipidemia   . Diabetes mellitus   . Pneumonia 07/04/2013  . CKD (chronic kidney disease), stage III   . Normocytic anemia   . Chest pain     a. 2D echo 01/2010 - mild TR, LVEF 60% with normal wall thickness, trace MR, mild TR, normal RV, trace pericardial effusion. b. Normal nuc 03/2011 at [facility].  . Hypoalbuminemia   . CHF (congestive heart failure)  ([place])   . Heart murmur     as a child  . GERD (gastroesophageal reflux disease)   . Diabetic retinopathy [facility])    Past Surgical History  Procedure Laterality Date  . Tonsillectomy  1980  . Cardiac catheterization  ~ 2008    negative, no intervention needed  . Tubal ligation  1995  . Eye surgery      eye injections  . Dilation and curettage of uterus      x 3  . Breast surgery      lumpectomy  . Bascilic vein transposition Left 03/04/2015    Procedure: BASILIC VEIN TRANSPOSITION;  Surgeon: Elam Dutch, MD;  Location: [facility] OR;  Service: Vascular;  Laterality: Left;   Family History  Problem Relation Age of Onset  . Malignant hyperthermia Mother   . Diabetes Mellitus II Mother   . Heart attack Father     Age 1  . Kidney disease Father   . Diabetes Mellitus II Sister   . Hypertension Sister    Social History  Substance Use Topics  . Smoking status: Never Smoker   . Smokeless tobacco: Never Used  . Alcohol Use: Yes     Comment: occasional drink of wine - 1 glass/week or less   OB History    No data available     Review of Systems  Constitutional: Negative for fever, appetite change and fatigue.  HENT: Negative for congestion and postnasal drip.   Eyes: Negative for visual disturbance.  Respiratory: Negative for cough, chest tightness and shortness of breath.   Cardiovascular: Negative for chest pain and palpitations.  Gastrointestinal: Negative for nausea, vomiting and abdominal pain.  Genitourinary: Positive for vaginal bleeding. Negative for dysuria, flank pain and vaginal pain.  Musculoskeletal: Negative for myalgias and back pain.  Skin: Negative for rash and wound.  Neurological: Negative for dizziness, light-headedness and headaches.  Hematological: Does not bruise/bleed easily.      Allergies  Review of patient's allergies indicates no known allergies.  Home Medications   Prior to Admission medications   Medication Sig Start Date End Date Taking?  Authorizing Provider  aspirin EC 81 MG tablet Take 81 mg by mouth daily.   Yes Historical Provider, MD  furosemide (LASIX) 40 MG tablet Take 40 mg by mouth daily. Reported on 05/21/2015   Yes Historical Provider, MD  glipiZIDE (GLUCOTROL) 10 MG tablet Take 1 tablet (10 mg total) by mouth 2 (two) times daily before a meal. 01/22/15  Yes Dorena Dew, FNP  Insulin Glargine (LANTUS SOLOSTAR) 100 UNIT/ML Solostar Pen Inject 10 Units into the skin daily at 10 pm. 02/06/15  Yes Dorena Dew, FNP  metoprolol succinate (TOPROL-XL) 100 MG 24 hr tablet Take 100 mg by mouth 2 (two) times daily. Take with or immediately following a meal.   Yes Historical Provider, MD  nitroGLYCERIN (NITROSTAT) 0.4 MG SL tablet Place 1 tablet (0.4 mg total) under the tongue every 5 (five) minutes as needed. For chest pain 11/13/14  Yes Nicole Pisciotta, PA-C  sodium bicarbonate 650 MG tablet Take 1,300 mg by mouth 2 (two) times daily.   Yes Historical Provider, MD  atorvastatin (LIPITOR) 80 MG tablet Take 1 tablet (80 mg total) by mouth daily. Patient not taking: Reported on 06/25/2015 11/13/14   Elmyra Ricks Pisciotta, PA-C  blood glucose meter kit and supplies KIT Dispense based on patient and insurance preference. Use up to four times daily as directed. (FOR ICD-9 250.00, 250.01). 11/13/14   Nicole Pisciotta, PA-C  docusate sodium (COLACE) 100 MG capsule Take 1 capsule (100 mg total) by mouth 2 (two) times daily. Patient not taking: Reported on 05/21/2015 03/06/15   Dorena Dew, FNP  glucose blood (TRUE METRIX BLOOD GLUCOSE TEST) test strip Use as instructed 01/02/15   Dorena Dew, FNP  Insulin Pen Needle (BD PEN NEEDLE NANO U/F) 32G X 4 MM MISC Use 1x a day 07/18/14   Linton Flemings, MD  megestrol (MEGACE) 40 MG tablet Take 1 tablet (40 mg total) by mouth 2 (two) times daily. 06/25/15   Harvel Quale, MD  metoprolol (TOPROL-XL) 200 MG 24 hr tablet Take 1 tablet (200 mg total) by mouth daily. Take with or immediately  following a meal. Patient not taking: Reported on 06/25/2015 04/28/15   Dorena Dew, FNP   BP 177/86 mmHg  Pulse 78  Temp(Src) 97.9 F (36.6 C) (Oral)  Resp 18  SpO2 100% Physical Exam  Constitutional: She is oriented to person, place, and time. She appears well-developed and well-nourished. No distress.  HENT:  Head: Normocephalic and atraumatic.  Right Ear: External ear normal.  Left Ear: External ear normal.  Nose: Nose normal.  Mouth/Throat: Oropharynx is clear and moist. No oropharyngeal exudate.  Eyes: EOM are normal. Pupils are equal, round, and reactive to light.  Neck: Normal range of motion. Neck supple.  Cardiovascular: Normal rate, regular rhythm, normal heart sounds and intact distal pulses.   No murmur heard. Pulmonary/Chest: Effort normal. No respiratory distress.  She has no wheezes. She has no rales.  Abdominal: Soft. She exhibits no distension. There is no tenderness.  Genitourinary: There is bleeding (oderate amount of pooling blood in the vaginal vault without large clots) in the vagina.  Musculoskeletal: Normal range of motion. She exhibits no edema or tenderness.  Neurological: She is alert and oriented to person, place, and time.  Skin: Skin is warm and dry. No rash noted. She is not diaphoretic.  Vitals reviewed.   ED Course  Procedures (including critical care time) Labs Review Labs Reviewed  CBC WITH DIFFERENTIAL/PLATELET - Abnormal; Notable for the following:    Hemoglobin 11.3 (*)    HCT 35.2 (*)    RDW 16.3 (*)    All other components within normal limits  BASIC METABOLIC PANEL - Abnormal; Notable for the following:    Sodium 134 (*)    CO2 20 (*)    Glucose, Bld 166 (*)    BUN 62 (*)    Creatinine, Ser 10.10 (*)    Calcium 8.7 (*)    GFR calc non Af Amer 4 (*)    GFR calc Af Amer 5 (*)    All other components within normal limits  I-STAT BETA HCG BLOOD, ED ([facility], [facility], [facility] ONLY)    Imaging Review No results found. I have personally  reviewed and evaluated these images and lab results as part of my medical decision-making.   EKG Interpretation None      MDM  Patient was seen and evaluated in stable condition. Patient hemodynamically stable, hemoglobin 11.1 which is decreased from her previous. Well appearing on examination. She did have bleeding on her vaginal exam. Previous results in visit were reviewed. Patient had an ultrasound in March that did show fibroids. I discussed the case on the phone with the on-call gynecologist who said that he would recommend the patient be started on Megace 40 mg twice daily. He also took her information to give to the scheduler at the women's clinic to see if they could facilitate getting her sooner appointment. I did discuss with him the fact that she was receiving iron transfusions and that she had chronic kidney disease and was going to be started on dialysis soon he did not believe that there was any contraindication to starting the Megace. I discussed this with the patient at bedside. She said she would verify with her nephrologist which I said was a good idea. She was discharged home in stable condition with strict return precautions. Final diagnoses:  Vaginal bleeding    1. Dysfunctional uterine bleeding    Harvel Quale, MD 06/25/15 [phone]

## 2015-07-01 ENCOUNTER — Encounter (HOSPITAL_COMMUNITY)
Admission: RE | Admit: 2015-07-01 | Discharge: 2015-07-01 | Disposition: A | Discharge status: Home / Self Care | Payer: Medicaid Other | Source: Ambulatory Visit | Attending: Nephrology | Admitting: Nephrology

## 2015-07-01 DIAGNOSIS — N185 Chronic kidney disease, stage 5: Secondary | ICD-10-CM | POA: Diagnosis not present

## 2015-07-01 LAB — POCT HEMOGLOBIN-HEMACUE: HEMOGLOBIN: 9.9 g/dL — AB (ref 12.0–15.0)

## 2015-07-01 MED ORDER — EPOETIN ALFA 20000 UNIT/ML IJ SOLN
INTRAMUSCULAR | Status: AC
  Administered 2015-07-01: 20000 [IU] via SUBCUTANEOUS
  Filled 2015-07-01: qty 1

## 2015-07-01 MED ORDER — EPOETIN ALFA 40000 UNIT/ML IJ SOLN: 30000.0000 [IU] | INTRAMUSCULAR | Status: DC

## 2015-07-01 MED ORDER — EPOETIN ALFA 10000 UNIT/ML IJ SOLN
INTRAMUSCULAR | Status: AC
  Administered 2015-07-01: 10000 [IU] via SUBCUTANEOUS
  Filled 2015-07-01: qty 1

## 2015-07-08 ENCOUNTER — Encounter (HOSPITAL_COMMUNITY)
Admission: RE | Admit: 2015-07-08 | Discharge: 2015-07-08 | Disposition: A | Discharge status: Home / Self Care | Payer: Medicaid Other | Source: Ambulatory Visit | Attending: Nephrology | Admitting: Nephrology

## 2015-07-08 DIAGNOSIS — Z5181 Encounter for therapeutic drug level monitoring: Secondary | ICD-10-CM | POA: Diagnosis not present

## 2015-07-08 DIAGNOSIS — Z79899 Other long term (current) drug therapy: Secondary | ICD-10-CM | POA: Diagnosis not present

## 2015-07-08 DIAGNOSIS — N185 Chronic kidney disease, stage 5: Secondary | ICD-10-CM | POA: Diagnosis not present

## 2015-07-08 DIAGNOSIS — D631 Anemia in chronic kidney disease: Secondary | ICD-10-CM | POA: Insufficient documentation

## 2015-07-08 LAB — POCT HEMOGLOBIN-HEMACUE: Hemoglobin: 11.3 g/dL — ABNORMAL LOW (ref 12.0–15.0)

## 2015-07-08 MED ORDER — EPOETIN ALFA 20000 UNIT/ML IJ SOLN
INTRAMUSCULAR | Status: AC
  Administered 2015-07-08: 20000 [IU] via SUBCUTANEOUS
  Filled 2015-07-08: qty 1

## 2015-07-08 MED ORDER — EPOETIN ALFA 10000 UNIT/ML IJ SOLN
INTRAMUSCULAR | Status: AC
  Administered 2015-07-08: 10000 [IU] via SUBCUTANEOUS
  Filled 2015-07-08: qty 1

## 2015-07-08 MED ORDER — EPOETIN ALFA 40000 UNIT/ML IJ SOLN: 30000.0000 [IU] | INTRAMUSCULAR | Status: DC

## 2015-07-17 ENCOUNTER — Encounter (HOSPITAL_COMMUNITY)
Admission: RE | Admit: 2015-07-17 | Discharge: 2015-07-17 | Disposition: A | Discharge status: Home / Self Care | Payer: Medicaid Other | Source: Ambulatory Visit | Attending: Nephrology | Admitting: Nephrology

## 2015-07-17 DIAGNOSIS — N185 Chronic kidney disease, stage 5: Secondary | ICD-10-CM | POA: Diagnosis not present

## 2015-07-17 LAB — POCT HEMOGLOBIN-HEMACUE: Hemoglobin: 11.8 g/dL — ABNORMAL LOW (ref 12.0–15.0)

## 2015-07-17 MED ORDER — EPOETIN ALFA 10000 UNIT/ML IJ SOLN
INTRAMUSCULAR | Status: AC
  Administered 2015-07-17: 10000 [IU] via SUBCUTANEOUS
  Filled 2015-07-17: qty 1

## 2015-07-17 MED ORDER — EPOETIN ALFA 40000 UNIT/ML IJ SOLN: 30000.0000 [IU] | INTRAMUSCULAR | Status: DC

## 2015-07-17 MED ORDER — EPOETIN ALFA 20000 UNIT/ML IJ SOLN
INTRAMUSCULAR | Status: AC
  Administered 2015-07-17: 20000 [IU] via SUBCUTANEOUS
  Filled 2015-07-17: qty 1

## 2015-07-20 ENCOUNTER — Encounter: Payer: Self-pay | Admitting: Family Medicine

## 2015-07-20 ENCOUNTER — Ambulatory Visit (INDEPENDENT_AMBULATORY_CARE_PROVIDER_SITE_OTHER): Payer: Medicaid Other | Admitting: Family Medicine

## 2015-07-20 VITALS — BP 162/84 | HR 56 | Temp 98.4°F | Resp 16 | Ht 67.0 in | Wt 172.0 lb

## 2015-07-20 DIAGNOSIS — I10 Essential (primary) hypertension: Secondary | ICD-10-CM

## 2015-07-20 DIAGNOSIS — E785 Hyperlipidemia, unspecified: Secondary | ICD-10-CM | POA: Diagnosis not present

## 2015-07-20 DIAGNOSIS — E1165 Type 2 diabetes mellitus with hyperglycemia: Secondary | ICD-10-CM | POA: Diagnosis not present

## 2015-07-20 DIAGNOSIS — G4700 Insomnia, unspecified: Secondary | ICD-10-CM | POA: Diagnosis not present

## 2015-07-20 DIAGNOSIS — Z794 Long term (current) use of insulin: Secondary | ICD-10-CM

## 2015-07-20 LAB — HEMOGLOBIN A1C
HEMOGLOBIN A1C: 7 % — AB (ref <5.7)
Mean Plasma Glucose: 154 mg/dL

## 2015-07-20 LAB — COMPLETE METABOLIC PANEL WITH GFR
ALT: 5 U/L — AB (ref 6–29)
AST: 6 U/L — AB (ref 10–35)
Albumin: 3.2 g/dL — ABNORMAL LOW (ref 3.6–5.1)
Alkaline Phosphatase: 45 U/L (ref 33–115)
BILIRUBIN TOTAL: 0.3 mg/dL (ref 0.2–1.2)
BUN: 72 mg/dL — AB (ref 7–25)
CHLORIDE: 103 mmol/L (ref 98–110)
CO2: 25 mmol/L (ref 20–31)
CREATININE: 10.12 mg/dL — AB (ref 0.50–1.10)
Calcium: 8.5 mg/dL — ABNORMAL LOW (ref 8.6–10.2)
GFR, Est African American: 5 mL/min — ABNORMAL LOW (ref >=60)
GFR, Est Non African American: 4 mL/min — ABNORMAL LOW (ref >=60)
Glucose, Bld: 93 mg/dL (ref 65–99)
Potassium: 5.4 mmol/L — ABNORMAL HIGH (ref 3.5–5.3)
Sodium: 139 mmol/L (ref 135–146)
TOTAL PROTEIN: 6.3 g/dL (ref 6.1–8.1)

## 2015-07-20 LAB — LIPID PANEL
CHOLESTEROL: 194 mg/dL (ref 125–200)
HDL: 39 mg/dL — ABNORMAL LOW (ref >=46)
LDL CALC: 130 mg/dL — AB (ref <130)
Total CHOL/HDL Ratio: 5 Ratio (ref <=5.0)
Triglycerides: 125 mg/dL (ref <150)
VLDL: 25 mg/dL (ref <30)

## 2015-07-20 MED ORDER — ZOLPIDEM TARTRATE 5 MG PO TABS: 5.0000 mg | ORAL_TABLET | Freq: Every evening | ORAL | Status: DC | PRN

## 2015-07-20 NOTE — Patient Instructions (Signed)
Insomnia Insomnia is a sleep disorder that makes it difficult to fall asleep or to stay asleep. Insomnia can cause tiredness (fatigue), low energy, difficulty concentrating, mood swings, and poor performance at work or school.  There are three different ways to classify insomnia:  Difficulty falling asleep.  Difficulty staying asleep.  Waking up too early in the morning. Any type of insomnia can be long-term (chronic) or short-term (acute). Both are common. Short-term insomnia usually lasts for three months or less. Chronic insomnia occurs at least three times a week for longer than three months. CAUSES  Insomnia may be caused by another condition, situation, or substance, such as:  Anxiety.  Certain medicines.  Gastroesophageal reflux disease (GERD) or other gastrointestinal conditions.  Asthma or other breathing conditions.  Restless legs syndrome, sleep apnea, or other sleep disorders.  Chronic pain.  Menopause. This may include hot flashes.  Stroke.  Abuse of alcohol, tobacco, or illegal drugs.  Depression.  Caffeine.   Neurological disorders, such as Alzheimer disease.  An overactive thyroid (hyperthyroidism). The cause of insomnia may not be known. RISK FACTORS Risk factors for insomnia include:  Gender. Women are more commonly affected than men.  Age. Insomnia is more common as you get older.  Stress. This may involve your professional or personal life.  Income. Insomnia is more common in people with lower income.  Lack of exercise.   Irregular work schedule or night shifts.  Traveling between different time zones. SIGNS AND SYMPTOMS If you have insomnia, trouble falling asleep or trouble staying asleep is the main symptom. This may lead to other symptoms, such as:  Feeling fatigued.  Feeling nervous about going to sleep.  Not feeling rested in the morning.  Having trouble concentrating.  Feeling irritable, anxious, or depressed. TREATMENT   Treatment for insomnia depends on the cause. If your insomnia is caused by an underlying condition, treatment will focus on addressing the condition. Treatment may also include:   Medicines to help you sleep.  Counseling or therapy.  Lifestyle adjustments. HOME CARE INSTRUCTIONS   Take medicines only as directed by your health care provider.  Keep regular sleeping and waking hours. Avoid naps.  Keep a sleep diary to help you and your health care provider figure out what could be causing your insomnia. Include:   When you sleep.  When you wake up during the night.  How well you sleep.   How rested you feel the next day.  Any side effects of medicines you are taking.  What you eat and drink.   Make your bedroom a comfortable place where it is easy to fall asleep:  Put up shades or special blackout curtains to block light from outside.  Use a white noise machine to block noise.  Keep the temperature cool.   Exercise regularly as directed by your health care provider. Avoid exercising right before bedtime.  Use relaxation techniques to manage stress. Ask your health care provider to suggest some techniques that may work well for you. These may include:  Breathing exercises.  Routines to release muscle tension.  Visualizing peaceful scenes.  Cut back on alcohol, caffeinated beverages, and cigarettes, especially close to bedtime. These can disrupt your sleep.  Do not overeat or eat spicy foods right before bedtime. This can lead to digestive discomfort that can make it hard for you to sleep.  Limit screen use before bedtime. This includes:  Watching TV.  Using your smartphone, tablet, and computer.  Stick to a routine. This   can help you fall asleep faster. Try to do a quiet activity, brush your teeth, and go to bed at the same time each night.  Get out of bed if you are still awake after 15 minutes of trying to sleep. Keep the lights down, but try reading or  doing a quiet activity. When you feel sleepy, go back to bed.  Make sure that you drive carefully. Avoid driving if you feel very sleepy.  Keep all follow-up appointments as directed by your health care provider. This is important. SEEK MEDICAL CARE IF:   You are tired throughout the day or have trouble in your daily routine due to sleepiness.  You continue to have sleep problems or your sleep problems get worse. SEEK IMMEDIATE MEDICAL CARE IF:   You have serious thoughts about hurting yourself or someone else.   This information is not intended to replace advice given to you by your health care provider. Make sure you discuss any questions you have with your health care provider.   Document Released: 01/15/2000 Document Revised: 10/08/2014 Document Reviewed: 10/18/2013 Elsevier Interactive Patient Education 2016 [employer] Inc[employer]  

## 2015-07-20 NOTE — Progress Notes (Signed)
Subjective:    Patient ID: Jade Bond, female    DOB: July 30, 1965, 50 y.o.   MRN: [id number]  HPI Jade Bond, a 50 year old female presents for a follow up of uncontrolled hypertension and diabetes. Jade Bond also has a history of uncontrolled hypertension and stage 5 chronic kidney disease. She is followed by Dr. Jeneen Rinks Deterding, nephrologist for end stage kidney disease.    She states that she has not taken antihypertensive medication today. She last took Metoprolol 100 mg on last night.  She maintains that she does not exercise or follow a low fat diet. She has been consistent with taking medications. She reports that she does not smoke, but she has smokers living in her home. She states that she has had chest pains in the past. She currently denies chest pains, heart palpitations, or lower extremity edema. Previous urine studies indicated microalbuminuria.   Patient also has a history of type 2 diabetes mellitus. Previous hemoglobin a1C was 6.7%, which is reduced from 7.7 some months ago. She has been following a low carbohydrate diet. Symptoms have decreased since starting current medication regimen. She has been checking blood sugars consistently at home. Patient is complaining of some weight loss.  Evaluation to date has been included: fasting lipid panel, hemoglobin A1C and microalbuminuria.    Jade Bond is also complaining of insomnia. She is having difficulty staying asleep. She is getting 2-3 hours of sleep per night. She says that she does not sleep with lights or television. She also does not use the computer or a cell phone in bed.   Past Medical History  Diagnosis Date  . Hypertension   . Hyperlipidemia   . Diabetes mellitus   . Pneumonia 07/04/2013  . CKD (chronic kidney disease), stage III   . Normocytic anemia   . Chest pain     a. 2D echo 01/2010 - mild TR, LVEF 60% with normal wall thickness, trace MR, mild TR, normal RV, trace pericardial effusion. b. Normal nuc  03/2011 at [facility].  . Hypoalbuminemia   . CHF (congestive heart failure) ([place])   . Heart murmur     as a child  . GERD (gastroesophageal reflux disease)   . Diabetic retinopathy ([place])    Immunization History  Administered Date(s) Administered  . Influenza Split 03/19/2011, 11/24/2011  . Influenza,inj,Quad PF,36+ Mos 11/27/2014  . Pneumococcal Polysaccharide-23 03/19/2011  No Known Allergies Social History   Social History  . Marital Status: Legally Separated    Spouse Name: N/A  . Number of Children: N/A  . Years of Education: N/A   Occupational History  .      Food Therapist, nutritional   Social History Main Topics  . Smoking status: Never Smoker   . Smokeless tobacco: Never Used  . Alcohol Use: Yes     Comment: occasional drink of wine - 1 glass/week or less  . Drug Use: No  . Sexual Activity: Not on file   Other Topics Concern  . Not on file   Social History Narrative   Review of Systems  Constitutional: Positive for unexpected weight change (weight loss).  Eyes: Negative for photophobia.  Respiratory: Negative for apnea and cough.   Cardiovascular: Negative.   Genitourinary: Negative.   Skin: Negative.   Allergic/Immunologic: Negative.   Neurological: Negative.   Hematological: Negative.   Psychiatric/Behavioral: Positive for sleep disturbance (complains of difficulty falling asleep and staying asleep). Negative for suicidal ideas.       Objective:  Physical Exam  Constitutional: She is oriented to person, place, and time. She appears well-developed and well-nourished. She has a sickly appearance. No distress.  HENT:  Head: Normocephalic and atraumatic.  Right Ear: External ear normal.  Left Ear: External ear normal.  Nose: Nose normal.  Mouth/Throat: Oropharynx is clear and moist.  Eyes: Conjunctivae and EOM are normal. Pupils are equal, round, and reactive to light.  Neck: Normal range of motion. Neck supple.  Cardiovascular: Normal rate, regular rhythm,  normal heart sounds and intact distal pulses.   Pulmonary/Chest: Effort normal and breath sounds normal.  Abdominal: Soft. Bowel sounds are normal.  Neurological: She is alert and oriented to person, place, and time. She has normal reflexes.  Skin: Skin is warm, dry and intact. No rash noted.  Fistula to left upper arm    Psychiatric: She has a normal mood and affect. Her behavior is normal. Judgment and thought content normal.     BP 162/84 mmHg  Pulse 56  Temp(Src) 98.4 F (36.9 C) (Oral)  Resp 16  Ht 5\' 7"  (1.702 m)  Wt 172 lb (78.019 kg)  BMI 26.93 kg/m2  SpO2 100%  LMP 07/13/2015 Assessment & Plan:  1. Essential hypertension Blood pressure is not at goal on current medication regimen. Patient has not taken bp medications since last night. Blood pressure has improved from previous visit.   - COMPLETE METABOLIC PANEL WITH GFR  2. Uncontrolled type 2 diabetes mellitus with hyperglycemia, with long-term current use of insulin ([place]) The patient is asked to make an attempt to improve diet and exercise patterns to aid in medical management of this problem. Continue current medication regimen. Will check hemoglobin a1C and follow up with Jade Bond by phone.  - Hemoglobin A1c  3. Hyperlipidemia She is currently not taking Lipitor 80 mg. Will check cholesterol panel.  - Lipid Panel  4. Insomnia Discussed sleep hygiene at length. Will start a low dose of Zolpidem 5 mg. Zolpidem does not require a renal adjustment. Jade Bond will follow up in 1 month.  - zolpidem (AMBIEN) 5 MG tablet; Take 1 tablet (5 mg total) by mouth at bedtime as needed for sleep.  Dispense: 30 tablet; Refill: 0   RTC: 1 month for insomnia [name] M, FNP   The patient was given clear instructions to go to ER or return to medical center if symptoms do not improve, worsen or new problems develop. The patient verbalized understanding. Will notify patient with laboratory results.

## 2015-07-21 ENCOUNTER — Telehealth: Payer: Self-pay | Admitting: Hematology

## 2015-07-21 NOTE — Telephone Encounter (Signed)
Critical value alert / Creatinine of 10.12 Read back and verified by this RN / [place] Hollis notified verablly

## 2015-07-22 MED FILL — ZOLPIDEM TARTRATE 5 MG TAB: 5 | 30 days supply | Qty: 30 | Fill #0

## 2015-07-23 ENCOUNTER — Other Ambulatory Visit (HOSPITAL_COMMUNITY): Payer: Self-pay | Admitting: *Deleted

## 2015-07-24 ENCOUNTER — Encounter (HOSPITAL_COMMUNITY)
Admission: RE | Admit: 2015-07-24 | Discharge: 2015-07-24 | Disposition: A | Discharge status: Home / Self Care | Payer: Medicaid Other | Source: Ambulatory Visit | Attending: Nephrology | Admitting: Nephrology

## 2015-07-24 DIAGNOSIS — N185 Chronic kidney disease, stage 5: Secondary | ICD-10-CM | POA: Diagnosis not present

## 2015-07-24 LAB — FERRITIN: FERRITIN: 25 ng/mL (ref 11–307)

## 2015-07-24 LAB — IRON AND TIBC
Iron: 56 ug/dL (ref 28–170)
Saturation Ratios: 24 % (ref 10.4–31.8)
TIBC: 237 ug/dL — ABNORMAL LOW (ref 250–450)
UIBC: 181 ug/dL

## 2015-07-24 LAB — POCT HEMOGLOBIN-HEMACUE: Hemoglobin: 12 g/dL (ref 12.0–15.0)

## 2015-07-24 MED ORDER — EPOETIN ALFA 40000 UNIT/ML IJ SOLN: 30000.0000 [IU] | INTRAMUSCULAR | Status: DC

## 2015-07-24 MED FILL — FUROSEMIDE 40 MG TABLET: 40 | 31 days supply | Qty: 31 | Fill #3 | Status: TO

## 2015-07-24 MED FILL — METOPROLOL SUCC ER 100 MG T: 100 | 30 days supply | Qty: 60 | Fill #2

## 2015-07-27 ENCOUNTER — Encounter: Payer: Self-pay | Admitting: Obstetrics & Gynecology

## 2015-07-27 ENCOUNTER — Encounter: Payer: Self-pay | Admitting: Family Medicine

## 2015-07-27 ENCOUNTER — Other Ambulatory Visit: Payer: Self-pay | Admitting: Family Medicine

## 2015-07-27 ENCOUNTER — Ambulatory Visit (INDEPENDENT_AMBULATORY_CARE_PROVIDER_SITE_OTHER): Payer: Medicaid Other | Admitting: Obstetrics & Gynecology

## 2015-07-27 ENCOUNTER — Other Ambulatory Visit (HOSPITAL_COMMUNITY)
Admission: RE | Admit: 2015-07-27 | Discharge: 2015-07-27 | Disposition: A | Discharge status: Home / Self Care | Payer: Medicaid Other | Source: Ambulatory Visit | Attending: Obstetrics & Gynecology | Admitting: Obstetrics & Gynecology

## 2015-07-27 VITALS — BP 181/63 | HR 61 | Wt 168.9 lb

## 2015-07-27 DIAGNOSIS — N921 Excessive and frequent menstruation with irregular cycle: Secondary | ICD-10-CM | POA: Diagnosis not present

## 2015-07-27 DIAGNOSIS — Z1151 Encounter for screening for human papillomavirus (HPV): Secondary | ICD-10-CM

## 2015-07-27 DIAGNOSIS — Z124 Encounter for screening for malignant neoplasm of cervix: Secondary | ICD-10-CM | POA: Diagnosis not present

## 2015-07-27 DIAGNOSIS — Z01419 Encounter for gynecological examination (general) (routine) without abnormal findings: Secondary | ICD-10-CM | POA: Insufficient documentation

## 2015-07-27 DIAGNOSIS — Z1231 Encounter for screening mammogram for malignant neoplasm of breast: Secondary | ICD-10-CM

## 2015-07-27 DIAGNOSIS — Z1239 Encounter for other screening for malignant neoplasm of breast: Secondary | ICD-10-CM

## 2015-07-27 MED ORDER — MEGESTROL ACETATE 40 MG PO TABS: 40.0000 mg | ORAL_TABLET | Freq: Two times a day (BID) | ORAL | Status: DC

## 2015-07-27 MED FILL — CALCIUM ACETATE 667 MG CAP: 667 | 30 days supply | Qty: 90 | Fill #0

## 2015-07-27 MED FILL — MEGESTROL 40 MG TABLET: 40 | 15 days supply | Qty: 60 | Fill #0

## 2015-07-27 NOTE — Patient Instructions (Signed)

## 2015-07-27 NOTE — Progress Notes (Signed)
CLINIC ENCOUNTER NOTE  History:  50 y.o. F here today for evaluation of AUB that has been going on for several months. She has a complicated medical history; see below.  She is due to start dialysis next week.  Periods are heavier, and irregular in nature. No current bleeding, Hgb on 07/16/25 was 11.8.   She denies any abnormal vaginal discharge, pelvic pain or other concerns.   Past Medical History  Diagnosis Date  . Hypertension   . Hyperlipidemia   . Diabetes mellitus   . Pneumonia 07/04/2013  . CKD (chronic kidney disease), stage III   . Normocytic anemia   . Chest pain     a. 2D echo 01/2010 - mild TR, LVEF 60% with normal wall thickness, trace MR, mild TR, normal RV, trace pericardial effusion. b. Normal nuc 03/2011 at [facility].  . Hypoalbuminemia   . CHF (congestive heart failure) ([place])   . Heart murmur     as a child  . GERD (gastroesophageal reflux disease)   . Diabetic retinopathy [facility])     Past Surgical History  Procedure Laterality Date  . Tonsillectomy  1980  . Cardiac catheterization  ~ 2008    negative, no intervention needed  . Tubal ligation  1995  . Dilation and curettage of uterus      x 3  . Breast surgery      lumpectomy  . Bascilic vein transposition Left 03/04/2015    Procedure: BASILIC VEIN TRANSPOSITION;  Surgeon: Elam Dutch, MD;  Location: [place];  Service: Vascular;  Laterality: Left;  Marland Kitchen Eye surgery      eye injections  . Eye surgery      right eye surgery     The following portions of the patient's history were reviewed and updated as appropriate: allergies, current medications, past family history, past medical history, past social history, past surgical history and problem list.   Health Maintenance:  Normal pap several years ago.  Abnormal mammogram in 2014 with 6 mm poorly defined mass-like density in the upper outer right breast containing microcalcification , no follow up since.   Review of Systems:  Pertinent items noted in HPI and  remainder of comprehensive ROS otherwise negative.  Objective:  Physical Exam BP 181/63 mmHg  Pulse 61  Wt 168 lb 14.4 oz (76.613 kg)  LMP 07/13/2015 CONSTITUTIONAL: Well-developed, well-nourished female in no acute distress.  HENT:  Normocephalic, atraumatic. External right and left ear normal. Oropharynx is clear and moist EYES: Conjunctivae and EOM are normal. Pupils are equal, round, and reactive to light. No scleral icterus.  NECK: Normal range of motion, supple, no masses SKIN: Skin is warm and dry. No rash noted. Not diaphoretic. No erythema. No pallor. NEUROLOGIC: Alert and oriented to person, place, and time. Normal reflexes, muscle tone coordination. No cranial nerve deficit noted. PSYCHIATRIC: Normal mood and affect. Normal behavior. Normal judgment and thought content. CARDIOVASCULAR: Normal heart rate noted RESPIRATORY: Effort and breath sounds normal, no problems with respiration noted ABDOMEN: Soft, no distention noted.   PELVIC: Normal appearing external genitalia; normal appearing vaginal mucosa and cervix.  No abnormal discharge noted.  Pap smear obtained. Normal uterine size, no other palpable masses, no uterine or adnexal tenderness. MUSCULOSKELETAL: Normal range of motion. No edema noted.  ENDOMETRIAL BIOPSY     The indications for endometrial biopsy were reviewed.   Risks of the biopsy including cramping, bleeding, infection, uterine perforation, inadequate specimen and need for additional procedures  were discussed. The  patient states she understands and agrees to undergo procedure today. Consent was signed. Time out was performed. During the pelvic exam, the cervix was prepped with Betadine. A single-toothed tenaculum was placed on the anterior lip of the cervix to stabilize it. The 3 mm pipelle was introduced into the endometrial cavity without difficulty to a depth of 10 cm, and a moderate amount of tissue was obtained and sent to pathology. The instruments were  removed from the patient's vagina. Minimal bleeding from the cervix was noted. The patient tolerated the procedure well. Routine post-procedure instructions were given to the patient.    Imaging 04/24/2015 TRANSABDOMINAL AND TRANSVAGINAL ULTRASOUND OF PELVIS  CLINICAL DATA: 50 year old female with irregular menstrual cycles. The patient reports she is premenopausal. Initial encounter.  COMPARISON: Renal ultrasound 01/01/2015.  FINDINGS: Uterus  Measurements: 12.9 x 6.1 x 5.9 cm. Several round intramural appearing uterine fibroids up to 3.3 cm diameter at the ventral fundus (image 54).  Endometrium  Thickness: 6 mm. No focal abnormality visualized.  Right ovary  Measurements: 5.6 x 3.1 x 3.3 cm. Oval 1.5 cm simple cyst or follicle with no solid elements. Normal appearance/no adnexal mass.  Left ovary  Measurements: 4.1 x 3.6 x 4.2 cm. Normal appearance/no adnexal mass.  Other findings  Trace simple appearing free fluid in the cul-de-sac.  IMPRESSION: 1. Several small intramural appearing uterine fibroids, the largest is 3.3 cm at the ventral fundus. 2. Endometrium within normal limits. Physiologic appearance of both ovaries. 3. Trace simple appearing free fluid in the cul-de-sac likely is physiologic.    Assessment & Plan:   1. Menorrhagia with irregular cycle Discussed nonsurgical management of AUB; oral progestin vs IUD recommended. She wants to try Megace for now, this was prescribed. Will follow up pap and endometrial biopsy results. - megestrol (MEGACE) 40 MG tablet; Take 1 tablet (40 mg total) by mouth 2 (two) times daily. Can increase to two tablets twice a day in the event of heavy bleeding  Dispense: 60 tablet; Refill: 2 - Cytology - PAP - Surgical pathology for endometrial biopsy.  2. Visit for screening mammogram - MM Digital Screening; Future  Routine preventative health maintenance measures emphasized. Please refer to After Visit Summary  for other counseling recommendations.   Return in about 2 months (around 09/26/2015) for AUB follow up.   Total face-to-face time with patient: 30 minutes. Over 50% of encounter was spent on counseling and coordination of care.   Verita Schneiders, MD, [place] Attending Obstetrician & Gynecologist, [place] for [facility]

## 2015-07-27 NOTE — Progress Notes (Signed)
Mammogram scheduled for July 6th @ 1300.  Pt notified.

## 2015-07-29 LAB — CYTOLOGY - PAP

## 2015-08-03 ENCOUNTER — Encounter: Payer: Self-pay | Admitting: General Practice

## 2015-08-05 MED FILL — RENA-VITE TABLET: 50 days supply | Qty: 50 | Fill #0

## 2015-08-05 MED FILL — LIDOCAINE-PRILOCAINE CREAM: 2.5-2.5 | 15 days supply | Qty: 30 | Fill #0

## 2015-08-05 MED FILL — glipiZIDE 10 MG TABS: 10 | 30 days supply | Qty: 60 | Fill #3

## 2015-08-06 ENCOUNTER — Ambulatory Visit: Payer: Medicaid Other

## 2015-08-06 MED FILL — SODIUM BICARB 10 GRAIN TAB: 650 | 30 days supply | Qty: 120 | Fill #1 | Status: TO

## 2015-08-14 ENCOUNTER — Encounter (HOSPITAL_COMMUNITY): Payer: Medicaid Other

## 2015-08-21 ENCOUNTER — Encounter: Payer: Self-pay | Admitting: Family Medicine

## 2015-08-21 ENCOUNTER — Ambulatory Visit (INDEPENDENT_AMBULATORY_CARE_PROVIDER_SITE_OTHER): Payer: Medicaid Other | Admitting: Family Medicine

## 2015-08-21 VITALS — BP 140/55 | HR 64 | Temp 98.3°F | Resp 18 | Ht 67.0 in | Wt 170.0 lb

## 2015-08-21 DIAGNOSIS — G4700 Insomnia, unspecified: Secondary | ICD-10-CM | POA: Diagnosis not present

## 2015-08-21 DIAGNOSIS — E138 Other specified diabetes mellitus with unspecified complications: Secondary | ICD-10-CM | POA: Diagnosis not present

## 2015-08-21 LAB — GLUCOSE, CAPILLARY: GLUCOSE-CAPILLARY: 238 mg/dL — AB (ref 65–99)

## 2015-08-21 MED ORDER — ZOLPIDEM TARTRATE 5 MG PO TABS: 5.0000 mg | ORAL_TABLET | Freq: Every evening | ORAL | Status: DC | PRN

## 2015-08-21 NOTE — Progress Notes (Signed)
Patient ID: Jade Bond, female   DOB: 06/16/1965, 50 y.o.   MRN: [id number]   Jade Bond, is a 50 y.o. female  [id number]  [id number]  DOB - 09-Jul-1965  CC:  Chief Complaint  Patient presents with  . Follow-up    3 month       HPI: Jade Bond is a 50 y.o. female here to follow-up on insomnia. She was seen here about a month ago and was prescribed Ambien 5 mg at HS prn for sleep.  She reports this has not completely solved the problem but has helped enough that she wishes to continue. She has taken an average of 3-4 times a week. He chronic illnesses have been assessed recently either here or elsewhere. Her other medications are listed in her medication list.  No Known Allergies Past Medical History  Diagnosis Date  . Hypertension   . Hyperlipidemia   . Diabetes mellitus   . Pneumonia 07/04/2013  . CKD (chronic kidney disease), stage III   . Normocytic anemia   . Chest pain     a. 2D echo 01/2010 - mild TR, LVEF 60% with normal wall thickness, trace MR, mild TR, normal RV, trace pericardial effusion. b. Normal nuc 03/2011 at [facility].  . Hypoalbuminemia   . CHF (congestive heart failure) ([place])   . Heart murmur     as a child  . GERD (gastroesophageal reflux disease)   . Diabetic retinopathy [facility])    Current Outpatient Prescriptions on File Prior to Visit  Medication Sig Dispense Refill  . aspirin EC 81 MG tablet Take 81 mg by mouth daily.    Marland Kitchen atorvastatin (LIPITOR) 80 MG tablet Take 1 tablet (80 mg total) by mouth daily. 30 tablet 1  . blood glucose meter kit and supplies KIT Dispense based on patient and insurance preference. Use up to four times daily as directed. (FOR ICD-9 250.00, 250.01). 1 each 0  . furosemide (LASIX) 40 MG tablet Take 40 mg by mouth daily. Reported on 05/21/2015    . glucose blood (TRUE METRIX BLOOD GLUCOSE TEST) test strip Use as instructed 100 each 12  . Insulin Pen Needle (BD PEN NEEDLE NANO U/F) 32G X 4 MM MISC Use 1x a day 100 each 3  .  megestrol (MEGACE) 40 MG tablet Take 1 tablet (40 mg total) by mouth 2 (two) times daily. Can increase to two tablets twice a day in the event of heavy bleeding 60 tablet 2  . metoprolol succinate (TOPROL-XL) 100 MG 24 hr tablet Take 100 mg by mouth 2 (two) times daily. Reported on 07/20/2015    . nitroGLYCERIN (NITROSTAT) 0.4 MG SL tablet Place 1 tablet (0.4 mg total) under the tongue every 5 (five) minutes as needed. For chest pain 30 tablet 0  . sodium bicarbonate 650 MG tablet Take 1,300 mg by mouth 2 (two) times daily.    Marland Kitchen glipiZIDE (GLUCOTROL) 10 MG tablet Take 1 tablet (10 mg total) by mouth 2 (two) times daily before a meal. 60 tablet 3  . Insulin Glargine (LANTUS SOLOSTAR) 100 UNIT/ML Solostar Pen Inject 10 Units into the skin daily at 10 pm. (Patient not taking: Reported on 07/20/2015) 5 pen 2   No current facility-administered medications on file prior to visit.   Family History  Problem Relation Age of Onset  . Malignant hyperthermia Mother   . Diabetes Mellitus II Mother   . Heart attack Father     Age 61  . Kidney disease Father   .  Diabetes Mellitus II Sister   . Hypertension Sister    Social History   Social History  . Marital Status: Legally Separated    Spouse Name: N/A  . Number of Children: N/A  . Years of Education: N/A   Occupational History  .      Food Therapist, nutritional   Social History Main Topics  . Smoking status: Never Smoker   . Smokeless tobacco: Never Used  . Alcohol Use: Yes     Comment: occasional drink of wine - 1 glass/week or less  . Drug Use: No  . Sexual Activity: Not on file   Other Topics Concern  . Not on file   Social History Narrative    Review of Systems: Constitutional: Negative for fever, chills, appetite change, weight loss,  Fatigue. Skin: Negative for rashes or lesions of concern. HENT: Negative for ear pain, ear discharge.nose bleeds Eyes: Negative for pain, discharge, redness, itching and visual disturbance. Neck:  Negative for pain, stiffness Respiratory: Negative for cough, shortness of breath,   Cardiovascular: Negative for chest pain, palpitations and leg swelling. Gastrointestinal: Negative for abdominal pain, nausea, vomiting, diarrhea, constipations Genitourinary: Negative for dysuria, urgency, frequency, hematuria,  Musculoskeletal: Negative for back pain, joint pain, joint  swelling, and gait problem.Negative for weakness. Neurological: Negative for dizziness, tremors, seizures, syncope,   light-headedness, numbness and headaches.  Hematological: Negative for easy bruising or bleeding Psychiatric/Behavioral: Negative for depression, anxiety, decreased concentration, confusion   Objective:   Filed Vitals:   08/21/15 1345  BP: 140/55  Pulse: 64  Temp: 98.3 F (36.8 C)  Resp: 18    Physical Exam: Constitutional: Patient appears well-developed and well-nourished. No distress. HENT: Normocephalic, atraumatic, External right and left ear normal. Oropharynx is clear and moist.  Eyes: Conjunctivae and EOM are normal. PERRLA, no scleral icterus. Neck: Normal ROM. Neck supple. No lymphadenopathy, No thyromegaly. CVS: RRR, S1/S2 +, no murmurs, no gallops, no rubs Pulmonary: Effort and breath sounds normal, no stridor, rhonchi, wheezes, rales.  Abdominal: Soft. Normoactive BS,, no distension, tenderness, rebound or guarding.  Musculoskeletal: Normal range of motion. No edema and no tenderness.  Neuro: Alert.Normal muscle tone coordination. Non-focal Skin: Skin is warm and dry. No rash noted. Not diaphoretic. No erythema. No pallor. Psychiatric: Normal mood and affect. Behavior, judgment, thought content normal.  Lab Results  Component Value Date   WBC 4.6 06/25/2015   HGB 12.0 07/24/2015   HCT 35.2* 06/25/2015   MCV 89.6 06/25/2015   PLT 273 06/25/2015   Lab Results  Component Value Date   CREATININE 10.12* 07/20/2015   BUN 72* 07/20/2015   NA 139 07/20/2015   K 5.4* 07/20/2015    CL 103 07/20/2015   CO2 25 07/20/2015    Lab Results  Component Value Date   HGBA1C 7.0* 07/20/2015   Lipid Panel     Component Value Date/Time   CHOL 194 07/20/2015 1052   TRIG 125 07/20/2015 1052   HDL 39* 07/20/2015 1052   CHOLHDL 5.0 07/20/2015 1052   VLDL 25 07/20/2015 1052   LDLCALC 130* 07/20/2015 1052       Assessment and plan:   1. Insomnia - zolpidem (AMBIEN) 5 MG tablet; Take 1 tablet (5 mg total) by mouth at bedtime as needed for sleep.  Dispense: 30 tablet; Refill: 1  2. 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  The patient was given clear instructions to go  to ER or return to medical center if symptoms don't improve, worsen or new problems develop. The patient verbalized understanding.    Micheline Chapman FNP  08/21/2015, 2:53 PM

## 2015-08-21 NOTE — Progress Notes (Signed)
Patient is here for 3 month FU  Patient denies pain at this time.  Patient has taken medication today and patient has eaten today.  Patient states her sugar was 120 this morning prior to her "big" breakfast.  Patients pressure was 112/78 this morning prior to breakfast.

## 2015-08-21 NOTE — Patient Instructions (Signed)
Come back in about two months.

## 2015-09-04 ENCOUNTER — Other Ambulatory Visit: Payer: Self-pay | Admitting: Family Medicine

## 2015-09-04 MED FILL — METOPROLOL SUCC ER 100 MG T: 100 | 30 days supply | Qty: 60 | Fill #3

## 2015-09-04 MED FILL — ?FUROSEMIDE 40 MG TABLET: 40 | 31 days supply | Qty: 31 | Fill #4 | Status: TO

## 2015-09-04 MED FILL — LIDOCAINE-PRILOCAINE CREAM: 2.5-2.5 | 15 days supply | Qty: 30 | Fill #1

## 2015-09-04 MED FILL — MEGESTROL 40 MG TABLET: 40 | 15 days supply | Qty: 60 | Fill #1

## 2015-09-07 MED FILL — glipiZIDE 10 MG TABS: 10 | 30 days supply | Qty: 60 | Fill #0 | Status: TO

## 2015-09-10 MED FILL — LIDOCAINE-PRILOCAINE CREAM: 2.5-2.5 | 15 days supply | Qty: 30 | Fill #2

## 2015-09-17 ENCOUNTER — Ambulatory Visit: Payer: Medicaid Other | Admitting: Obstetrics & Gynecology

## 2015-09-29 MED FILL — LIDOCAINE-PRILOCAINE CREAM: 2.5-2.5 | 15 days supply | Qty: 30 | Fill #3

## 2015-09-30 ENCOUNTER — Ambulatory Visit
Admission: RE | Admit: 2015-09-30 | Discharge: 2015-09-30 | Disposition: A | Discharge status: Home / Self Care | Payer: Medicaid Other | Source: Ambulatory Visit | Attending: Family Medicine | Admitting: Family Medicine

## 2015-09-30 DIAGNOSIS — Z1239 Encounter for other screening for malignant neoplasm of breast: Secondary | ICD-10-CM

## 2015-09-30 MED FILL — ZOLPIDEM TARTRATE 5 MG TAB: 5 | 30 days supply | Qty: 30 | Fill #0

## 2015-10-01 ENCOUNTER — Ambulatory Visit (INDEPENDENT_AMBULATORY_CARE_PROVIDER_SITE_OTHER): Payer: Medicaid Other | Admitting: Obstetrics and Gynecology

## 2015-10-01 ENCOUNTER — Encounter: Payer: Self-pay | Admitting: Obstetrics and Gynecology

## 2015-10-01 VITALS — BP 159/66 | HR 81 | Wt 170.1 lb

## 2015-10-01 DIAGNOSIS — A609 Anogenital herpesviral infection, unspecified: Secondary | ICD-10-CM

## 2015-10-01 DIAGNOSIS — B009 Herpesviral infection, unspecified: Secondary | ICD-10-CM

## 2015-10-01 MED ORDER — VALACYCLOVIR HCL 500 MG PO TABS: 500.0000 mg | ORAL_TABLET | Freq: Every day | ORAL | 0 refills | Status: DC

## 2015-10-01 NOTE — Patient Instructions (Signed)

## 2015-10-01 NOTE — Progress Notes (Signed)
History of present illness: Patient is a 50 year old female with past medical history of diabetes hyperlipidemia CKD on dialysis who presents today for concern for herpes outbreak. Patient reports approximately 15 years ago she had an initial outbreak followed by a second outbreak approximately 6 months later. She has not had any issues since then. She did did start on dialysis 1 month ago. Approximately 3 days ago she started with a blistering lesion on her right labia that has burning and is severely painful. She has no other lesions. She reports no trauma to the area and she is not sexually active.  Review of systems: Patient denies fevers chills, nausea vomiting, shortness of breath, chest pain, vaginal discharge  Physical exam: Vitals:   10/01/15 1546  BP: (!) 159/66  Pulse: 81    General: Well-appearing no acute distress Drinking: Soft nontender non distended Extremities: Fistula with bandage in place patient came from dialysis today Skin: Patient with approximately 0.5 cm area on her right labia that is denuded and tender to palpation. Genitourinary: No active vaginal discharge. No atrophy noted healthy vaginal mucosa  Assessment and plan: Possible herpes outbreak. As there is no exudate we will not culture today patient does have a history so we will start valacyclovir. We'll give it renal dosing 500 mg once daily for 5 days. Advised on dialysis today she should take after dialysis. Patient to call back if symptoms do not resolve after course of valacyclovir.

## 2015-10-14 MED FILL — LIDOCAINE-PRILOCAINE CREAM: 2.5-2.5 | 15 days supply | Qty: 30 | Fill #4 | Status: TO

## 2015-10-14 MED FILL — ?VALACYCLOVIR HCL 500MG TAB: 500 | 5 days supply | Qty: 5 | Fill #0

## 2015-10-21 ENCOUNTER — Other Ambulatory Visit: Payer: Self-pay | Admitting: Family Medicine

## 2015-10-21 DIAGNOSIS — I10 Essential (primary) hypertension: Secondary | ICD-10-CM

## 2015-10-21 MED FILL — ?FUROSEMIDE 40 MG TABLET: 40 | 31 days supply | Qty: 31 | Fill #5 | Status: TO

## 2015-10-22 MED FILL — METOPROLOL SUCC ER 100 MG T: 100 | 30 days supply | Qty: 60 | Fill #0 | Status: TO

## 2015-10-23 ENCOUNTER — Encounter: Payer: Self-pay | Admitting: Family Medicine

## 2015-10-23 ENCOUNTER — Ambulatory Visit (INDEPENDENT_AMBULATORY_CARE_PROVIDER_SITE_OTHER): Payer: Medicare Other | Admitting: Family Medicine

## 2015-10-23 VITALS — BP 143/70 | HR 82 | Temp 98.9°F | Resp 16 | Ht 67.0 in | Wt 170.0 lb

## 2015-10-23 DIAGNOSIS — E1369 Other specified diabetes mellitus with other specified complication: Secondary | ICD-10-CM | POA: Diagnosis not present

## 2015-10-23 DIAGNOSIS — Z114 Encounter for screening for human immunodeficiency virus [HIV]: Secondary | ICD-10-CM | POA: Diagnosis not present

## 2015-10-23 LAB — POCT GLYCOSYLATED HEMOGLOBIN (HGB A1C): Hemoglobin A1C: 8.4

## 2015-10-23 LAB — CBC WITH DIFFERENTIAL/PLATELET
BASOS ABS: 0 {cells}/uL (ref 0–200)
BASOS PCT: 0 %
EOS ABS: 116 {cells}/uL (ref 15–500)
EOS PCT: 2 %
HCT: 29.8 % — ABNORMAL LOW (ref 35.0–45.0)
HEMOGLOBIN: 9.9 g/dL — AB (ref 11.7–15.5)
LYMPHS ABS: 2436 {cells}/uL (ref 850–3900)
Lymphocytes Relative: 42 %
MCH: 31.9 pg (ref 27.0–33.0)
MCHC: 33.2 g/dL (ref 32.0–36.0)
MCV: 96.1 fL (ref 80.0–100.0)
MPV: 9.7 fL (ref 7.5–12.5)
Monocytes Absolute: 290 cells/uL (ref 200–950)
Monocytes Relative: 5 %
NEUTROS ABS: 2958 {cells}/uL (ref 1500–7800)
Neutrophils Relative %: 51 %
Platelets: 276 10*3/uL (ref 140–400)
RBC: 3.1 MIL/uL — ABNORMAL LOW (ref 3.80–5.10)
RDW: 18.4 % — ABNORMAL HIGH (ref 11.0–15.0)
WBC: 5.8 10*3/uL (ref 3.8–10.8)

## 2015-10-23 NOTE — Progress Notes (Signed)
Jade Bond, is a 50 y.o. female  [id number]:[id number]  [id number]  DOB - 03-09-1965  CC:  Chief Complaint  Patient presents with  . Diabetes       HPI: Jade Bond is a 50 y.o. female here for follow-up diabetes. She has a history of CHF, CKD (dialysis 3 times a week). She also has hypertension and GERD.She is on waiting list for kidney replacement.  Her last A1C was 7.0. Today is 8.4. She been taken off metformin by nephrologist since back on dialysis.   Health Maintenance: Reports PAP and mammogram up to date. She had a diabetic eye exam in June and has a follow-up pending. She reports she gets need immunizations at [facility].    No Known Allergies Past Medical History:  Diagnosis Date  . Chest pain    a. 2D echo 01/2010 - mild TR, LVEF 60% with normal wall thickness, trace MR, mild TR, normal RV, trace pericardial effusion. b. Normal nuc 03/2011 at [facility].  . CHF (congestive heart failure) ([place])   . CKD (chronic kidney disease), stage III   . Diabetes mellitus   . Diabetic retinopathy ([place])   . GERD (gastroesophageal reflux disease)   . Heart murmur    as a child  . Hyperlipidemia   . Hypertension   . Hypoalbuminemia   . Normocytic anemia   . Pneumonia 07/04/2013   Current Outpatient Prescriptions on File Prior to Visit  Medication Sig Dispense Refill  . aspirin EC 81 MG tablet Take 81 mg by mouth daily.    . blood glucose meter kit and supplies KIT Dispense based on patient and insurance preference. Use up to four times daily as directed. (FOR ICD-9 250.00, 250.01). 1 each 0  . furosemide (LASIX) 40 MG tablet Take 40 mg by mouth daily. Reported on 05/21/2015    . glipiZIDE (GLUCOTROL) 10 MG tablet TAKE 1 TABLET BY MOUTH TWICE DAILY BEFORE A MEAL 60 tablet 3  . Insulin Pen Needle (BD PEN NEEDLE NANO U/F) 32G X 4 MM MISC Use 1x a day 100 each 3  . megestrol (MEGACE) 40 MG tablet Take 1 tablet (40 mg total) by mouth 2 (two) times daily. Can increase to two tablets twice a  day in the event of heavy bleeding 60 tablet 2  . metoprolol succinate (TOPROL-XL) 100 MG 24 hr tablet Take 100 mg by mouth 2 (two) times daily. Reported on 07/20/2015    . nitroGLYCERIN (NITROSTAT) 0.4 MG SL tablet Place 1 tablet (0.4 mg total) under the tongue every 5 (five) minutes as needed. For chest pain 30 tablet 0  . zolpidem (AMBIEN) 5 MG tablet Take 1 tablet (5 mg total) by mouth at bedtime as needed for sleep. 30 tablet 1  . atorvastatin (LIPITOR) 80 MG tablet Take 1 tablet (80 mg total) by mouth daily. (Patient not taking: Reported on 10/23/2015) 30 tablet 1  . glucose blood (TRUE METRIX BLOOD GLUCOSE TEST) test strip Use as instructed (Patient not taking: Reported on 10/23/2015) 100 each 12  . sodium bicarbonate 650 MG tablet Take 1,300 mg by mouth 2 (two) times daily.    . valACYclovir (VALTREX) 500 MG tablet Take 1 tablet (500 mg total) by mouth daily. (Patient not taking: Reported on 10/23/2015) 5 tablet 0   No current facility-administered medications on file prior to visit.    Family History  Problem Relation Age of Onset  . Malignant hyperthermia Mother   . Diabetes Mellitus II Mother   .  Heart attack Father     Age 39  . Kidney disease Father   . Diabetes Mellitus II Sister   . Hypertension Sister    Social History   Social History  . Marital status: Legally Separated    Spouse name: N/A  . Number of children: N/A  . 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 Smoking status: Never Smoker  . Smokeless tobacco: Never Used  . Alcohol use Yes     Comment: occasional drink of wine - 1 glass/week or less  . Drug use: No  . Sexual activity: Not on file   Other Topics Concern  . Not on file   Social History Narrative  . No narrative on file    Review of Systems: Constitutional: Negative Skin: Negative HENT: Negative  Eyes: Negative  Neck: Negative Respiratory: Negative Cardiovascular:  Negative Gastrointestinal: Negative Genitourinary: Positive for dialysis 3 times a week. Musculoskeletal: Negative   Neurological: Negative  Hematological: Negative  Psychiatric/Behavioral: Negative    Objective:   Vitals:   10/23/15 1314  BP: (!) 143/70  Pulse: 82  Resp: 16  Temp: 98.9 F (37.2 C)    Physical Exam: Constitutional: Patient appears well-developed and well-nourished. No distress. HENT: Normocephalic, atraumatic, External right and left ear normal. Oropharynx is clear and moist.  Eyes: Conjunctivae and EOM are normal. PERRLA, no scleral icterus. Neck: Normal ROM. Neck supple. No lymphadenopathy, No thyromegaly. CVS: RRR, S1/S2 +, no murmurs, no gallops, no rubs Pulmonary: Effort and breath sounds normal, no stridor, rhonchi, wheezes, rales.  Abdominal: Soft. Normoactive BS,, no distension, tenderness, rebound or guarding.  Musculoskeletal: Normal range of motion. No edema and no tenderness.  Neuro: Alert.Normal muscle tone coordination. Non-focal Skin: Skin is warm and dry. No rash noted. Not diaphoretic. No erythema. No pallor. Psychiatric: Normal mood and affect. Behavior, judgment, thought content normal.  Lab Results  Component Value Date   WBC 4.6 06/25/2015   HGB 12.0 07/24/2015   HCT 35.2 (L) 06/25/2015   MCV 89.6 06/25/2015   PLT 273 06/25/2015   Lab Results  Component Value Date   CREATININE 10.12 (H) 07/20/2015   BUN 72 (H) 07/20/2015   NA 139 07/20/2015   K 5.4 (H) 07/20/2015   CL 103 07/20/2015   CO2 25 07/20/2015    Lab Results  Component Value Date   HGBA1C 8.4 10/23/2015   Lipid Panel     Component Value Date/Time   CHOL 194 07/20/2015 1052   TRIG 125 07/20/2015 1052   HDL 39 (L) 07/20/2015 1052   CHOLHDL 5.0 07/20/2015 1052   VLDL 25 07/20/2015 1052   LDLCALC 130 (H) 07/20/2015 1052       Assessment and plan:   1. Other specified diabetes mellitus with other specified complication ([place])  - POCT glycosylated  hemoglobin (Hb A1C) - CBC with Differential - COMPLETE METABOLIC PANEL WITH GFR - Lipid panel  2. Screening for HIV (human immunodeficiency virus)  - HIV antibody (with reflex)   Return in about 3 months (around 01/22/2016).  The patient was given clear instructions to go to ER or return to medical center if symptoms don't improve, worsen or new problems develop. The patient verbalized understanding.    Micheline Chapman FNP  10/23/2015, 4:13 PM

## 2015-10-23 NOTE — Patient Instructions (Signed)
Follow up in 3 months

## 2015-10-24 LAB — COMPLETE METABOLIC PANEL WITH GFR
ALBUMIN: 3.8 g/dL (ref 3.6–5.1)
ALK PHOS: 52 U/L (ref 33–115)
ALT: 5 U/L — AB (ref 6–29)
AST: 8 U/L — ABNORMAL LOW (ref 10–35)
BILIRUBIN TOTAL: 0.5 mg/dL (ref 0.2–1.2)
BUN: 29 mg/dL — AB (ref 7–25)
CO2: 29 mmol/L (ref 20–31)
CREATININE: 6.6 mg/dL — AB (ref 0.50–1.10)
Calcium: 8.9 mg/dL (ref 8.6–10.2)
Chloride: 96 mmol/L — ABNORMAL LOW (ref 98–110)
GFR, EST NON AFRICAN AMERICAN: 7 mL/min — AB (ref >=60)
GFR, Est African American: 8 mL/min — ABNORMAL LOW (ref >=60)
GLUCOSE: 202 mg/dL — AB (ref 65–99)
Potassium: 4.3 mmol/L (ref 3.5–5.3)
SODIUM: 139 mmol/L (ref 135–146)
TOTAL PROTEIN: 7.4 g/dL (ref 6.1–8.1)

## 2015-10-24 LAB — LIPID PANEL
Cholesterol: 187 mg/dL (ref 125–200)
HDL: 35 mg/dL — ABNORMAL LOW (ref >=46)
LDL CALC: 125 mg/dL (ref <130)
Total CHOL/HDL Ratio: 5.3 Ratio — ABNORMAL HIGH (ref <=5.0)
Triglycerides: 136 mg/dL (ref <150)
VLDL: 27 mg/dL (ref <30)

## 2015-10-24 LAB — HIV ANTIBODY (ROUTINE TESTING W REFLEX): HIV: NONREACTIVE

## 2015-11-03 MED FILL — ZOLPIDEM TARTRATE 5 MG TAB: 5 | 30 days supply | Qty: 30 | Fill #1

## 2015-11-04 MED FILL — MEGESTROL 40 MG TABLET: 40 | 15 days supply | Qty: 60 | Fill #2

## 2015-11-10 ENCOUNTER — Encounter (HOSPITAL_COMMUNITY): Payer: Self-pay | Admitting: Emergency Medicine

## 2015-11-10 ENCOUNTER — Emergency Department (HOSPITAL_COMMUNITY): Payer: Medicare Other

## 2015-11-10 ENCOUNTER — Observation Stay (HOSPITAL_COMMUNITY)
Admission: EM | Admit: 2015-11-10 | Discharge: 2015-11-11 | Disposition: A | Discharge status: Home / Self Care | Payer: Medicare Other | Attending: Family Medicine | Admitting: Family Medicine

## 2015-11-10 DIAGNOSIS — R0789 Other chest pain: Secondary | ICD-10-CM | POA: Diagnosis not present

## 2015-11-10 DIAGNOSIS — Z79899 Other long term (current) drug therapy: Secondary | ICD-10-CM | POA: Diagnosis not present

## 2015-11-10 DIAGNOSIS — E11319 Type 2 diabetes mellitus with unspecified diabetic retinopathy without macular edema: Secondary | ICD-10-CM | POA: Insufficient documentation

## 2015-11-10 DIAGNOSIS — R079 Chest pain, unspecified: Secondary | ICD-10-CM | POA: Diagnosis present

## 2015-11-10 DIAGNOSIS — N186 End stage renal disease: Secondary | ICD-10-CM

## 2015-11-10 DIAGNOSIS — E785 Hyperlipidemia, unspecified: Secondary | ICD-10-CM | POA: Diagnosis present

## 2015-11-10 DIAGNOSIS — Z992 Dependence on renal dialysis: Secondary | ICD-10-CM | POA: Diagnosis not present

## 2015-11-10 DIAGNOSIS — Z7984 Long term (current) use of oral hypoglycemic drugs: Secondary | ICD-10-CM | POA: Diagnosis not present

## 2015-11-10 DIAGNOSIS — I10 Essential (primary) hypertension: Secondary | ICD-10-CM | POA: Diagnosis present

## 2015-11-10 DIAGNOSIS — E1122 Type 2 diabetes mellitus with diabetic chronic kidney disease: Secondary | ICD-10-CM | POA: Diagnosis not present

## 2015-11-10 DIAGNOSIS — I132 Hypertensive heart and chronic kidney disease with heart failure and with stage 5 chronic kidney disease, or end stage renal disease: Secondary | ICD-10-CM | POA: Diagnosis not present

## 2015-11-10 DIAGNOSIS — E1165 Type 2 diabetes mellitus with hyperglycemia: Secondary | ICD-10-CM

## 2015-11-10 DIAGNOSIS — Z7982 Long term (current) use of aspirin: Secondary | ICD-10-CM | POA: Insufficient documentation

## 2015-11-10 DIAGNOSIS — Z794 Long term (current) use of insulin: Secondary | ICD-10-CM

## 2015-11-10 DIAGNOSIS — R0602 Shortness of breath: Secondary | ICD-10-CM | POA: Diagnosis not present

## 2015-11-10 DIAGNOSIS — E1143 Type 2 diabetes mellitus with diabetic autonomic (poly)neuropathy: Secondary | ICD-10-CM | POA: Insufficient documentation

## 2015-11-10 DIAGNOSIS — IMO0002 Reserved for concepts with insufficient information to code with codable children: Secondary | ICD-10-CM | POA: Diagnosis present

## 2015-11-10 DIAGNOSIS — I5032 Chronic diastolic (congestive) heart failure: Secondary | ICD-10-CM | POA: Insufficient documentation

## 2015-11-10 DIAGNOSIS — D649 Anemia, unspecified: Secondary | ICD-10-CM | POA: Diagnosis present

## 2015-11-10 LAB — BASIC METABOLIC PANEL
ANION GAP: 13 (ref 5–15)
BUN: 25 mg/dL — ABNORMAL HIGH (ref 6–20)
CALCIUM: 8.8 mg/dL — AB (ref 8.9–10.3)
CO2: 24 mmol/L (ref 22–32)
CREATININE: 4.77 mg/dL — AB (ref 0.44–1.00)
Chloride: 95 mmol/L — ABNORMAL LOW (ref 101–111)
GFR, EST AFRICAN AMERICAN: 11 mL/min — AB (ref >60)
GFR, EST NON AFRICAN AMERICAN: 10 mL/min — AB (ref >60)
Glucose, Bld: 421 mg/dL — ABNORMAL HIGH (ref 65–99)
Potassium: 3 mmol/L — ABNORMAL LOW (ref 3.5–5.1)
SODIUM: 132 mmol/L — AB (ref 135–145)

## 2015-11-10 LAB — HEPATIC FUNCTION PANEL
ALBUMIN: 3.9 g/dL (ref 3.5–5.0)
ALT: 12 U/L — ABNORMAL LOW (ref 14–54)
AST: 15 U/L (ref 15–41)
Alkaline Phosphatase: 68 U/L (ref 38–126)
Bilirubin, Direct: <0.1 mg/dL — ABNORMAL LOW (ref 0.1–0.5)
TOTAL PROTEIN: 8.1 g/dL (ref 6.5–8.1)
Total Bilirubin: 0.6 mg/dL (ref 0.3–1.2)

## 2015-11-10 LAB — CBC
HCT: 35 % — ABNORMAL LOW (ref 36.0–46.0)
HEMOGLOBIN: 12.1 g/dL (ref 12.0–15.0)
MCH: 32.9 pg (ref 26.0–34.0)
MCHC: 34.6 g/dL (ref 30.0–36.0)
MCV: 95.1 fL (ref 78.0–100.0)
PLATELETS: 266 10*3/uL (ref 150–400)
RBC: 3.68 MIL/uL — AB (ref 3.87–5.11)
RDW: 17.3 % — ABNORMAL HIGH (ref 11.5–15.5)
WBC: 5.8 10*3/uL (ref 4.0–10.5)

## 2015-11-10 LAB — I-STAT TROPONIN, ED: TROPONIN I, POC: 0.01 ng/mL (ref 0.00–0.08)

## 2015-11-10 LAB — TROPONIN I: TROPONIN I: 0.03 ng/mL — AB (ref <0.03)

## 2015-11-10 LAB — LIPASE, BLOOD: LIPASE: 72 U/L — AB (ref 11–51)

## 2015-11-10 MED ORDER — FUROSEMIDE 40 MG PO TABS
40.0000 mg | ORAL_TABLET | Freq: Every day | ORAL | Status: DC
  Administered 2015-11-11: 40 mg via ORAL
  Filled 2015-11-10 (×2): qty 1

## 2015-11-10 MED ORDER — METOPROLOL SUCCINATE ER 100 MG PO TB24
200.0000 mg | ORAL_TABLET | Freq: Every day | ORAL | Status: DC
Start: 2015-11-11 — End: 2015-11-11
  Administered 2015-11-11: 200 mg via ORAL
  Filled 2015-11-10: qty 2

## 2015-11-10 MED ORDER — CALCIUM ACETATE (PHOS BINDER) 667 MG PO CAPS
1334.0000 mg | ORAL_CAPSULE | Freq: Three times a day (TID) | ORAL | Status: DC
  Administered 2015-11-11: 1334 mg via ORAL
  Filled 2015-11-10 (×3): qty 2

## 2015-11-10 MED ORDER — MORPHINE SULFATE (PF) 2 MG/ML IV SOLN: 2.0000 mg | INTRAVENOUS | Status: DC | PRN

## 2015-11-10 MED ORDER — ASPIRIN 81 MG PO CHEW
324.0000 mg | CHEWABLE_TABLET | Freq: Once | ORAL | Status: AC
  Administered 2015-11-10: 324 mg via ORAL
  Filled 2015-11-10: qty 4

## 2015-11-10 MED ORDER — ONDANSETRON HCL 4 MG/2ML IJ SOLN: 4.0000 mg | Freq: Four times a day (QID) | INTRAMUSCULAR | Status: DC | PRN

## 2015-11-10 MED ORDER — RENA-VITE PO TABS
1.0000 | ORAL_TABLET | Freq: Every day | ORAL | Status: DC
  Administered 2015-11-11: 1 via ORAL
  Filled 2015-11-10: qty 1

## 2015-11-10 MED ORDER — GI COCKTAIL ~~LOC~~: 30.0000 mL | Freq: Four times a day (QID) | ORAL | Status: DC | PRN

## 2015-11-10 MED ORDER — MEGESTROL ACETATE 40 MG PO TABS
40.0000 mg | ORAL_TABLET | Freq: Every day | ORAL | Status: DC
  Administered 2015-11-11: 40 mg via ORAL
  Filled 2015-11-10: qty 1

## 2015-11-10 MED ORDER — GLIPIZIDE 10 MG PO TABS
10.0000 mg | ORAL_TABLET | Freq: Two times a day (BID) | ORAL | Status: DC
  Administered 2015-11-11: 10 mg via ORAL
  Filled 2015-11-10: qty 1

## 2015-11-10 MED ORDER — ZOLPIDEM TARTRATE 5 MG PO TABS: 5.0000 mg | ORAL_TABLET | Freq: Every evening | ORAL | Status: DC | PRN

## 2015-11-10 MED ORDER — ACETAMINOPHEN 325 MG PO TABS: 650.0000 mg | ORAL_TABLET | ORAL | Status: DC | PRN

## 2015-11-10 MED ORDER — NITROGLYCERIN 0.4 MG SL SUBL: 0.4000 mg | SUBLINGUAL_TABLET | SUBLINGUAL | Status: DC | PRN

## 2015-11-10 MED ORDER — ASPIRIN EC 81 MG PO TBEC
81.0000 mg | DELAYED_RELEASE_TABLET | Freq: Every day | ORAL | Status: DC
  Administered 2015-11-11: 81 mg via ORAL
  Filled 2015-11-10: qty 1

## 2015-11-10 NOTE — ED Notes (Signed)
1930

## 2015-11-10 NOTE — ED Notes (Signed)
Bed: WA13 Expected date:  Expected time:  Means of arrival:  Comments: hold 

## 2015-11-10 NOTE — ED Notes (Signed)
Will give bedside report.  

## 2015-11-10 NOTE — ED Triage Notes (Signed)
Patient reports she just left dialysis today, patient is complaining ogf increased shortness of breath and "something heavy sitting on her chest." Denies nausea, vomiting, and lightheadedness.

## 2015-11-10 NOTE — ED Provider Notes (Signed)
Emergency Department Provider Note   I have reviewed the triage vital signs and the nursing notes.   HISTORY  Chief Complaint Chest Pain and Shortness of Breath   HPI Jade Bond is a 50 y.o. female with PMH of diastolic CHF, DM, ESRD on HD, HTN, HLD presents to the emergency department for evaluation of chest heaviness. Patient states that she was being removed from the dialysis machine when she suddenly had tremendous pressure in her chest. She describes as being centrally located and nonradiating. No pleuritic pain. No recent fever, productive cough, shaking chills. No discomfort during dialysis. Patient believes that they took off more fluid than normal based on her postdialysis weight. She was on her way home when the chest pain gradually worsened and she decided to present to the emergency department. She notes a remote history of angina like pain and heart catheterization while living in [place] approximately 10 years ago with no intervention at that time.  Past Medical History:  Diagnosis Date  . Chest pain    a. 2D echo 01/2010 - mild TR, LVEF 60% with normal wall thickness, trace MR, mild TR, normal RV, trace pericardial effusion. b. Normal nuc 03/2011 at [facility].  . CHF (congestive heart failure) ([place])   . CKD (chronic kidney disease), stage III   . Diabetes mellitus   . Diabetic retinopathy ([place])   . GERD (gastroesophageal reflux disease)   . Heart murmur    as a child  . Hyperlipidemia   . Hypertension   . Hypoalbuminemia   . Normocytic anemia   . Pneumonia 07/04/2013    Patient Active Problem List   Diagnosis Date Noted  . Chest pain 11/10/2015  . CKD (chronic kidney disease) requiring chronic dialysis ([place]) 11/10/2015  . Fibroids 04/27/2015  . Menorrhagia with irregular cycle 04/16/2015  . Hypoalbuminemia   . Normocytic anemia 08/07/2013  . CAP (community acquired pneumonia) 07/04/2013  . Angina pectoris ([place]) 06/17/2013  . Bilateral lower extremity edema  06/03/2013  . Heart murmur 02/21/2013  . 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 Diabetes mellitus type 2, uncontrolled ([place]) 03/13/2012  . Allergic rhinitis 11/24/2011  . CKD (chronic kidney disease) stage 5, GFR less than 15 ml/min (HCC) 05/17/2011  . Hyperlipidemia 04/01/2011  . HTN (hypertension) 03/18/2011    Past Surgical History:  Procedure Laterality Date  . BASCILIC VEIN TRANSPOSITION Left 03/04/2015   Procedure: BASILIC VEIN TRANSPOSITION;  Surgeon: Elam Dutch, MD;  Location: [place];  Service: Vascular;  Laterality: Left;  . BREAST SURGERY     lumpectomy  . CARDIAC CATHETERIZATION  ~ 2008   negative, no intervention needed  . DILATION AND CURETTAGE OF UTERUS     x 3  . EYE SURGERY     eye injections  . EYE SURGERY     right eye surgery   . TONSILLECTOMY  1980  . TUBAL LIGATION  1995      Allergies Review of patient's allergies indicates no known allergies.  Family History  Problem Relation Age of Onset  . Malignant hyperthermia Mother   . Diabetes Mellitus II Mother   . Heart attack Father     Age 96  . Kidney disease Father   . Diabetes Mellitus II Sister   . Hypertension Sister     Social History Social History  Substance Use Topics  . Smoking status: Never Smoker  . Smokeless tobacco: Never Used  . Alcohol use Yes  Comment: occasional drink of wine - 1 glass/week or less    Review of Systems  Constitutional: No fever/chills Eyes: No visual changes. ENT: No sore throat. Cardiovascular: Positive chest heaviness.  Respiratory: Positive shortness of breath. Gastrointestinal: No abdominal pain.  No nausea, no vomiting.  No diarrhea.  No constipation. Genitourinary: Negative for dysuria. Musculoskeletal: Negative for back pain. Skin: Negative for rash. Neurological: Negative for headaches, focal weakness or numbness.  10-point ROS otherwise  negative.  ____________________________________________   PHYSICAL EXAM:  VITAL SIGNS: Temp: 98.22F RR: 16 SpO2: 100%  Pulse: 87 BP: 137/79  Constitutional: Alert and oriented. Well appearing and in no acute distress. Eyes: Conjunctivae are normal. Head: Atraumatic. Nose: No congestion/rhinnorhea. Mouth/Throat: Mucous membranes are moist.  Oropharynx non-erythematous. Neck: No stridor.   Cardiovascular: Tachycardia. Good peripheral circulation. 3/6 systolic murmur. Left arm fistula. No bleeding.  Respiratory: Normal respiratory effort.  No retractions. Lungs CTAB. Gastrointestinal: Soft and nontender. No distention.  Musculoskeletal: No lower extremity tenderness nor edema. No gross deformities of extremities. Neurologic:  Normal speech and language. No gross focal neurologic deficits are appreciated.  Skin:  Skin is warm, dry and intact. No rash noted. Psychiatric: Mood and affect are normal. Speech and behavior are normal.  ____________________________________________   LABS (all labs ordered are listed, but only abnormal results are displayed)  Labs Reviewed  BASIC METABOLIC PANEL - Abnormal; Notable for the following:       Result Value   Sodium 132 (*)    Potassium 3.0 (*)    Chloride 95 (*)    Glucose, Bld 421 (*)    BUN 25 (*)    Creatinine, Ser 4.77 (*)    Calcium 8.8 (*)    GFR calc non Af Amer 10 (*)    GFR calc Af Amer 11 (*)    All other components within normal limits  CBC - Abnormal; Notable for the following:    RBC 3.68 (*)    HCT 35.0 (*)    RDW 17.3 (*)    All other components within normal limits  HEPATIC FUNCTION PANEL - Abnormal; Notable for the following:    ALT 12 (*)    Bilirubin, Direct <0.1 (*)    All other components within normal limits  LIPASE, BLOOD - Abnormal; Notable for the following:    Lipase 72 (*)    All other components within normal limits  TROPONIN I - Abnormal; Notable for the following:    Troponin I 0.03 (*)    All  other components within normal limits  TROPONIN I - Abnormal; Notable for the following:    Troponin I 0.04 (*)    All other components within normal limits  TROPONIN I - Abnormal; Notable for the following:    Troponin I 0.04 (*)    All other components within normal limits  I-STAT TROPOININ, ED   ____________________________________________  EKG   EKG Interpretation  Date/Time:  Tuesday November 10 2015 16:20:07 EDT Ventricular Rate:  104 PR Interval:    QRS Duration: 80 QT Interval:  385 QTC Calculation: 507 R Axis:   49 Text Interpretation:  Sinus tachycardia Consider right atrial enlargement Consider left ventricular hypertrophy Anterior Q waves, possibly due to LVH No STEMI.  Compared to prior.  Confirmed by Katrenia Alkins MD, Zeki Bedrosian [phone]) on 11/10/2015 4:28:17 PM Also confirmed by Tocarra Gassen MD, Humbert Morozov [phone]), editor Rolla Plate, Joelene Millin [phone])  on 11/10/2015 4:44:56 PM       ____________________________________________  RADIOLOGY  Dg Chest 2 View  Result Date: 11/10/2015 CLINICAL DATA:  Initial evaluation for acute chest pain, shortness of breath. EXAM: CHEST  2 VIEW COMPARISON:  Prior radiograph from 09/30/2014. FINDINGS: The cardiac and mediastinal silhouettes are stable in size and contour, and remain within normal limits. The lungs are normally inflated. No airspace consolidation, pleural effusion, or pulmonary edema is identified. There is no pneumothorax. No acute osseous abnormality identified. IMPRESSION: No active cardiopulmonary disease. Electronically Signed   By: Jeannine Boga M.D.   On: 11/10/2015 17:53    ____________________________________________   PROCEDURES  Procedure(s) performed:   Procedures  None ____________________________________________   INITIAL IMPRESSION / ASSESSMENT AND PLAN / ED COURSE  Pertinent labs & imaging results that were available during my care of the patient were reviewed by me and considered in my medical decision making (see  chart for details).  Patient resents to the emergency department for evaluation of chest heaviness and resulting difficulty breathing. No pain precisely, according to patient. Labs are remarkable only for sinus tachycardia. She is afebrile with normal lung exam. She does have a systolic murmur appreciated with only trivial regurgitation noted on ECHO from 2015 (most recent).   Differential includes all life-threatening causes for chest pain. This includes but is not exclusive to acute coronary syndrome, aortic dissection, pulmonary embolism, cardiac tamponade, community-acquired pneumonia, pericarditis, musculoskeletal chest wall pain, etc.  06:38 PM Initial troponin is negative. EKG is not acutely ischemic. Chest x-ray normal. Patient is chest pain-free at this time. She is high risk for ACS with no recent ACS w/u. Will repeat EKG, ASA, and admit for chest pain observation and further risk stratification. Discussed plan with patient and family at bedside.   06:44 PM Discussed patient's case with hospitalist, Dr. Shanon Brow.  Recommend admission to obs, telemetry bed.  I will place holding orders per their request. Patient and family (if present) updated with plan. Care transferred to hospitalist service.  I reviewed all nursing notes, vitals, pertinent old records, EKGs, labs, imaging (as available).  ____________________________________________  FINAL CLINICAL IMPRESSION(S) / ED DIAGNOSES  Final diagnoses:  Chest pain, unspecified type  Chest pain     MEDICATIONS GIVEN DURING THIS VISIT:  Medications  aspirin EC tablet 81 mg (81 mg Oral Given 11/11/15 0832)  furosemide (LASIX) tablet 40 mg (40 mg Oral Given 11/11/15 1228)  metoprolol succinate (TOPROL-XL) 24 hr tablet 200 mg (200 mg Oral Given 11/11/15 0833)  zolpidem (AMBIEN) tablet 5 mg (not administered)  multivitamin (RENA-VIT) tablet 1 tablet (1 tablet Oral Given 11/11/15 0833)  calcium acetate (PHOSLO) capsule 1,334 mg (1,334 mg  Oral Given 11/11/15 0833)  nitroGLYCERIN (NITROSTAT) SL tablet 0.4 mg (not administered)  glipiZIDE (GLUCOTROL) tablet 10 mg (10 mg Oral Given 11/11/15 0833)  megestrol (MEGACE) tablet 40 mg (40 mg Oral Given 11/11/15 0832)  acetaminophen (TYLENOL) tablet 650 mg (not administered)  ondansetron (ZOFRAN) injection 4 mg (not administered)  morphine 2 MG/ML injection 2 mg (not administered)  gi cocktail (Maalox,Lidocaine,Donnatal) (not administered)  aspirin chewable tablet 324 mg (324 mg Oral Given 11/10/15 1833)     NEW OUTPATIENT MEDICATIONS STARTED DURING THIS VISIT:  None   Note:  This document was prepared using Dragon voice recognition software and may include unintentional dictation errors.  Nanda Quinton, MD Emergency Medicine   Margette Fast, MD 11/11/15 1236

## 2015-11-10 NOTE — H&P (Signed)
History and Physical    Jade Bond [id number] DOB: 08-Jun-1965 DOA: 11/10/2015  PCP: Dorena Dew, FNP  Patient coming from:  home  Chief Complaint:  Chest pain  HPI: Jade Bond is a 50 y.o. female with medical history significant of ESRD on dialysis last 3 months,  DM for over 20 years, CHF, HTN comes in with onset of substernal chest pressure that started after her dialysis session today around 3pm.  She reports it felt like "someone sitting on her chest".  Pt was going home in the dialysis [facility] and her pain was severe so she asked the [facility] Lei to drive her to the ED.  The pain lasted over 2 hours after arrival to the ED and was associated with sob.  Pain is not pleuritic.  She denies any fevers.  No swelling.  She says her dry wt is 77 kg.  Today she feels like they took too much weight off of her , her wt on arrival was 80kg and she went down to 76 kg.  She was given no ntg on arrival to ED or aspirin.  The pain continued without intervention and then spontaneously resolved.  Pt reports she is undergoing evaluation for kidney transplant at [facility] and has an outpatient stress test scheduled at [place] this Friday.  Pt had cath over ten years ago which required no intervention.  Pt referred for admission for possible ACS.  Review of Systems: As per HPI otherwise 10 point review of systems negative.   Past Medical History:  Diagnosis Date  . Chest pain    a. 2D echo 01/2010 - mild TR, LVEF 60% with normal wall thickness, trace MR, mild TR, normal RV, trace pericardial effusion. b. Normal nuc 03/2011 at [facility].  . CHF (congestive heart failure) ([place])   . CKD (chronic kidney disease), stage III   . Diabetes mellitus   . Diabetic retinopathy ([place])   . GERD (gastroesophageal reflux disease)   . Heart murmur    as a child  . Hyperlipidemia   . Hypertension   . Hypoalbuminemia   . Normocytic anemia   . Pneumonia 07/04/2013    Past Surgical History:  Procedure Laterality Date  . BASCILIC VEIN  TRANSPOSITION Left 03/04/2015   Procedure: BASILIC VEIN TRANSPOSITION;  Surgeon: Elam Dutch, MD;  Location: [place];  Service: Vascular;  Laterality: Left;  . BREAST SURGERY     lumpectomy  . CARDIAC CATHETERIZATION  ~ 2008   negative, no intervention needed  . DILATION AND CURETTAGE OF UTERUS     x 3  . EYE SURGERY     eye injections  . EYE SURGERY     right eye surgery   . TONSILLECTOMY  1980  . TUBAL LIGATION  1995     reports that she has never smoked. She has never used smokeless tobacco. She reports that she drinks alcohol. She reports that she does not use drugs.  No Known Allergies  Family History  Problem Relation Age of Onset  . Malignant hyperthermia Mother   . Diabetes Mellitus II Mother   . Heart attack Father     Age 67  . Kidney disease Father   . Diabetes Mellitus II Sister   . Hypertension Sister     Prior to Admission medications   Medication Sig Start Date End Date Taking? Authorizing Provider  aspirin EC 81 MG tablet Take 81 mg by mouth daily.   Yes Historical Provider, MD  calcium acetate (PHOSLO) 667 MG  capsule Take 1,334 mg by mouth 3 (three) times daily with meals.   Yes Historical Provider, MD  furosemide (LASIX) 40 MG tablet Take 40 mg by mouth daily.    Yes Historical Provider, MD  glipiZIDE (GLUCOTROL) 10 MG tablet Take 10 mg by mouth 2 (two) times daily before a meal.   Yes Historical Provider, MD  megestrol (MEGACE) 40 MG tablet Take 40 mg by mouth daily.   Yes Historical Provider, MD  metoprolol succinate (TOPROL-XL) 100 MG 24 hr tablet Take 200 mg by mouth daily.    Yes Historical Provider, MD  multivitamin (RENA-VIT) TABS tablet Take 1 tablet by mouth daily.   Yes Historical Provider, MD  nitroGLYCERIN (NITROSTAT) 0.4 MG SL tablet Place 0.4 mg under the tongue every 5 (five) minutes as needed for chest pain.   Yes Historical Provider, MD  zolpidem (AMBIEN) 5 MG tablet Take 1 tablet (5 mg total) by mouth at bedtime as needed for sleep.  08/21/15  Yes Micheline Chapman, NP    Physical Exam: Vitals:   11/10/15 1622 11/10/15 1758 11/10/15 1830  BP:  137/79 156/92  Pulse:  89 87  Resp:  16 16  Temp:  98.3 F (36.8 C)   TempSrc:  Oral   SpO2:  99% 100%  Weight: 76 kg (167 lb 8.8 oz)    Height: 5\' 7"  (1.702 m)     Constitutional: NAD, calm, comfortable Vitals:   11/10/15 1622 11/10/15 1758 11/10/15 1830  BP:  137/79 156/92  Pulse:  89 87  Resp:  16 16  Temp:  98.3 F (36.8 C)   TempSrc:  Oral   SpO2:  99% 100%  Weight: 76 kg (167 lb 8.8 oz)    Height: 5\' 7"  (1.702 m)     Eyes: PERRL, lids and conjunctivae normal ENMT: Mucous membranes are moist. Posterior pharynx clear of any exudate or lesions.Normal dentition.  Neck: normal, supple, no masses, no thyromegaly Respiratory: clear to auscultation bilaterally, no wheezing, no crackles. Normal respiratory effort. No accessory muscle use.  Cardiovascular: Regular rate and rhythm, no murmurs / rubs / gallops. No extremity edema. 2+ pedal pulses. No carotid bruits.  Abdomen: no tenderness, no masses palpated. No hepatosplenomegaly. Bowel sounds positive.  Musculoskeletal: no clubbing / cyanosis. No joint deformity upper and lower extremities. Good ROM, no contractures. Normal muscle tone.  Skin:  No rashes  No ulcers. No induration Neurologic: CN 2-12 grossly intact. Sensation intact, DTR normal. Strength 5/5 in all 4.  Psychiatric: Normal judgment and insight. Alert and oriented x 3. Normal mood.    Labs on Admission: I have personally reviewed following labs and imaging studies  CBC:  Recent Labs Lab 11/10/15 1658  WBC 5.8  HGB 12.1  HCT 35.0*  MCV 95.1  PLT 123456   Basic Metabolic Panel:  Recent Labs Lab 11/10/15 1658  NA 132*  K 3.0*  CL 95*  CO2 24  GLUCOSE 421*  BUN 25*  CREATININE 4.77*  CALCIUM 8.8*   GFR: Estimated Creatinine Clearance: 15.2 mL/min (by C-G formula based on SCr of 4.77 mg/dL (H)). Liver Function Tests:  Recent  Labs Lab 11/10/15 1658  AST 15  ALT 12*  ALKPHOS 68  BILITOT 0.6  PROT 8.1  ALBUMIN 3.9    Recent Labs Lab 11/10/15 1658  LIPASE 72*    Urine analysis:    Component Value Date/Time   COLORURINE YELLOW 07/04/2013 1140   APPEARANCEUR CLOUDY (A) 07/04/2013 1140   LABSPEC 1.020 05/21/2015  1007   PHURINE 6.5 05/21/2015 1007   GLUCOSEU NEGATIVE 05/21/2015 1007   HGBUR MODERATE (A) 05/21/2015 1007   BILIRUBINUR NEGATIVE 05/21/2015 1007   KETONESUR NEGATIVE 05/21/2015 1007   PROTEINUR >=300 (A) 05/21/2015 1007   UROBILINOGEN 0.2 05/21/2015 1007   NITRITE NEGATIVE 05/21/2015 1007   LEUKOCYTESUR TRACE (A) 05/21/2015 1007     Radiological Exams on Admission: Dg Chest 2 View  Result Date: 11/10/2015 CLINICAL DATA:  Initial evaluation for acute chest pain, shortness of breath. EXAM: CHEST  2 VIEW COMPARISON:  Prior radiograph from 09/30/2014. FINDINGS: The cardiac and mediastinal silhouettes are stable in size and contour, and remain within normal limits. The lungs are normally inflated. No airspace consolidation, pleural effusion, or pulmonary edema is identified. There is no pneumothorax. No acute osseous abnormality identified. IMPRESSION: No active cardiopulmonary disease. Electronically Signed   By: Jeannine Boga M.D.   On: 11/10/2015 17:53    EKG: Independently reviewed nsr no acute issues  Assessment/Plan 50 yo female with multiple risk factors for possible ACS  Principal Problem:   Chest pain- initial trop normal.  Cont to serial troponin levels overnight.  Give aspirin.  If pt rules out can continue with her stress testing which is already scheduled Friday at [facility].  Asked pt to bring in her paper work for [place] to verify for sure she actually has stress testing planned.  Active Problems:  Stable unless o/w noted   HTN (hypertension)-    Hyperlipidemia-    Diabetes mellitus type 2, uncontrolled (HCC)   Normocytic anemia   CKD (chronic kidney disease) requiring  chronic dialysis ([place])- session today, due again thursday     DVT prophylaxis:  scds Code Status:  full Family Communication:  none Disposition Plan:  Per day team Consults called:  none Admission status:  obs   Raymondo Garcialopez A MD [facility]  If 7PM-7AM, please contact night-coverage [email or web address] Password [facility]  11/10/2015, 6:47 PM

## 2015-11-11 ENCOUNTER — Observation Stay (HOSPITAL_BASED_OUTPATIENT_CLINIC_OR_DEPARTMENT_OTHER): Payer: Medicare Other

## 2015-11-11 ENCOUNTER — Other Ambulatory Visit: Payer: Self-pay | Admitting: Family Medicine

## 2015-11-11 DIAGNOSIS — R079 Chest pain, unspecified: Secondary | ICD-10-CM | POA: Diagnosis not present

## 2015-11-11 DIAGNOSIS — R0789 Other chest pain: Secondary | ICD-10-CM | POA: Diagnosis not present

## 2015-11-11 DIAGNOSIS — I10 Essential (primary) hypertension: Secondary | ICD-10-CM

## 2015-11-11 DIAGNOSIS — E1165 Type 2 diabetes mellitus with hyperglycemia: Secondary | ICD-10-CM | POA: Diagnosis not present

## 2015-11-11 DIAGNOSIS — R011 Cardiac murmur, unspecified: Secondary | ICD-10-CM

## 2015-11-11 DIAGNOSIS — E785 Hyperlipidemia, unspecified: Secondary | ICD-10-CM

## 2015-11-11 DIAGNOSIS — D649 Anemia, unspecified: Secondary | ICD-10-CM

## 2015-11-11 DIAGNOSIS — N186 End stage renal disease: Secondary | ICD-10-CM | POA: Diagnosis not present

## 2015-11-11 LAB — ECHOCARDIOGRAM COMPLETE
Height: 67 in
Weight: 2700.8 oz

## 2015-11-11 LAB — TROPONIN I
TROPONIN I: 0.04 ng/mL — AB (ref <0.03)
Troponin I: 0.04 ng/mL (ref <0.03)

## 2015-11-11 MED ORDER — GLUCOSE BLOOD VI STRP: ORAL_STRIP | 12 refills | Status: DC

## 2015-11-11 MED ORDER — TRUE METRIX METER W/DEVICE KIT: PACK | 0 refills | Status: DC

## 2015-11-11 NOTE — Care Management Obs Status (Signed)
[place] NOTIFICATION   Patient Details  Name: Jade Bond MRN: [id number] Date of Birth: 1965/08/22   Medicare Observation Status Notification Given:  Yes    MahabirJuliann Pulse, RN 11/11/2015, 11:38 AM

## 2015-11-11 NOTE — Progress Notes (Signed)
  Echocardiogram 2D Echocardiogram has been performed.  Jade Bond 11/11/2015, 9:45 AM

## 2015-11-11 NOTE — Discharge Summary (Signed)
Physician Discharge Summary  Jade Bond [id number] DOB: [birth year] DOA: 11/10/2015  PCP: Dorena Dew, FNP  Admit date: 11/10/2015 Discharge date: 11/11/2015  Admitted From: Home Disposition:  Home  Recommendations for Outpatient Follow-up:  1. Follow up with PCP in 3  days 2. Please go to hemodialysis as scheduled 10/12 3. Please go to [facility] later this week for stress testing as already scheduled  Discharge Condition: Stable CODE STATUS: FULL Diet recommendation: Renal / Heart Healthy / Carb Modified   Brief/Interim Summary: HPI: Jade Bond is a 50 y.o. female with medical history significant of ESRD on dialysis last 3 months,  DM for over 20 years, CHF, HTN comes in with onset of substernal chest pressure that started after her dialysis session today around 3pm.  She reports it felt like "someone sitting on her chest".  Pt was going home in the dialysis [facility] and her pain was severe so she asked the [facility] Lei to drive her to the ED.  The pain lasted over 2 hours after arrival to the ED and was associated with sob.  Pain is not pleuritic.  She denies any fevers.  No swelling.  She says her dry wt is 77 kg.  Today she feels like they took too much weight off of her , her wt on arrival was 80kg and she went down to 76 kg.  She was given no ntg on arrival to ED or aspirin.  The pain continued without intervention and then spontaneously resolved.  Pt reports she is undergoing evaluation for kidney transplant at [facility] and has an outpatient stress test scheduled at [facility] this Friday.  Pt had cath over ten years ago which required no intervention.  Pt referred for admission for possible ACS.  Pt was monitored for observation, there was no recurrence of chest pain symptoms, troponins remained flat and stable at 0.04.  There were no telemetry abnormalities concerning for cardiac ischemia.  Pt had a stable echocardiogram done on 10/11 with EF 70%.  See report.  Pt scheduled to get cardiac stress  testing at [facility] later this week as scheduled for her preop evaluation and workup for renal transplant consideration.  Pt strongly advised to go to [facility] as scheduled for her stress testing.    Discharge Diagnoses:  Principal Problem:   Chest pain Active Problems:   HTN (hypertension)   Hyperlipidemia   Diabetes mellitus type 2, uncontrolled (HCC)   Normocytic anemia   CKD (chronic kidney disease) requiring chronic dialysis [facility])  Discharge Instructions  Discharge Instructions    Discharge instructions    Complete by:  As directed    Please go ahead with the scheduled stress test and other studies scheduled at [facility] for later this week for your transplant evaluation.    Return if symptoms recur, worsen or new problems develop.   Check blood sugars 4 times per day   Increase activity slowly    Complete by:  As directed        Medication List    TAKE these medications   aspirin EC 81 MG tablet Take 81 mg by mouth daily.   calcium acetate 667 MG capsule Commonly known as:  PHOSLO Take 1,334 mg by mouth 3 (three) times daily with meals.   furosemide 40 MG tablet Commonly known as:  LASIX Take 40 mg by mouth daily.   glipiZIDE 10 MG tablet Commonly known as:  GLUCOTROL Take 10 mg by mouth 2 (two) times daily before a meal.   megestrol 40  MG tablet Commonly known as:  MEGACE Take 40 mg by mouth daily.   metoprolol succinate 100 MG 24 hr tablet Commonly known as:  TOPROL-XL Take 200 mg by mouth daily.   multivitamin Tabs tablet Take 1 tablet by mouth daily.   nitroGLYCERIN 0.4 MG SL tablet Commonly known as:  NITROSTAT Place 0.4 mg under the tongue every 5 (five) minutes as needed for chest pain.   zolpidem 5 MG tablet Commonly known as:  AMBIEN Take 1 tablet (5 mg total) by mouth at bedtime as needed for sleep.      No Known Allergies  Procedures/Studies: Dg Chest 2 View  Result Date: 11/10/2015 CLINICAL DATA:  Initial evaluation for acute chest pain,  shortness of breath. EXAM: CHEST  2 VIEW COMPARISON:  Prior radiograph from 09/30/2014. FINDINGS: The cardiac and mediastinal silhouettes are stable in size and contour, and remain within normal limits. The lungs are normally inflated. No airspace consolidation, pleural effusion, or pulmonary edema is identified. There is no pneumothorax. No acute osseous abnormality identified. IMPRESSION: No active cardiopulmonary disease. Electronically Signed   By: Jeannine Boga M.D.   On: 11/10/2015 17:53     Subjective: Pt says there is no further chest pain, no shortness of breath.    Discharge Exam: Vitals:   11/10/15 2002 11/11/15 0609  BP: (!) 146/77 132/74  Pulse: 84 80  Resp: 18 16  Temp: 99 F (37.2 C) 98.8 F (37.1 C)   Vitals:   11/10/15 1921 11/10/15 1930 11/10/15 2002 11/11/15 0609  BP: 121/78 125/72 (!) 146/77 132/74  Pulse: 81 87 84 80  Resp: 20 17 18 16   Temp:   99 F (37.2 C) 98.8 F (37.1 C)  TempSrc:   Oral Oral  SpO2: 100% 100% 100% 100%  Weight:   76.6 kg (168 lb 12.8 oz)   Height:   5\' 7"  (1.702 m)    Eyes: PERRL, lids and conjunctivae normal ENMT: Mucous membranes are moist. Posterior pharynx clear of any exudate or lesions.Normal dentition.  Neck: normal, supple, no masses, no thyromegaly Respiratory: clear to auscultation bilaterally, no wheezing, no crackles. Normal respiratory effort. No accessory muscle use.  Cardiovascular: Regular rate and rhythm, no murmurs / rubs / gallops. No extremity edema. 2+ pedal pulses. No carotid bruits.  Abdomen: no tenderness, no masses palpated. No hepatosplenomegaly. Bowel sounds positive.  Musculoskeletal: no clubbing / cyanosis. No joint deformity upper and lower extremities. Good ROM, no contractures. Normal muscle tone.  Skin:  No rashes  No ulcers. No induration Neurologic: CN 2-12 grossly intact. Sensation intact, DTR normal. Strength 5/5 in all 4.  Psychiatric: Normal judgment and insight. Alert and oriented x 3.  Normal mood.   The results of significant diagnostics from this hospitalization (including imaging, microbiology, ancillary and laboratory) are listed below for reference.     Microbiology: No results found for this or any previous visit (from the past 240 hour(s)).   Labs: BNP (last 3 results) No results for input(s): BNP in the last 8760 hours. Basic Metabolic Panel:  Recent Labs Lab 11/10/15 1658  NA 132*  K 3.0*  CL 95*  CO2 24  GLUCOSE 421*  BUN 25*  CREATININE 4.77*  CALCIUM 8.8*   Liver Function Tests:  Recent Labs Lab 11/10/15 1658  AST 15  ALT 12*  ALKPHOS 68  BILITOT 0.6  PROT 8.1  ALBUMIN 3.9    Recent Labs Lab 11/10/15 1658  LIPASE 72*   No results for input(s): AMMONIA  in the last 168 hours. CBC:  Recent Labs Lab 11/10/15 1658  WBC 5.8  HGB 12.1  HCT 35.0*  MCV 95.1  PLT 266   Cardiac Enzymes:  Recent Labs Lab 11/10/15 2110 11/11/15 0102 11/11/15 0528  TROPONINI 0.03* 0.04* 0.04*   BNP: Invalid input(s): POCBNP CBG: No results for input(s): GLUCAP in the last 168 hours. D-Dimer No results for input(s): DDIMER in the last 72 hours. Hgb A1c No results for input(s): HGBA1C in the last 72 hours. Lipid Profile No results for input(s): CHOL, HDL, LDLCALC, TRIG, CHOLHDL, LDLDIRECT in the last 72 hours. Thyroid function studies No results for input(s): TSH, T4TOTAL, T3FREE, THYROIDAB in the last 72 hours.  Invalid input(s): FREET3 Anemia work up No results for input(s): VITAMINB12, FOLATE, FERRITIN, TIBC, IRON, RETICCTPCT in the last 72 hours. Urinalysis    Component Value Date/Time   COLORURINE YELLOW 07/04/2013 1140   APPEARANCEUR CLOUDY (A) 07/04/2013 1140   LABSPEC 1.020 05/21/2015 1007   PHURINE 6.5 05/21/2015 1007   GLUCOSEU NEGATIVE 05/21/2015 1007   HGBUR MODERATE (A) 05/21/2015 1007   BILIRUBINUR NEGATIVE 05/21/2015 1007   KETONESUR NEGATIVE 05/21/2015 1007   PROTEINUR >=300 (A) 05/21/2015 1007   UROBILINOGEN  0.2 05/21/2015 1007   NITRITE NEGATIVE 05/21/2015 1007   LEUKOCYTESUR TRACE (A) 05/21/2015 1007   Sepsis Labs Invalid input(s): PROCALCITONIN,  WBC,  LACTICIDVEN Microbiology No results found for this or any previous visit (from the past 240 hour(s)).   Time coordinating discharge: 28 minutes  SIGNED:  Irwin Brakeman, MD  [facility] 11/11/2015, 11:05 AM Pager   If 7PM-7AM, please contact night-coverage [email or web address] Password [email or web address]

## 2015-11-11 NOTE — Care Management Note (Signed)
Case Management Note  Patient Details  Name: Jade Bond MRN: [id number] Date of Birth: Jun 27, 1965  Subjective/Objective:  50 y/o f admitted w/chest pain. From home.                  Action/Plan:d/c home no needs or orders.   Expected Discharge Date:   (unknown)               Expected Discharge Plan:  Home/Self Care  In-House Referral:     Discharge planning Services  CM Consult  Post Acute Care Choice:    Choice offered to:     DME Arranged:    DME Agency:     HH Arranged:    [place] Agency:     Status of Service:  Completed, signed off  If discussed at [employer] of Stay Meetings, dates discussed:    Additional Comments:  Dessa Phi, RN 11/11/2015, 11:39 AM

## 2015-11-16 ENCOUNTER — Telehealth: Payer: Self-pay

## 2015-11-16 ENCOUNTER — Other Ambulatory Visit: Payer: Self-pay

## 2015-11-16 MED ORDER — GLUCOSE BLOOD VI STRP: ORAL_STRIP | 12 refills | Status: DC

## 2015-11-16 MED ORDER — METOPROLOL SUCCINATE ER 100 MG PO TB24: 200.0000 mg | ORAL_TABLET | Freq: Every day | ORAL | 3 refills | Status: DC

## 2015-11-16 MED ORDER — TRUE METRIX METER W/DEVICE KIT: PACK | 0 refills | Status: DC

## 2015-11-16 NOTE — Telephone Encounter (Signed)
Called and spoke to patient and refilled medication to new pharmacy. Thanks!

## 2015-12-02 ENCOUNTER — Emergency Department (HOSPITAL_COMMUNITY)
Admission: EM | Admit: 2015-12-02 | Discharge: 2015-12-02 | Disposition: A | Discharge status: Home / Self Care | Payer: Medicare Other | Attending: Emergency Medicine | Admitting: Emergency Medicine

## 2015-12-02 ENCOUNTER — Encounter (HOSPITAL_COMMUNITY): Payer: Self-pay | Admitting: Emergency Medicine

## 2015-12-02 DIAGNOSIS — I132 Hypertensive heart and chronic kidney disease with heart failure and with stage 5 chronic kidney disease, or end stage renal disease: Secondary | ICD-10-CM | POA: Diagnosis not present

## 2015-12-02 DIAGNOSIS — Z7984 Long term (current) use of oral hypoglycemic drugs: Secondary | ICD-10-CM | POA: Diagnosis not present

## 2015-12-02 DIAGNOSIS — E1122 Type 2 diabetes mellitus with diabetic chronic kidney disease: Secondary | ICD-10-CM | POA: Insufficient documentation

## 2015-12-02 DIAGNOSIS — N939 Abnormal uterine and vaginal bleeding, unspecified: Secondary | ICD-10-CM | POA: Diagnosis not present

## 2015-12-02 DIAGNOSIS — Z7982 Long term (current) use of aspirin: Secondary | ICD-10-CM | POA: Insufficient documentation

## 2015-12-02 DIAGNOSIS — N185 Chronic kidney disease, stage 5: Secondary | ICD-10-CM | POA: Insufficient documentation

## 2015-12-02 DIAGNOSIS — I509 Heart failure, unspecified: Secondary | ICD-10-CM | POA: Diagnosis not present

## 2015-12-02 DIAGNOSIS — Z79899 Other long term (current) drug therapy: Secondary | ICD-10-CM | POA: Diagnosis not present

## 2015-12-02 LAB — CBC
HEMATOCRIT: 33.3 % — AB (ref 36.0–46.0)
HEMOGLOBIN: 11.3 g/dL — AB (ref 12.0–15.0)
MCH: 33.3 pg (ref 26.0–34.0)
MCHC: 33.9 g/dL (ref 30.0–36.0)
MCV: 98.2 fL (ref 78.0–100.0)
Platelets: 187 10*3/uL (ref 150–400)
RBC: 3.39 MIL/uL — AB (ref 3.87–5.11)
RDW: 13.4 % (ref 11.5–15.5)
WBC: 5.2 10*3/uL (ref 4.0–10.5)

## 2015-12-02 LAB — POC URINE PREG, ED: Preg Test, Ur: NEGATIVE

## 2015-12-02 NOTE — ED Provider Notes (Signed)
[facility] DEPT Provider Note   CSN: [id number] Arrival date & time: 12/02/15  1434     History   Chief Complaint Chief Complaint  Patient presents with  . Vaginal Bleeding    HPI Jade Bond is a 50 y.o. female.  50 year old female with extensive past medical history below who presents with vaginal bleeding. The patient reports that for the past one month, she has had waxing and waning vaginal bleeding sometimes requiring going through multiple pads, sometimes just spotting. She has a history of uterine fibroids and states that she was put on hormone therapy several months ago by her OB/GYN. She normally takes this twice daily and was instructed to double the dose of her bleeding was heavier. She doubled the dose on 1 day but since then has returned to her normal dosing. She had uterine cramps a few days ago but denies any pain currently. She is not sexually active. She denies any other symptoms. No lightheadedness, chest pain, or shortness of breath.   The history is provided by the patient.    Past Medical History:  Diagnosis Date  . Chest pain    a. 2D echo 01/2010 - mild TR, LVEF 60% with normal wall thickness, trace MR, mild TR, normal RV, trace pericardial effusion. b. Normal nuc 03/2011 at [facility].  . CHF (congestive heart failure) ([place])   . CKD (chronic kidney disease), stage III   . Diabetes mellitus   . Diabetic retinopathy ([place])   . GERD (gastroesophageal reflux disease)   . Heart murmur    as a child  . Hyperlipidemia   . Hypertension   . Hypoalbuminemia   . Normocytic anemia   . Pneumonia 07/04/2013    Patient Active Problem List   Diagnosis Date Noted  . Chest pain 11/10/2015  . CKD (chronic kidney disease) requiring chronic dialysis ([place]) 11/10/2015  . Fibroids 04/27/2015  . Menorrhagia with irregular cycle 04/16/2015  . Hypoalbuminemia   . Normocytic anemia 08/07/2013  . CAP (community acquired pneumonia) 07/04/2013  . Angina pectoris ([place])  06/17/2013  . Bilateral lower extremity edema 06/03/2013  . Heart murmur 02/21/2013  . 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 Diabetes mellitus type 2, uncontrolled ([place]) 03/13/2012  . Allergic rhinitis 11/24/2011  . CKD (chronic kidney disease) stage 5, GFR less than 15 ml/min (HCC) 05/17/2011  . Hyperlipidemia 04/01/2011  . HTN (hypertension) 03/18/2011    Past Surgical History:  Procedure Laterality Date  . BASCILIC VEIN TRANSPOSITION Left 03/04/2015   Procedure: BASILIC VEIN TRANSPOSITION;  Surgeon: Elam Dutch, MD;  Location: [facility];  Service: Vascular;  Laterality: Left;  . BREAST SURGERY     lumpectomy  . CARDIAC CATHETERIZATION  ~ 2008   negative, no intervention needed  . DILATION AND CURETTAGE OF UTERUS     x 3  . EYE SURGERY     eye injections  . EYE SURGERY     right eye surgery   . TONSILLECTOMY  1980  . TUBAL LIGATION  1995    OB History    No data available       Home Medications    Prior to Admission medications   Medication Sig Start Date End Date Taking? Authorizing Provider  aspirin EC 81 MG tablet Take 81 mg by mouth daily.    Historical Provider, MD  Blood Glucose Monitoring Suppl (TRUE METRIX METER) w/Device KIT Check blood sugar twice a day in AM and 2 hours after dinner. 11/16/15  Micheline Chapman, NP  calcium acetate (PHOSLO) 667 MG capsule Take 1,334 mg by mouth 3 (three) times daily with meals.    Historical Provider, MD  furosemide (LASIX) 40 MG tablet Take 40 mg by mouth daily.     Historical Provider, MD  glipiZIDE (GLUCOTROL) 10 MG tablet Take 10 mg by mouth 2 (two) times daily before a meal.    Historical Provider, MD  glucose blood (TRUE METRIX BLOOD GLUCOSE TEST) test strip Use as instructed 11/16/15   Micheline Chapman, NP  megestrol (MEGACE) 40 MG tablet Take 40 mg by mouth daily.    Historical Provider, MD  metoprolol succinate (TOPROL-XL) 100 MG 24 hr tablet Take 2 tablets (200 mg  total) by mouth daily. 11/16/15   Micheline Chapman, NP  multivitamin (RENA-VIT) TABS tablet Take 1 tablet by mouth daily.    Historical Provider, MD  nitroGLYCERIN (NITROSTAT) 0.4 MG SL tablet Place 0.4 mg under the tongue every 5 (five) minutes as needed for chest pain.    Historical Provider, MD  zolpidem (AMBIEN) 5 MG tablet Take 1 tablet (5 mg total) by mouth at bedtime as needed for sleep. 08/21/15   Micheline Chapman, NP    Family History Family History  Problem Relation Age of Onset  . Malignant hyperthermia Mother   . Diabetes Mellitus II Mother   . Heart attack Father     Age 40  . Kidney disease Father   . Diabetes Mellitus II Sister   . Hypertension Sister     Social History Social History  Substance Use Topics  . Smoking status: Never Smoker  . Smokeless tobacco: Never Used  . Alcohol use Yes     Comment: occasional drink of wine - 1 glass/week or less     Allergies   Review of patient's allergies indicates no known allergies.   Review of Systems Review of Systems 10 Systems reviewed and are negative for acute change except as noted in the HPI.   Physical Exam Updated Vital Signs BP 144/73 (BP Location: Left Arm)   Pulse 64   Temp 98.3 F (36.8 C) (Oral)   Resp 18   LMP 10/22/2015   SpO2 99%   Physical Exam  Constitutional: She is oriented to person, place, and time. She appears well-developed and well-nourished. No distress.  HENT:  Head: Normocephalic and atraumatic.  Moist mucous membranes  Eyes: Conjunctivae are normal. Pupils are equal, round, and reactive to light.  Neck: Neck supple.  Cardiovascular: Normal rate, regular rhythm and normal heart sounds.   No murmur heard. Pulmonary/Chest: Effort normal and breath sounds normal.  Abdominal: Soft. Bowel sounds are normal. She exhibits no distension. There is no tenderness.  Genitourinary: Vagina normal and uterus normal.  Genitourinary Comments: Moderate amount of blood in vaginal vault, no  cervical motion or adnexal tenderness  Musculoskeletal: She exhibits no edema.  Neurological: She is alert and oriented to person, place, and time.  Fluent speech  Skin: Skin is warm and dry.  Psychiatric: She has a normal mood and affect. Judgment normal.  Nursing note and vitals reviewed.    ED Treatments / Results  Labs (all labs ordered are listed, but only abnormal results are displayed) Labs Reviewed  CBC - Abnormal; Notable for the following:       Result Value   RBC 3.39 (*)    Hemoglobin 11.3 (*)    HCT 33.3 (*)    All other components within normal limits  POC  URINE PREG, ED  GC/CHLAMYDIA PROBE AMP ([place]) NOT AT [facility]    EKG  EKG Interpretation None       Radiology No results found.  Procedures Procedures (including critical care time)  Medications Ordered in ED Medications - No data to display   Initial Impression / Assessment and Plan / ED Course  I have reviewed the triage vital signs and the nursing notes.  Pertinent labs that were available during my care of the patient were reviewed by me and considered in my medical decision making (see chart for details).  Clinical Course   Pt w/ h/o uterine fibroids on hormone therapy p/w 1 month of vaginal bleeding. Well appearing and comfortable on exam, no abd tenderness. Some vaginal bleeding on pelvic exam but otherwise unremarkable. Hgb is 11.3, similar to previous. I have reviewed her current hormone Rx given by her OBGYN which states she can take 2 tablets twice daily, which I have instructed her to do until she follows up with her OBGYN. Instructed to contact clinic for next available appointment. Also instructed to start iron supplementation. Reviewed return precautions including lightheadedness, shortness of breath, or severe bleeding. Patient voiced understanding and was discharged in satisfactory condition.  Final Clinical Impressions(s) / ED Diagnoses   Final diagnoses:  Abnormal vaginal  bleeding    New Prescriptions Discharge Medication List as of 12/02/2015  4:58 PM       Sharlett Iles, MD 12/02/15 1706

## 2015-12-02 NOTE — ED Triage Notes (Signed)
Per patient, states having a menstrual cycle for over a month-possibly going through menopause

## 2015-12-03 LAB — GC/CHLAMYDIA PROBE AMP (~~LOC~~) NOT AT ARMC
CHLAMYDIA, DNA PROBE: NEGATIVE
Neisseria Gonorrhea: NEGATIVE

## 2015-12-10 ENCOUNTER — Other Ambulatory Visit: Payer: Self-pay | Admitting: Family Medicine

## 2015-12-10 DIAGNOSIS — E118 Type 2 diabetes mellitus with unspecified complications: Secondary | ICD-10-CM

## 2015-12-11 ENCOUNTER — Encounter: Payer: Self-pay | Admitting: Family Medicine

## 2015-12-11 ENCOUNTER — Ambulatory Visit (INDEPENDENT_AMBULATORY_CARE_PROVIDER_SITE_OTHER): Payer: Medicare Other | Admitting: Family Medicine

## 2015-12-11 VITALS — Ht 67.0 in | Wt 171.6 lb

## 2015-12-11 DIAGNOSIS — E118 Type 2 diabetes mellitus with unspecified complications: Secondary | ICD-10-CM | POA: Diagnosis not present

## 2015-12-11 LAB — GLUCOSE, CAPILLARY: GLUCOSE-CAPILLARY: 185 mg/dL — AB (ref 65–99)

## 2015-12-11 MED ORDER — GUAIFENESIN-CODEINE 100-10 MG/5ML PO SOLN: 5.0000 mL | Freq: Three times a day (TID) | ORAL | 0 refills | Status: DC | PRN

## 2015-12-11 MED ORDER — INSULIN GLARGINE 100 UNIT/ML SOLOSTAR PEN: PEN_INJECTOR | SUBCUTANEOUS | 99 refills | Status: DC

## 2015-12-11 NOTE — Patient Instructions (Signed)
Start Lantus at 10 units at 10 pm. Increase by one unit a night until fasting AM sugars below 120.

## 2015-12-11 NOTE — Progress Notes (Signed)
Patient is here for FU A1C  Patient has not taken medication today. Patient has not eaten today.  Patient burst a blood vessel in her right eye and states the vision is blocked by a black cloud.

## 2015-12-14 ENCOUNTER — Encounter: Payer: Self-pay | Admitting: Physician Assistant

## 2015-12-14 ENCOUNTER — Ambulatory Visit (INDEPENDENT_AMBULATORY_CARE_PROVIDER_SITE_OTHER): Payer: Medicare Other | Admitting: Physician Assistant

## 2015-12-14 VITALS — BP 170/102 | HR 79 | Ht 67.0 in | Wt 180.8 lb

## 2015-12-14 DIAGNOSIS — I10 Essential (primary) hypertension: Secondary | ICD-10-CM | POA: Diagnosis not present

## 2015-12-14 DIAGNOSIS — R9439 Abnormal result of other cardiovascular function study: Secondary | ICD-10-CM

## 2015-12-14 DIAGNOSIS — Z01818 Encounter for other preprocedural examination: Secondary | ICD-10-CM | POA: Diagnosis not present

## 2015-12-14 DIAGNOSIS — N185 Chronic kidney disease, stage 5: Secondary | ICD-10-CM

## 2015-12-14 DIAGNOSIS — Z794 Long term (current) use of insulin: Secondary | ICD-10-CM

## 2015-12-14 DIAGNOSIS — E1165 Type 2 diabetes mellitus with hyperglycemia: Secondary | ICD-10-CM

## 2015-12-14 DIAGNOSIS — R011 Cardiac murmur, unspecified: Secondary | ICD-10-CM

## 2015-12-14 MED ORDER — DIPHENHYDRAMINE HCL 50 MG PO TABS: 50.0000 mg | ORAL_TABLET | Freq: Every evening | ORAL | 0 refills | Status: DC | PRN

## 2015-12-14 MED ORDER — PREDNISONE 20 MG PO TABS: 20.0000 mg | ORAL_TABLET | Freq: Every day | ORAL | 0 refills | Status: DC

## 2015-12-14 NOTE — Patient Instructions (Addendum)
Medication Instructions:  Your physician recommends that you continue on your current medications as directed. Please refer to the Current Medication list given to you today.   Labwork: Your physician recommends that you have lab work today: bmet/cbc/pt/inr/ptt   Testing/Procedures: -None  Follow-Up: Your physician recommends that you keep your scheduled  follow-up appointment with Ermalinda Barrios, PA Your physician recommends that you keep your schedule  follow-up appointment with Dr. Harrington Challenger.   Any Other Special Instructions Will Be Listed Below (If Applicable).  Your provider has recommended a cardiac catherization  You are scheduled for a cardiac catheterization on Wednesday, November 15 with Dr. Claiborne Billings or associate.  Please arrive at the [facility] (Main Entrance[facility] at [facility] at [address], [place] Stay on Wednesday, November 15 at 10:00am.    Special note: Every effort is made to have your procedure done on time.   Please understand that emergencies sometimes delay a scheduled   procedure.  No food or drink after midnight on Tuesday, November 14. On the morning of your procedure, take your medications .   You may take your morning medications with a sip of water on the day of your procedure. Take prednisone 60 mg (3 tablets) at 6 pm the night before, 12 and 1 hour prior to procedure Take benadryl (50 mg ) one hour prior to procedure.  Medications to HOLD - insulin and glipizide on the day of procedure and only take 1/2 dose of insulin the night before.   Plan for a one night stay -- bring personal belongings.  Bring a current list of your medications and current insurance cards.  You MUST have a responsible person to drive you home. Someone MUST be with you the first 24 hours after you arrive home or your discharge will be delayed. Wear clothes that are easy to get on and off and wear slip on shoes.    Coronary Angiogram A coronary  angiogram, also called coronary angiography, is an X-ray procedure used to look at the arteries in the heart. In this procedure, a dye (contrast dye) is injected through a long, hollow tube (catheter). The catheter is about the size of a piece of cooked spaghetti and is inserted through your groin, wrist, or arm. The dye is injected into each artery, and X-rays are then taken to show if there is a blockage in the arteries of your heart.  LET [facility] CARE PROVIDER KNOW ABOUT:  Any allergies you have, including allergies to shellfish or contrast dye.   All medicines you are taking, including vitamins, herbs, eye drops, creams, and over-the-counter medicines.   Previous problems you or members of your family have had with the use of anesthetics.   Any blood disorders you have.   Previous surgeries you have had.  History of kidney problems or failure.   Other medical conditions you have.  RISKS AND COMPLICATIONS  Generally, a coronary angiogram is a safe procedure. However, about 1 person out of 1000 can have problems that may include:  Allergic reaction to the dye.  Bleeding/bruising from the access site or other locations.  Kidney injury, especially in people with impaired kidney function.  Stroke (rare).  Heart attack (rare).  Irregular rhythms (rare)  Death (rare)  BEFORE THE PROCEDURE   Do not eat or drink anything after midnight the night before the procedure or as directed by your health care provider.   Ask your health care provider about changing  or stopping your regular medicines. This is especially important if you are taking diabetes medicines or blood thinners.  PROCEDURE  You may be given a medicine to help you relax (sedative) before the procedure. This medicine is given through an intravenous (IV) access tube that is inserted into one of your veins.   The area where the catheter will be inserted will be washed and shaved. This is usually done in  the groin but may be done in the fold of your arm (near your elbow) or in the wrist.   A medicine will be given to numb the area where the catheter will be inserted (local anesthetic).   The health care provider will insert the catheter into an artery. The catheter will be guided by using a special type of X-ray (fluoroscopy) of the blood vessel being examined.   A special dye will then be injected into the catheter, and X-rays will be taken. The dye will help to show where any narrowing or blockages are located in the heart arteries.    AFTER THE PROCEDURE   If the procedure is done through the leg, you will be kept in bed lying flat for several hours. You will be instructed to not bend or cross your legs.  The insertion site will be checked frequently.   The pulse in your feet or wrist will be checked frequently.   Additional blood tests, X-rays, and an electrocardiogram may be done.        If you need a refill on your cardiac medications before your next appointment, please call your pharmacy.

## 2015-12-14 NOTE — Addendum Note (Signed)
Addended by: Imogene Burn on: 12/14/2015 02:31 PM   Modules accepted: Orders, SmartSet

## 2015-12-14 NOTE — Progress Notes (Signed)
Cardiology Office Note    Date:  12/14/2015   ID:  Jade Bond, DOB 06-19-1965, MRN [id number]  PCP:  Dorena Dew, FNP  Cardiologist: Dr. Harrington Challenger  Chief Complaint  Patient presents with  . Pre-op Exam    History of Present Illness:  Jade Bond is a 50 y.o. female who Dr. Harrington Challenger saw in 2015 for lower extremity edema and chest pain and dyspnea on exertion. She had a normal nuclear stress test in 2013 that was normal and says she had a heart catheterization about 10 yrs ago in [place] without significant findings but we had no records. She had moved from [place]. She also has history of uncontrolled DM, HTN, HLD, CKD stage III, normocytic anemia (heavy menses). 2-D echo 12/2013 showed normal LV EF 60-65% with grade 1 DD. Nuclear stress test 11/6 less 15 was normal.  Patient now has end-stage renal disease on hemodialysis and is awaiting kidney transplant. She had an abnormal stress echo at [facility] with 2 mm ST depression. Echo was normal.  One month ago after dialysis she felt like she had too much fluid taken off. She developed chest tightness when leaving and went to [facility] ER where w/u was negative. 2-D echo was done and showed severe concentric hypertrophy LVEF 75-80% with dynamic obstruction at the outflow track grade 1 DD. It was unchanged from prior echoes. Raises her 44 yr old grandson so very active with him. Denies exertional chest tightness, pressure, dyspnea, dyspnea on exertion, dizziness or presyncope. His scheduled for eye surgery this Wednesday but needs clearance first. Mother  With heart disease-doesn't know specifics. Never smoked. BP high today but agditated with her ride today.     Past Medical History:  Diagnosis Date  . Chest pain    a. 2D echo 01/2010 - mild TR, LVEF 60% with normal wall thickness, trace MR, mild TR, normal RV, trace pericardial effusion. b. Normal nuc 03/2011 at [facility].  . CHF (congestive heart failure) ([place])   . CKD (chronic kidney disease), stage III     . Diabetes mellitus   . Diabetic retinopathy ([place])   . GERD (gastroesophageal reflux disease)   . Heart murmur    as a child  . Hyperlipidemia   . Hypertension   . Hypoalbuminemia   . Normocytic anemia   . Pneumonia 07/04/2013    Past Surgical History:  Procedure Laterality Date  . BASCILIC VEIN TRANSPOSITION Left 03/04/2015   Procedure: BASILIC VEIN TRANSPOSITION;  Surgeon: Elam Dutch, MD;  Location: [place];  Service: Vascular;  Laterality: Left;  . BREAST SURGERY     lumpectomy  . CARDIAC CATHETERIZATION  ~ 2008   negative, no intervention needed  . DILATION AND CURETTAGE OF UTERUS     x 3  . EYE SURGERY     eye injections  . EYE SURGERY     right eye surgery   . TONSILLECTOMY  1980  . TUBAL LIGATION  1995    Current Medications: Outpatient Medications Prior to Visit  Medication Sig Dispense Refill  . aspirin EC 81 MG tablet Take 81 mg by mouth daily.    . Blood Glucose Monitoring Suppl (TRUE METRIX METER) w/Device KIT Check blood sugar twice a day in AM and 2 hours after dinner. 1 kit 0  . calcium acetate (PHOSLO) 667 MG capsule Take 1,334 mg by mouth 3 (three) times daily with meals.    . furosemide (LASIX) 40 MG tablet Take 40 mg by mouth daily.     Marland Kitchen  glipiZIDE (GLUCOTROL) 10 MG tablet Take 10 mg by mouth 2 (two) times daily before a meal.    . glucose blood (TRUE METRIX BLOOD GLUCOSE TEST) test strip Use as instructed 100 each 12  . metoprolol succinate (TOPROL-XL) 100 MG 24 hr tablet Take 2 tablets (200 mg total) by mouth daily. 60 tablet 3  . multivitamin (RENA-VIT) TABS tablet Take 1 tablet by mouth daily.    . nitroGLYCERIN (NITROSTAT) 0.4 MG SL tablet Place 0.4 mg under the tongue every 5 (five) minutes as needed for chest pain.    Marland Kitchen zolpidem (AMBIEN) 5 MG tablet Take 1 tablet (5 mg total) by mouth at bedtime as needed for sleep. 30 tablet 1  . Insulin Glargine (LANTUS SOLOSTAR) 100 UNIT/ML Solostar Pen Inject into skin at 10 pm. Increase by one unit a night  until fasting blood sugar under 120. Then stay at that dosage. (Patient not taking: Reported on 12/14/2015) 5 pen PRN  . megestrol (MEGACE) 40 MG tablet Take 40 mg by mouth daily.     No facility-administered medications prior to visit.      Allergies:   Other   Social History   Social History  . Marital status: Legally Separated    Spouse name: N/A  . Number of children: N/A  . 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 Smoking status: Never Smoker  . Smokeless tobacco: Never Used  . Alcohol use Yes     Comment: occasional drink of wine - 1 glass/week or less  . Drug use: No  . Sexual activity: Not Asked   Other Topics Concern  . None   Social History Narrative  . None     Family History:  The patient's family history includes Diabetes Mellitus II in her mother and sister; Heart attack in her father; Hypertension in her sister; Kidney disease in her father; Malignant hyperthermia in her mother.   ROS:   Please see the history of present illness.    Review of Systems  Constitution: Negative.  HENT: Negative.   Eyes: Positive for vision loss in right eye and visual disturbance.  Cardiovascular: Negative.   Respiratory: Negative.   Hematologic/Lymphatic: Negative.   Musculoskeletal: Negative.  Negative for joint pain.  Gastrointestinal: Negative.   Genitourinary: Negative.   Neurological: Negative.    All other systems reviewed and are negative.   PHYSICAL EXAM:   VS:  BP (!) 170/102   Pulse 79   Ht _0  (1.702 m)   Wt 180 lb 12.8 oz (82 kg)   LMP 10/22/2015 Comment: patient has been bleeding since  BMI 28.32 kg/m   Physical Exam  GEN: Well nourished, well developed, in no acute distress  Neck: no JVD, carotid bruits, or masses Cardiac:RRR; 3/6 systolic murmurs LSB, no rubs, or gallops  Respiratory:  clear to auscultation bilaterally, normal work of breathing GI: soft, nontender,  nondistended, + BS Ext: without cyanosis, clubbing, or edema, Good distal pulses bilaterally MS: no deformity or atrophy  Skin: warm and dry, no rash Psych: euthymic mood, full affect  Wt Readings from Last 3 Encounters:  12/14/15 180 lb 12.8 oz (82 kg)  12/11/15 171 lb 9.6 oz (77.8 kg)  11/10/15 168 lb 12.8 oz (76.6 kg)      Studies/Labs Reviewed:   EKG:  EKG is not  ordered today.  The ekg reviewed from 11/11/15 demonstrates normal  sinus rhythm with LVH, no acute change.  Recent Labs: 11/10/2015: ALT 12; BUN 25; Creatinine, Ser 4.77; Potassium 3.0; Sodium 132 12/02/2015: Hemoglobin 11.3; Platelets 187   Lipid Panel    Component Value Date/Time   CHOL 187 10/23/2015 1357   TRIG 136 10/23/2015 1357   HDL 35 (L) 10/23/2015 1357   CHOLHDL 5.3 (H) 10/23/2015 1357   VLDL 27 10/23/2015 1357   LDLCALC 125 10/23/2015 1357   LDLDIRECT 236.6 11/29/2013 1529    Additional studies/ records that were reviewed today include:  Echo stress test at [facility] 11/13/15 LVEF 55% patient exercised 5 minutes 50 seconds and had 2 mm ST segment depression. There is normal resting study and no change with stress hyperdynamic at both times. No change in LVEF with increased heart rate. Hydrated hypertensive response. STRESS ECHOCARDIOGRAPHY ------------------------------------------------------     Protocol: Treadmill Darnell Level)  Drugs: None  Target Heart Rate: 145Maximum Predicted Heart Rate: 171  Resting ECG: Normal    TYPE STAGETIMEHRBPRPESPO2 COMMENTS  -------- ----- --------- --- ------- ----- ---- ------------------------------  Laurence Spates 147/ 74  Stress 1180 sec. 139 144/ 79 15/20  Stress 2150 sec. 155 208/ 86 17/20  Recovery 11 min. 127 211/ 61  Recovery 22 min. 110 218/ 66  Recovery 34 min.96 191/ 64  Recovery 46 min.93 176/ 62  Recovery 58 min.96  149/ 75  Recovery 6 10 min.96 153/ 74    Stress Duration: 5.50 minutes.Max Stress H.R: 155  Target Heart Rate (145) Achieved: Yes  Max. workload of7.00METs was achieved during exercise.  HR Response: Appropriate  BP Response: Resting hypertension - exaggerated response    WALL SEGMENT FINDINGS --------------------------------------------------------     RestStress   --------------- ---------------  Anterior Septum Basal: NormalNormal  Mid: NormalNormal  Apical Septum: NormalNormal    Anterior Wall Basal: NormalNormal  Mid: NormalNormal   Apical: NormalNormal     Lateral Wall Basal: NormalNormal  Mid: NormalNormal   Apical: NormalNormal    Posterior Wall Basal: NormalNormal  Mid: NormalNormal    Inferior Wall Basal: NormalNormal  Mid: NormalNormal   Apical: NormalNormal    Inferior Septum Basal: NormalNormal  Mid: NormalNormal     EF: >55%>55%    RestLow StressPeakRecovery  MR: TRIVIAL MR  LVOT Vel:    ADDITIONAL FINDINGS ----------------------------------------------------------    Chest Discomfort: None  Arrhythmia: None  Termination Reason: Fatigue   Complications: None    STRESS ECG RESULTS -----------------------------------------------------------    ECG Results: POSITIVE, 2 mm. ST-SEGMENT DEPRESSION.    INTERPRETATION ---------------------------------------------------------------    ABNORMAL STRESS TEST.  NO  DOPPLER PERFORMED FOR VALVULAR REGURGITATION  NO DOPPLER PERFORMED FOR VALVULAR STENOSIS  Note: NORMAL RESTING STUDY, NO CHANGE WITH STRESS-HYPERDYNAMIC AT BOTH  TIMES  NO CHANGE IN LVEF WITH INCREASED HEART RATE  SEE FULL CHEST WALL DONE JUST PRIOR TO STRESS TEST  Maximum workload of7.00 METs was achieved during exercise.  RESTING HYPERTENSION - EXAGGERATED RESPONSE      (Report version 4.0)Interpreted and Electronically signed  Perform. by: Penni Homans, RCS, RDCS by: Marykay Lex, MD  Resp.Person: Penni Homans, RCS, RDCS On: 11/13/2015 16:21:49  Resp. Staff: Janae Bridgeman, RN       RESTING ECHOCARDIOGRAPHIC DESCRIPTIONS ---------------------------------------  LEFT VENTRICLE Size: Normal Contraction: Normal LV Masses: No Masses LVH: MODERATE LVH  RIGHT VENTRICLE Size: Normal Free Wall: Normal Contraction: Normal RV Masses: No Masses  PERICARDIUM Fluid: No effusion   STRESS ECHOCARDIOGRAPHY ------------------------------------------------------  Protocol: Treadmill Darnell Level) Drugs: None Target Heart Rate: 145 Maximum Predicted Heart  Rate: 171 Resting ECG: Normal  TYPE STAGE TIME HR BP RPE SPO2 COMMENTS -------- ----- --------- --- ------- ----- ---- ------------------------------ Baseline 88 147/ 74 Stress 1 180 sec. 139 144/ 79 15/20 Stress 2 150 sec. 155 208/ 86 17/20 Recovery 1 1 min. 127 211/ 61 Recovery 2 2 min. 110 218/ 66 Recovery 3 4 min. 96 191/ 64 Recovery 4 6 min. 93 176/ 62 Recovery 5 8 min. 96 149/ 75 Recovery 6 10 min. 96 153/ 74  Stress Duration: 5.50 minutes. Max Stress H.R: 155 Target Heart Rate (145) Achieved: Yes Max. workload of 7.00 METs was achieved during exercise. HR Response: Appropriate BP Response: Resting hypertension - exaggerated response  WALL SEGMENT FINDINGS --------------------------------------------------------  Rest Stress ---------------  --------------- Anterior Septum Basal: Normal Normal Mid: Normal Normal Apical Septum: Normal Normal  Anterior Wall Basal: Normal Normal Mid: Normal Normal Apical: Normal Normal  Lateral Wall Basal: Normal Normal Mid: Normal Normal Apical: Normal Normal  Posterior Wall Basal: Normal Normal Mid: Normal Normal  Inferior Wall Basal: Normal Normal Mid: Normal Normal Apical: Normal Normal  Inferior Septum Basal: Normal Normal Mid: Normal Normal  EF: >55% >55%  Rest Low Stress Peak Recovery MR: TRIVIAL MR LVOT Vel:  STRESS ECG RESULTS -----------------------------------------------------------  ECG Results: POSITIVE, 2 mm. ST-SEGMENT DEPRESSION.  INTERPRETATION ---------------------------------------------------------------  ABNORMAL STRESS TEST. NO DOPPLER PERFORMED FOR VALVULAR REGURGITATION NO DOPPLER PERFORMED FOR VALVULAR STENOSIS Note: NORMAL RESTING STUDY, NO CHANGE WITH STRESS-HYPERDYNAMIC AT BOTH TIMES NO CHANGE IN LVEF WITH INCREASED HEART RATE SEE FULL CHEST WALL DONE JUST PRIOR TO STRESS TEST Maximum workload of 7.00 METs was achieved during exercise. RESTING HYPERTENSION - EXAGGERATED RESPONSE   (Report version 4.0) Interpreted and Electronically signed Perform. by: Penni Homans, RCS, RDCS by: Marykay Lex, MD Resp.Person: Penni Homans, RCS, RDCS On: 11/13/2015 16:21:49 Resp. Staff: Janae Bridgeman, RN        -----------------------------------------------------------   HISTORY:Chest pain  REASON: Assess LV function  INDICATION: F16.384 - Encounter for other preprocedural examination.        ECHOCARDIOGRAPHIC DESCRIPTIONS -----------------------------------------------  AORTIC ROOT  Size: Normal  Dissection: INDETERM FOR DISSECTION    AORTIC VALVE  Leaflets: Tricuspid Morphology: Normal  Mobility: Fully Mobile    LEFT VENTRICLEAnterior:  Normal  Size: Normal Lateral: Normal  Contraction: NormalSeptal: Normal  Closest EF: >55%(Estimated)Calc.EF: 57% (3D)Apical: Normal   LV masses: No Masses Inferior: Normal   LVH: MODERATE LVH Posterior: Normal  LV GLS(LOL): -19.5%   LV Note: MID-LV CAVITY GRADIENT  Dias.FxClass: RELAXATION ABNORMALITY (GRADE 1) CORRESPONDS TO E/A REVERSAL    MITRAL VALVE  Leaflets: NormalMobility: Fully mobile  Morphology: ANNULAR CALC    LEFT ATRIUM  Size: MODERATELY ENLARGED   LA masses: No masses  Normal IAS    MAIN PA  Size: Not seen    PULMONIC VALVE  Morphology: Normal  Mobility: Fully Mobile    RIGHT VENTRICLE  Size: NormalFree wall: Normal  Contraction: NormalRV masses: No Masses   TAPSE: 3.2 cm,Normal Range [>= 1.6 cm]    TRICUSPID VALVE  Leaflets: NormalMobility: Fully mobile  Morphology: Normal    RIGHT ATRIUM  Size: Normal RA Other: None   RA masses: No masses    PERICARDIUM   Fluid: No effusion    INFERIOR VENACAVA  Size: Normal Normal respiratory collapse    DOPPLER ECHO and OTHER SPECIAL PROCEDURES ------------------------------------   Aortic: No ARNo AS     Mitral: TRIVIAL MR No MS   MV Inflow E Vel.= nm* cm/sMV  Annulus E'Vel.= nm* cm/sE/E'Ratio= nm*    Tricuspid: TRIVIAL TR No TS   2.7 m/s peak TR vel 33 mmHg peak RV pressure    Pulmonary: TRIVIAL PR No PS    Other:    INTERPRETATION ---------------------------------------------------------------  NORMAL LEFT VENTRICULAR SYSTOLIC  FUNCTION WITH MODERATE LVH  NORMAL LA PRESSURES WITH DIASTOLIC DYSFUNCTION  NORMAL RIGHT VENTRICULAR SYSTOLIC FUNCTION  VALVULAR REGURGITATION: TRIVIAL MR, TRIVIAL PR, TRIVIAL TR  NO VALVULAR STENOSIS  MID-LV CAVITY GRADIENT=2.81mS, 660mG INCREASES TO 4.12M/S, 6665m WITH  VALSALVA MANEUVER  NO PRIOR STUDY FOR COMPARISON      (Report version 3.0)Interpreted and Electronically signed  Perform. by: JeaPenni HomansCS, RDCS by: PamMarykay LexD  Resp.Person: JeaPenni HomansCS, RDCS On: 11/13/2015 16:03:23  Resp. Staff: AshJanae BridgemanN      Nuclear stress test 12/2013: Impression Exercise Capacity:  Lexiscan with low level exercise. BP Response:  Hypertensive blood pressure response. Clinical Symptoms:  No chest pain. ECG Impression:  No significant ST segment change suggestive of ischemia. Comparison with Prior Nuclear Study:No significant change    Overall Impression:  Normal stress nuclear study.   LV Ejection Fraction: 53%.  LV Wall Motion: mild inferior hypokinesis.  Consider echo to further define wall motion and LVEF   PauDorris Carnes2-D echo 11/11/15  Study Conclusions   - Left ventricle: The cavity size was normal. There was severe   concentric hypertrophy. Systolic function was vigorous. The   estimated ejection fraction was in the range of 75% to 80%. There   was dynamic obstruction at restin the outflow tract, with   mid-cavity obliteration, a peak velocity of 278 cm/sec, and a   peak gradient of 31 mm Hg. Wall motion was normal; there were no   regional wall motion abnormalities. Doppler parameters are   consistent with abnormal left ventricular relaxation (grade 1   diastolic dysfunction). - Mitral valve: Moderately calcified annulus. Mildly thickened   leaflets . - Pericardium, extracardiac: A trivial, free-flowing pericardial   effusion was identified circumferential to the  heart. There was   no evidence of hemodynamic compromise.   Impressions:   - Compared to the prior study, there has been no significant   interval change.     ASSESSMENT:    1. Abnormal stress test   2. Preoperative clearance   3. Essential hypertension   4. CKD (chronic kidney disease) stage 5, GFR less than 15 ml/min (HCC)   5. Uncontrolled type 2 diabetes mellitus with hyperglycemia, with long-term current use of insulin ([place] 6. Heart murmur      PLAN:  In order of problems listed above:   Abnormal stress test with 2 mm ST depression. Has had prior normal stress test in 2015 and according to patient normal cardiac catheterization 10 years ago. Patient is here for preoperative clearance [facility] Dr. NisJohnsie Cancelo recommends proceeding with cardiac catheterization. We will contact Dr. PowFlorene Glen arrange dialysis after her cath. I have reviewed the risks, indications, and alternatives to angioplasty and stenting with the patient. Risks include but are not limited to bleeding, infection, vascular injury, stroke, myocardial infection, arrhythmia, kidney injury, radiation-related injury in the case of prolonged fluoroscopy use, emergency cardiac surgery, and death. The patient understands the risks of serious complication is low (<1%<4%nd he agrees to proceed.   [facility]  Her potential donor has to go through testing. She also needs eye surgery this Wednesday but will put it off until after the heart catheterization.  Essential hypertension blood pressure is elevated today. It's usually not this high according to the patient. She was agitated with her driver this morning who was late. She gets dialysis tomorrow. She took her medications in our before she arrived.  CKD on hemodialysis awaiting kidney transplant. Followed by Dr.  Florene Glen.  Uncontrolled diabetes mellitus recent hemoglobin A1c was over 8  Heart murmur recent 2-D echo at [facility] 11/11/15 severe concentric hypertrophy with dynamic obstruction of the out flow tract with mid cavity obliteration. Callosities 278 cm/s peak 31 mmHg.  Recent reaction to dye when checking dialysis catheter. Will premedicate before cath with Prednisone and Benadryl.  Medication Adjustments/Labs and Tests Ordered: Current medicines are reviewed at length with the patient today.  Concerns regarding medicines are outlined above.  Medication changes, Labs and Tests ordered today are listed in the Patient Instructions below. There are no Patient Instructions on file for this visit.   Signed, Ermalinda Barrios, PA-C  12/14/2015 1:32 PM    [facility] [address], [place], [place]  [address] Phone: [phone]; Fax: [phone]

## 2015-12-15 LAB — CBC WITH DIFFERENTIAL/PLATELET
BASOS ABS: 0 {cells}/uL (ref 0–200)
Basophils Relative: 0 %
EOS PCT: 4 %
Eosinophils Absolute: 236 cells/uL (ref 15–500)
HCT: 32 % — ABNORMAL LOW (ref 35.0–45.0)
HEMOGLOBIN: 10.6 g/dL — AB (ref 11.7–15.5)
LYMPHS PCT: 40 %
Lymphs Abs: 2360 cells/uL (ref 850–3900)
MCH: 32.4 pg (ref 27.0–33.0)
MCHC: 33.1 g/dL (ref 32.0–36.0)
MCV: 97.9 fL (ref 80.0–100.0)
MONOS PCT: 5 %
MPV: 11.1 fL (ref 7.5–12.5)
Monocytes Absolute: 295 cells/uL (ref 200–950)
NEUTROS PCT: 51 %
Neutro Abs: 3009 cells/uL (ref 1500–7800)
Platelets: 227 10*3/uL (ref 140–400)
RBC: 3.27 MIL/uL — AB (ref 3.80–5.10)
RDW: 13.7 % (ref 11.0–15.0)
WBC: 5.9 10*3/uL (ref 3.8–10.8)

## 2015-12-15 LAB — BASIC METABOLIC PANEL
BUN: 64 mg/dL — AB (ref 7–25)
CALCIUM: 10.2 mg/dL (ref 8.6–10.2)
CO2: 28 mmol/L (ref 20–31)
CREATININE: 8.72 mg/dL — AB (ref 0.50–1.10)
Chloride: 98 mmol/L (ref 98–110)
GLUCOSE: 218 mg/dL — AB (ref 65–99)
Potassium: 4.7 mmol/L (ref 3.5–5.3)
Sodium: 139 mmol/L (ref 135–146)

## 2015-12-15 LAB — APTT: aPTT: 29 s (ref 22–34)

## 2015-12-15 LAB — PROTIME-INR
INR: 0.9
PROTHROMBIN TIME: 9.7 s (ref 9.0–11.5)

## 2015-12-16 ENCOUNTER — Encounter (HOSPITAL_COMMUNITY)
Admission: RE | Disposition: A | Discharge status: Home / Self Care | Payer: Self-pay | Source: Ambulatory Visit | Attending: Cardiovascular Disease

## 2015-12-16 ENCOUNTER — Ambulatory Visit (HOSPITAL_COMMUNITY)
Admission: RE | Admit: 2015-12-16 | Discharge: 2015-12-16 | Disposition: A | Discharge status: Home / Self Care | Payer: Medicare Other | Source: Ambulatory Visit | Attending: Cardiovascular Disease | Admitting: Cardiovascular Disease

## 2015-12-16 DIAGNOSIS — R0789 Other chest pain: Secondary | ICD-10-CM | POA: Insufficient documentation

## 2015-12-16 DIAGNOSIS — E1122 Type 2 diabetes mellitus with diabetic chronic kidney disease: Secondary | ICD-10-CM | POA: Insufficient documentation

## 2015-12-16 DIAGNOSIS — K219 Gastro-esophageal reflux disease without esophagitis: Secondary | ICD-10-CM | POA: Diagnosis not present

## 2015-12-16 DIAGNOSIS — Z8249 Family history of ischemic heart disease and other diseases of the circulatory system: Secondary | ICD-10-CM | POA: Insufficient documentation

## 2015-12-16 DIAGNOSIS — R9439 Abnormal result of other cardiovascular function study: Secondary | ICD-10-CM | POA: Diagnosis not present

## 2015-12-16 DIAGNOSIS — E1165 Type 2 diabetes mellitus with hyperglycemia: Secondary | ICD-10-CM | POA: Diagnosis not present

## 2015-12-16 DIAGNOSIS — I132 Hypertensive heart and chronic kidney disease with heart failure and with stage 5 chronic kidney disease, or end stage renal disease: Secondary | ICD-10-CM | POA: Diagnosis not present

## 2015-12-16 DIAGNOSIS — R0600 Dyspnea, unspecified: Secondary | ICD-10-CM

## 2015-12-16 DIAGNOSIS — Z794 Long term (current) use of insulin: Secondary | ICD-10-CM | POA: Diagnosis not present

## 2015-12-16 DIAGNOSIS — N186 End stage renal disease: Secondary | ICD-10-CM | POA: Diagnosis not present

## 2015-12-16 DIAGNOSIS — Z79899 Other long term (current) drug therapy: Secondary | ICD-10-CM | POA: Insufficient documentation

## 2015-12-16 DIAGNOSIS — Z7982 Long term (current) use of aspirin: Secondary | ICD-10-CM | POA: Diagnosis not present

## 2015-12-16 DIAGNOSIS — I509 Heart failure, unspecified: Secondary | ICD-10-CM | POA: Insufficient documentation

## 2015-12-16 DIAGNOSIS — Z992 Dependence on renal dialysis: Secondary | ICD-10-CM | POA: Insufficient documentation

## 2015-12-16 HISTORY — PX: CARDIAC CATHETERIZATION: SHX172

## 2015-12-16 LAB — GLUCOSE, CAPILLARY
GLUCOSE-CAPILLARY: 330 mg/dL — AB (ref 65–99)
Glucose-Capillary: 428 mg/dL — ABNORMAL HIGH (ref 65–99)

## 2015-12-16 SURGERY — LEFT HEART CATH AND CORONARY ANGIOGRAPHY

## 2015-12-16 MED ORDER — HEPARIN (PORCINE) IN NACL 2-0.9 UNIT/ML-% IJ SOLN
INTRAMUSCULAR | Status: AC
  Filled 2015-12-16: qty 500

## 2015-12-16 MED ORDER — FAMOTIDINE 20 MG PO TABS
ORAL_TABLET | ORAL | Status: AC
  Administered 2015-12-16: 20 mg via ORAL
  Filled 2015-12-16: qty 1

## 2015-12-16 MED ORDER — SODIUM CHLORIDE 0.9 % IV SOLN: 250.0000 mL | INTRAVENOUS | Status: DC | PRN

## 2015-12-16 MED ORDER — HYDRALAZINE HCL 20 MG/ML IJ SOLN
5.0000 mg | Freq: Once | INTRAMUSCULAR | Status: AC
  Administered 2015-12-16: 5 mg via INTRAVENOUS

## 2015-12-16 MED ORDER — FAMOTIDINE 20 MG PO TABS
20.0000 mg | ORAL_TABLET | ORAL | Status: AC
  Administered 2015-12-16: 20 mg via ORAL

## 2015-12-16 MED ORDER — DIPHENHYDRAMINE HCL 50 MG/ML IJ SOLN
25.0000 mg | INTRAMUSCULAR | Status: AC
  Administered 2015-12-16: 25 mg via INTRAVENOUS

## 2015-12-16 MED ORDER — IOPAMIDOL (ISOVUE-370) INJECTION 76%
INTRAVENOUS | Status: DC | PRN
Start: 2015-12-16 — End: 2015-12-16
  Administered 2015-12-16: 40 mL via INTRA_ARTERIAL

## 2015-12-16 MED ORDER — ASPIRIN 81 MG PO CHEW
CHEWABLE_TABLET | ORAL | Status: AC
  Administered 2015-12-16: 81 mg via ORAL
  Filled 2015-12-16: qty 1

## 2015-12-16 MED ORDER — LIDOCAINE HCL (PF) 1 % IJ SOLN
INTRAMUSCULAR | Status: DC | PRN
  Administered 2015-12-16: 20 mL via SUBCUTANEOUS

## 2015-12-16 MED ORDER — ASPIRIN 81 MG PO CHEW
81.0000 mg | CHEWABLE_TABLET | ORAL | Status: AC
  Administered 2015-12-16: 81 mg via ORAL

## 2015-12-16 MED ORDER — LIDOCAINE HCL (CARDIAC) 20 MG/ML IV SOLN
INTRAVENOUS | Status: AC
  Filled 2015-12-16: qty 5

## 2015-12-16 MED ORDER — MIDAZOLAM HCL 2 MG/2ML IJ SOLN
INTRAMUSCULAR | Status: DC | PRN
  Administered 2015-12-16: 2 mg via INTRAVENOUS

## 2015-12-16 MED ORDER — HYDRALAZINE HCL 20 MG/ML IJ SOLN
INTRAMUSCULAR | Status: AC
  Filled 2015-12-16: qty 1

## 2015-12-16 MED ORDER — ONDANSETRON HCL 4 MG/2ML IJ SOLN
4.0000 mg | Freq: Four times a day (QID) | INTRAMUSCULAR | Status: DC | PRN
Start: 2015-12-16 — End: 2015-12-16

## 2015-12-16 MED ORDER — PREDNISONE 20 MG PO TABS: 60.0000 mg | ORAL_TABLET | ORAL | Status: DC

## 2015-12-16 MED ORDER — SODIUM CHLORIDE 0.9% FLUSH: 3.0000 mL | Freq: Two times a day (BID) | INTRAVENOUS | Status: DC

## 2015-12-16 MED ORDER — PREDNISONE 20 MG PO TABS
60.0000 mg | ORAL_TABLET | ORAL | Status: AC
  Administered 2015-12-16: 60 mg via ORAL

## 2015-12-16 MED ORDER — SODIUM CHLORIDE 0.9 % IV SOLN
INTRAVENOUS | Status: DC
  Administered 2015-12-16: 12:00:00 via INTRAVENOUS

## 2015-12-16 MED ORDER — FENTANYL CITRATE (PF) 100 MCG/2ML IJ SOLN
INTRAMUSCULAR | Status: DC | PRN
Start: 2015-12-16 — End: 2015-12-16
  Administered 2015-12-16: 50 ug via INTRAVENOUS

## 2015-12-16 MED ORDER — ACETAMINOPHEN 325 MG PO TABS: 650.0000 mg | ORAL_TABLET | ORAL | Status: DC | PRN

## 2015-12-16 MED ORDER — IOPAMIDOL (ISOVUE-370) INJECTION 76%
INTRAVENOUS | Status: AC
  Filled 2015-12-16: qty 100

## 2015-12-16 MED ORDER — HEPARIN (PORCINE) IN NACL 2-0.9 UNIT/ML-% IJ SOLN
INTRAMUSCULAR | Status: DC | PRN
  Administered 2015-12-16: 1000 mL

## 2015-12-16 MED ORDER — SODIUM CHLORIDE 0.9% FLUSH: 3.0000 mL | INTRAVENOUS | Status: DC | PRN

## 2015-12-16 MED ORDER — MIDAZOLAM HCL 2 MG/2ML IJ SOLN
INTRAMUSCULAR | Status: AC
  Filled 2015-12-16: qty 2

## 2015-12-16 MED ORDER — INSULIN ASPART 100 UNIT/ML ~~LOC~~ SOLN
SUBCUTANEOUS | Status: AC
  Administered 2015-12-16: 10 [IU] via SUBCUTANEOUS
  Filled 2015-12-16: qty 1

## 2015-12-16 MED ORDER — LIDOCAINE HCL (PF) 1 % IJ SOLN
INTRAMUSCULAR | Status: AC
  Filled 2015-12-16: qty 30

## 2015-12-16 MED ORDER — INSULIN ASPART 100 UNIT/ML ~~LOC~~ SOLN
10.0000 [IU] | Freq: Once | SUBCUTANEOUS | Status: AC
  Administered 2015-12-16: 10 [IU] via SUBCUTANEOUS

## 2015-12-16 MED ORDER — FENTANYL CITRATE (PF) 100 MCG/2ML IJ SOLN
INTRAMUSCULAR | Status: AC
  Filled 2015-12-16: qty 2

## 2015-12-16 SURGICAL SUPPLY — 9 items
CATH INFINITI 5FR MULTPACK ANG (CATHETERS) ×2 IMPLANT
KIT HEART LEFT (KITS) ×2 IMPLANT
PACK CARDIAC CATHETERIZATION (CUSTOM PROCEDURE TRAY) ×2 IMPLANT
SHEATH PINNACLE 5F 10CM (SHEATH) ×2 IMPLANT
SHEATH PINNACLE 7F 10CM (SHEATH) IMPLANT
SYR MEDRAD MARK V 150ML (SYRINGE) ×2 IMPLANT
TRANSDUCER W/STOPCOCK (MISCELLANEOUS) ×2 IMPLANT
TUBING CIL FLEX 10 FLL-RA (TUBING) ×2 IMPLANT
WIRE EMERALD 3MM-J .035X150CM (WIRE) ×2 IMPLANT

## 2015-12-16 NOTE — H&P (View-Only) (Signed)
Cardiology Office Note    Date:  12/14/2015   ID:  Jade Bond, DOB 06-19-1965, MRN [id number]  PCP:  Dorena Dew, FNP  Cardiologist: Dr. Harrington Challenger  Chief Complaint  Patient presents with  . Pre-op Exam    History of Present Illness:  Jade Bond is a 50 y.o. female who Dr. Harrington Challenger saw in 2015 for lower extremity edema and chest pain and dyspnea on exertion. She had a normal nuclear stress test in 2013 that was normal and says she had a heart catheterization about 10 yrs ago in [place] without significant findings but we had no records. She had moved from [place]. She also has history of uncontrolled DM, HTN, HLD, CKD stage III, normocytic anemia (heavy menses). 2-D echo 12/2013 showed normal LV EF 60-65% with grade 1 DD. Nuclear stress test 11/6 less 15 was normal.  Patient now has end-stage renal disease on hemodialysis and is awaiting kidney transplant. She had an abnormal stress echo at [facility] with 2 mm ST depression. Echo was normal.  One month ago after dialysis she felt like she had too much fluid taken off. She developed chest tightness when leaving and went to [facility] ER where w/u was negative. 2-D echo was done and showed severe concentric hypertrophy LVEF 75-80% with dynamic obstruction at the outflow track grade 1 DD. It was unchanged from prior echoes. Raises her 44 yr old grandson so very active with him. Denies exertional chest tightness, pressure, dyspnea, dyspnea on exertion, dizziness or presyncope. His scheduled for eye surgery this Wednesday but needs clearance first. Mother  With heart disease-doesn't know specifics. Never smoked. BP high today but agditated with her ride today.     Past Medical History:  Diagnosis Date  . Chest pain    a. 2D echo 01/2010 - mild TR, LVEF 60% with normal wall thickness, trace MR, mild TR, normal RV, trace pericardial effusion. b. Normal nuc 03/2011 at [facility].  . CHF (congestive heart failure) ([place])   . CKD (chronic kidney disease), stage III     . Diabetes mellitus   . Diabetic retinopathy ([place])   . GERD (gastroesophageal reflux disease)   . Heart murmur    as a child  . Hyperlipidemia   . Hypertension   . Hypoalbuminemia   . Normocytic anemia   . Pneumonia 07/04/2013    Past Surgical History:  Procedure Laterality Date  . BASCILIC VEIN TRANSPOSITION Left 03/04/2015   Procedure: BASILIC VEIN TRANSPOSITION;  Surgeon: Elam Dutch, MD;  Location: [place];  Service: Vascular;  Laterality: Left;  . BREAST SURGERY     lumpectomy  . CARDIAC CATHETERIZATION  ~ 2008   negative, no intervention needed  . DILATION AND CURETTAGE OF UTERUS     x 3  . EYE SURGERY     eye injections  . EYE SURGERY     right eye surgery   . TONSILLECTOMY  1980  . TUBAL LIGATION  1995    Current Medications: Outpatient Medications Prior to Visit  Medication Sig Dispense Refill  . aspirin EC 81 MG tablet Take 81 mg by mouth daily.    . Blood Glucose Monitoring Suppl (TRUE METRIX METER) w/Device KIT Check blood sugar twice a day in AM and 2 hours after dinner. 1 kit 0  . calcium acetate (PHOSLO) 667 MG capsule Take 1,334 mg by mouth 3 (three) times daily with meals.    . furosemide (LASIX) 40 MG tablet Take 40 mg by mouth daily.     Marland Kitchen  glipiZIDE (GLUCOTROL) 10 MG tablet Take 10 mg by mouth 2 (two) times daily before a meal.    . glucose blood (TRUE METRIX BLOOD GLUCOSE TEST) test strip Use as instructed 100 each 12  . metoprolol succinate (TOPROL-XL) 100 MG 24 hr tablet Take 2 tablets (200 mg total) by mouth daily. 60 tablet 3  . multivitamin (RENA-VIT) TABS tablet Take 1 tablet by mouth daily.    . nitroGLYCERIN (NITROSTAT) 0.4 MG SL tablet Place 0.4 mg under the tongue every 5 (five) minutes as needed for chest pain.    Marland Kitchen zolpidem (AMBIEN) 5 MG tablet Take 1 tablet (5 mg total) by mouth at bedtime as needed for sleep. 30 tablet 1  . Insulin Glargine (LANTUS SOLOSTAR) 100 UNIT/ML Solostar Pen Inject into skin at 10 pm. Increase by one unit a night  until fasting blood sugar under 120. Then stay at that dosage. (Patient not taking: Reported on 12/14/2015) 5 pen PRN  . megestrol (MEGACE) 40 MG tablet Take 40 mg by mouth daily.     No facility-administered medications prior to visit.      Allergies:   Other   Social History   Social History  . Marital status: Legally Separated    Spouse name: N/A  . Number of children: N/A  . 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 Smoking status: Never Smoker  . Smokeless tobacco: Never Used  . Alcohol use Yes     Comment: occasional drink of wine - 1 glass/week or less  . Drug use: No  . Sexual activity: Not Asked   Other Topics Concern  . None   Social History Narrative  . None     Family History:  The patient's family history includes Diabetes Mellitus II in her mother and sister; Heart attack in her father; Hypertension in her sister; Kidney disease in her father; Malignant hyperthermia in her mother.   ROS:   Please see the history of present illness.    Review of Systems  Constitution: Negative.  HENT: Negative.   Eyes: Positive for vision loss in right eye and visual disturbance.  Cardiovascular: Negative.   Respiratory: Negative.   Hematologic/Lymphatic: Negative.   Musculoskeletal: Negative.  Negative for joint pain.  Gastrointestinal: Negative.   Genitourinary: Negative.   Neurological: Negative.    All other systems reviewed and are negative.   PHYSICAL EXAM:   VS:  BP (!) 170/102   Pulse 79   Ht _0  (1.702 m)   Wt 180 lb 12.8 oz (82 kg)   LMP 10/22/2015 Comment: patient has been bleeding since  BMI 28.32 kg/m   Physical Exam  GEN: Well nourished, well developed, in no acute distress  Neck: no JVD, carotid bruits, or masses Cardiac:RRR; 3/6 systolic murmurs LSB, no rubs, or gallops  Respiratory:  clear to auscultation bilaterally, normal work of breathing GI: soft, nontender,  nondistended, + BS Ext: without cyanosis, clubbing, or edema, Good distal pulses bilaterally MS: no deformity or atrophy  Skin: warm and dry, no rash Psych: euthymic mood, full affect  Wt Readings from Last 3 Encounters:  12/14/15 180 lb 12.8 oz (82 kg)  12/11/15 171 lb 9.6 oz (77.8 kg)  11/10/15 168 lb 12.8 oz (76.6 kg)      Studies/Labs Reviewed:   EKG:  EKG is not  ordered today.  The ekg reviewed from 11/11/15 demonstrates normal  sinus rhythm with LVH, no acute change.  Recent Labs: 11/10/2015: ALT 12; BUN 25; Creatinine, Ser 4.77; Potassium 3.0; Sodium 132 12/02/2015: Hemoglobin 11.3; Platelets 187   Lipid Panel    Component Value Date/Time   CHOL 187 10/23/2015 1357   TRIG 136 10/23/2015 1357   HDL 35 (L) 10/23/2015 1357   CHOLHDL 5.3 (H) 10/23/2015 1357   VLDL 27 10/23/2015 1357   LDLCALC 125 10/23/2015 1357   LDLDIRECT 236.6 11/29/2013 1529    Additional studies/ records that were reviewed today include:  Echo stress test at [facility] 11/13/15 LVEF 55% patient exercised 5 minutes 50 seconds and had 2 mm ST segment depression. There is normal resting study and no change with stress hyperdynamic at both times. No change in LVEF with increased heart rate. Hydrated hypertensive response. STRESS ECHOCARDIOGRAPHY ------------------------------------------------------     Protocol: Treadmill Darnell Level)  Drugs: None  Target Heart Rate: 145Maximum Predicted Heart Rate: 171  Resting ECG: Normal    TYPE STAGETIMEHRBPRPESPO2 COMMENTS  -------- ----- --------- --- ------- ----- ---- ------------------------------  Laurence Spates 147/ 74  Stress 1180 sec. 139 144/ 79 15/20  Stress 2150 sec. 155 208/ 86 17/20  Recovery 11 min. 127 211/ 61  Recovery 22 min. 110 218/ 66  Recovery 34 min.96 191/ 64  Recovery 46 min.93 176/ 62  Recovery 58 min.96  149/ 75  Recovery 6 10 min.96 153/ 74    Stress Duration: 5.50 minutes.Max Stress H.R: 155  Target Heart Rate (145) Achieved: Yes  Max. workload of7.00METs was achieved during exercise.  HR Response: Appropriate  BP Response: Resting hypertension - exaggerated response    WALL SEGMENT FINDINGS --------------------------------------------------------     RestStress   --------------- ---------------  Anterior Septum Basal: NormalNormal  Mid: NormalNormal  Apical Septum: NormalNormal    Anterior Wall Basal: NormalNormal  Mid: NormalNormal   Apical: NormalNormal     Lateral Wall Basal: NormalNormal  Mid: NormalNormal   Apical: NormalNormal    Posterior Wall Basal: NormalNormal  Mid: NormalNormal    Inferior Wall Basal: NormalNormal  Mid: NormalNormal   Apical: NormalNormal    Inferior Septum Basal: NormalNormal  Mid: NormalNormal     EF: >55%>55%    RestLow StressPeakRecovery  MR: TRIVIAL MR  LVOT Vel:    ADDITIONAL FINDINGS ----------------------------------------------------------    Chest Discomfort: None  Arrhythmia: None  Termination Reason: Fatigue   Complications: None    STRESS ECG RESULTS -----------------------------------------------------------    ECG Results: POSITIVE, 2 mm. ST-SEGMENT DEPRESSION.    INTERPRETATION ---------------------------------------------------------------    ABNORMAL STRESS TEST.  NO  DOPPLER PERFORMED FOR VALVULAR REGURGITATION  NO DOPPLER PERFORMED FOR VALVULAR STENOSIS  Note: NORMAL RESTING STUDY, NO CHANGE WITH STRESS-HYPERDYNAMIC AT BOTH  TIMES  NO CHANGE IN LVEF WITH INCREASED HEART RATE  SEE FULL CHEST WALL DONE JUST PRIOR TO STRESS TEST  Maximum workload of7.00 METs was achieved during exercise.  RESTING HYPERTENSION - EXAGGERATED RESPONSE      (Report version 4.0)Interpreted and Electronically signed  Perform. by: Penni Homans, RCS, RDCS by: Marykay Lex, MD  Resp.Person: Penni Homans, RCS, RDCS On: 11/13/2015 16:21:49  Resp. Staff: Janae Bridgeman, RN       RESTING ECHOCARDIOGRAPHIC DESCRIPTIONS ---------------------------------------  LEFT VENTRICLE Size: Normal Contraction: Normal LV Masses: No Masses LVH: MODERATE LVH  RIGHT VENTRICLE Size: Normal Free Wall: Normal Contraction: Normal RV Masses: No Masses  PERICARDIUM Fluid: No effusion   STRESS ECHOCARDIOGRAPHY ------------------------------------------------------  Protocol: Treadmill Darnell Level) Drugs: None Target Heart Rate: 145 Maximum Predicted Heart  Rate: 171 Resting ECG: Normal  TYPE STAGE TIME HR BP RPE SPO2 COMMENTS -------- ----- --------- --- ------- ----- ---- ------------------------------ Baseline 88 147/ 74 Stress 1 180 sec. 139 144/ 79 15/20 Stress 2 150 sec. 155 208/ 86 17/20 Recovery 1 1 min. 127 211/ 61 Recovery 2 2 min. 110 218/ 66 Recovery 3 4 min. 96 191/ 64 Recovery 4 6 min. 93 176/ 62 Recovery 5 8 min. 96 149/ 75 Recovery 6 10 min. 96 153/ 74  Stress Duration: 5.50 minutes. Max Stress H.R: 155 Target Heart Rate (145) Achieved: Yes Max. workload of 7.00 METs was achieved during exercise. HR Response: Appropriate BP Response: Resting hypertension - exaggerated response  WALL SEGMENT FINDINGS --------------------------------------------------------  Rest Stress ---------------  --------------- Anterior Septum Basal: Normal Normal Mid: Normal Normal Apical Septum: Normal Normal  Anterior Wall Basal: Normal Normal Mid: Normal Normal Apical: Normal Normal  Lateral Wall Basal: Normal Normal Mid: Normal Normal Apical: Normal Normal  Posterior Wall Basal: Normal Normal Mid: Normal Normal  Inferior Wall Basal: Normal Normal Mid: Normal Normal Apical: Normal Normal  Inferior Septum Basal: Normal Normal Mid: Normal Normal  EF: >55% >55%  Rest Low Stress Peak Recovery MR: TRIVIAL MR LVOT Vel:  STRESS ECG RESULTS -----------------------------------------------------------  ECG Results: POSITIVE, 2 mm. ST-SEGMENT DEPRESSION.  INTERPRETATION ---------------------------------------------------------------  ABNORMAL STRESS TEST. NO DOPPLER PERFORMED FOR VALVULAR REGURGITATION NO DOPPLER PERFORMED FOR VALVULAR STENOSIS Note: NORMAL RESTING STUDY, NO CHANGE WITH STRESS-HYPERDYNAMIC AT BOTH TIMES NO CHANGE IN LVEF WITH INCREASED HEART RATE SEE FULL CHEST WALL DONE JUST PRIOR TO STRESS TEST Maximum workload of 7.00 METs was achieved during exercise. RESTING HYPERTENSION - EXAGGERATED RESPONSE   (Report version 4.0) Interpreted and Electronically signed Perform. by: Penni Homans, RCS, RDCS by: Marykay Lex, MD Resp.Person: Penni Homans, RCS, RDCS On: 11/13/2015 16:21:49 Resp. Staff: Janae Bridgeman, RN        -----------------------------------------------------------   HISTORY:Chest pain  REASON: Assess LV function  INDICATION: F16.384 - Encounter for other preprocedural examination.        ECHOCARDIOGRAPHIC DESCRIPTIONS -----------------------------------------------  AORTIC ROOT  Size: Normal  Dissection: INDETERM FOR DISSECTION    AORTIC VALVE  Leaflets: Tricuspid Morphology: Normal  Mobility: Fully Mobile    LEFT VENTRICLEAnterior:  Normal  Size: Normal Lateral: Normal  Contraction: NormalSeptal: Normal  Closest EF: >55%(Estimated)Calc.EF: 57% (3D)Apical: Normal   LV masses: No Masses Inferior: Normal   LVH: MODERATE LVH Posterior: Normal  LV GLS(LOL): -19.5%   LV Note: MID-LV CAVITY GRADIENT  Dias.FxClass: RELAXATION ABNORMALITY (GRADE 1) CORRESPONDS TO E/A REVERSAL    MITRAL VALVE  Leaflets: NormalMobility: Fully mobile  Morphology: ANNULAR CALC    LEFT ATRIUM  Size: MODERATELY ENLARGED   LA masses: No masses  Normal IAS    MAIN PA  Size: Not seen    PULMONIC VALVE  Morphology: Normal  Mobility: Fully Mobile    RIGHT VENTRICLE  Size: NormalFree wall: Normal  Contraction: NormalRV masses: No Masses   TAPSE: 3.2 cm,Normal Range [>= 1.6 cm]    TRICUSPID VALVE  Leaflets: NormalMobility: Fully mobile  Morphology: Normal    RIGHT ATRIUM  Size: Normal RA Other: None   RA masses: No masses    PERICARDIUM   Fluid: No effusion    INFERIOR VENACAVA  Size: Normal Normal respiratory collapse    DOPPLER ECHO and OTHER SPECIAL PROCEDURES ------------------------------------   Aortic: No ARNo AS     Mitral: TRIVIAL MR No MS   MV Inflow E Vel.= nm* cm/sMV  Annulus E'Vel.= nm* cm/sE/E'Ratio= nm*    Tricuspid: TRIVIAL TR No TS   2.7 m/s peak TR vel 33 mmHg peak RV pressure    Pulmonary: TRIVIAL PR No PS    Other:    INTERPRETATION ---------------------------------------------------------------  NORMAL LEFT VENTRICULAR SYSTOLIC  FUNCTION WITH MODERATE LVH  NORMAL LA PRESSURES WITH DIASTOLIC DYSFUNCTION  NORMAL RIGHT VENTRICULAR SYSTOLIC FUNCTION  VALVULAR REGURGITATION: TRIVIAL MR, TRIVIAL PR, TRIVIAL TR  NO VALVULAR STENOSIS  MID-LV CAVITY GRADIENT=2.9m/S, 62mmHG INCREASES TO 4.1M/S, 66mmHG WITH  VALSALVA MANEUVER  NO PRIOR STUDY FOR COMPARISON      (Report version 3.0)Interpreted and Electronically signed  Perform. by: Jean Woolard, RCS, RDCS by: Pamela Susan Douglas, MD  Resp.Person: Jean Woolard, RCS, RDCS On: 11/13/2015 16:03:23  Resp. Staff: Ashley B Newsome, RN      Nuclear stress test 12/2013: Impression Exercise Capacity:  Lexiscan with low level exercise. BP Response:  Hypertensive blood pressure response. Clinical Symptoms:  No chest pain. ECG Impression:  No significant ST segment change suggestive of ischemia. Comparison with Prior Nuclear Study:No significant change    Overall Impression:  Normal stress nuclear study.   LV Ejection Fraction: 53%.  LV Wall Motion: mild inferior hypokinesis.  Consider echo to further define wall motion and LVEF   Paula Ross   2-D echo 11/11/15  Study Conclusions   - Left ventricle: The cavity size was normal. There was severe   concentric hypertrophy. Systolic function was vigorous. The   estimated ejection fraction was in the range of 75% to 80%. There   was dynamic obstruction at restin the outflow tract, with   mid-cavity obliteration, a peak velocity of 278 cm/sec, and a   peak gradient of 31 mm Hg. Wall motion was normal; there were no   regional wall motion abnormalities. Doppler parameters are   consistent with abnormal left ventricular relaxation (grade 1   diastolic dysfunction). - Mitral valve: Moderately calcified annulus. Mildly thickened   leaflets . - Pericardium, extracardiac: A trivial, free-flowing pericardial   effusion was identified circumferential to the  heart. There was   no evidence of hemodynamic compromise.   Impressions:   - Compared to the prior study, there has been no significant   interval change.     ASSESSMENT:    1. Abnormal stress test   2. Preoperative clearance   3. Essential hypertension   4. CKD (chronic kidney disease) stage 5, GFR less than 15 ml/min (HCC)   5. Uncontrolled type 2 diabetes mellitus with hyperglycemia, with long-term current use of insulin (HCC)   6. Heart murmur      PLAN:  In order of problems listed above:   Abnormal stress test with 2 mm ST depression. Has had prior normal stress test in 2015 and according to patient normal cardiac catheterization 10 years ago. Patient is here for preoperative clearance before undergoing kidney transplant at [facility]. Discussed patient in detail with Dr. Nishan who recommends proceeding with cardiac catheterization. We will contact Dr. Powell to arrange dialysis after her cath. I have reviewed the risks, indications, and alternatives to angioplasty and stenting with the patient. Risks include but are not limited to bleeding, infection, vascular injury, stroke, myocardial infection, arrhythmia, kidney injury, radiation-related injury in the case of prolonged fluoroscopy use, emergency cardiac surgery, and death. The patient understands the risks of serious complication is low (<1%) and he agrees to proceed.   Preoperative clearance before undergoing kidney transplant at [facility]. This is not scheduled as of yet.   Her potential donor has to go through testing. She also needs eye surgery this Wednesday but will put it off until after the heart catheterization.  Essential hypertension blood pressure is elevated today. It's usually not this high according to the patient. She was agitated with her driver this morning who was late. She gets dialysis tomorrow. She took her medications in our before she arrived.  CKD on hemodialysis awaiting kidney transplant. Followed by Dr.  Florene Glen.  Uncontrolled diabetes mellitus recent hemoglobin A1c was over 8  Heart murmur recent 2-D echo at [facility] 11/11/15 severe concentric hypertrophy with dynamic obstruction of the out flow tract with mid cavity obliteration. Callosities 278 cm/s peak 31 mmHg.  Recent reaction to dye when checking dialysis catheter. Will premedicate before cath with Prednisone and Benadryl.  Medication Adjustments/Labs and Tests Ordered: Current medicines are reviewed at length with the patient today.  Concerns regarding medicines are outlined above.  Medication changes, Labs and Tests ordered today are listed in the Patient Instructions below. There are no Patient Instructions on file for this visit.   Signed, Ermalinda Barrios, PA-C  12/14/2015 1:32 PM    [facility] [address], [place], [place]  [address] Phone: [phone]; Fax: [phone]

## 2015-12-16 NOTE — Discharge Instructions (Signed)

## 2015-12-16 NOTE — Research (Signed)
CADLAD Informed Consent   Subject Name: Jade Bond  Subject met inclusion and exclusion criteria.  The informed consent form, study requirements and expectations were reviewed with the subject and questions and concerns were addressed prior to the signing of the consent form.  The subject verbalized understanding of the trial requirements.  The subject agreed to participate in the  trial and signed the informed consent.  The informed consent was obtained prior to performance of any protocol-specific procedures for the subject.  A copy of the signed informed consent was given to the subject and a copy was placed in the subject's medical record.  [name] 12/16/2015, 11:06 AM

## 2015-12-16 NOTE — Interval H&P Note (Signed)
Cath Lab Visit (complete for each Cath Lab visit)  Clinical Evaluation Leading to the Procedure:   ACS: No.  Non-ACS:    Anginal Classification: CCS II  Anti-ischemic medical therapy: Minimal Therapy (1 class of medications)  Non-Invasive Test Results: Intermediate-risk stress test findings: cardiac mortality 1-3%/year  Prior CABG: No previous CABG      History and Physical Interval Note:  12/16/2015 2:13 PM  Jade Bond  has presented today for surgery, with the diagnosis of abnormal stress test  The various methods of treatment have been discussed with the patient and family. After consideration of risks, benefits and other options for treatment, the patient has consented to  Procedure(s): Left Heart Cath and Coronary Angiography (N/A) as a surgical intervention .  The patient's history has been reviewed, patient examined, no change in status, stable for surgery.  I have reviewed the patient's chart and labs.  Questions were answered to the patient's satisfaction.     Shelva Majestic

## 2015-12-16 NOTE — Progress Notes (Addendum)
Site area: RFA Site Prior to Removal:  Level 0 Pressure Applied For:30 min Manual:  yes  Patient Status During Pull: stable  Post Pull Site:  Level Post Pull Instructions Given:  yes Post Pull Pulses Present: palpable Dressing Applied:  tegaderm Bedrest begins @ 1600 till 2000 Comments: canderson/Cherry Ronnald Ramp

## 2015-12-17 ENCOUNTER — Encounter: Payer: Self-pay | Admitting: Physician Assistant

## 2015-12-17 ENCOUNTER — Telehealth: Payer: Self-pay | Admitting: Internal Medicine

## 2015-12-17 ENCOUNTER — Encounter (HOSPITAL_COMMUNITY): Payer: Self-pay | Admitting: Cardiovascular Disease

## 2015-12-17 MED FILL — Lidocaine HCl Local Preservative Free (PF) Inj 1%: INTRAMUSCULAR | Qty: 30 | Status: AC

## 2015-12-17 NOTE — Telephone Encounter (Signed)
Reviewed with Dr. Harrington Challenger, "ok to proceed with eye surgery, cath with minimal CAD hyperdynamic LV"  Faxed to [place] at Dr. Dahlia Bailiff office [phone].  When I called her to inform the fax was sent, she asked for last OV note, which I have faxed as well.

## 2015-12-17 NOTE — Telephone Encounter (Signed)
New message   Need something in written today   Request for surgical clearance:  1. What type of surgery is being performed? Eye surgery   2. When is this surgery scheduled? 11.17.2017 @ 7 am   3. Are there any medications that need to be held prior to surgery and how long? Please advise   4. Name of physician performing surgery? Dr. Zadie Rhine  5. What is your office phone and fax number? [phone] /  fax [phone]

## 2015-12-17 NOTE — Telephone Encounter (Signed)
Follow Up:    Crystal calling again,they need this clearance asap,surgery is scheduled for tomorrow. Please fax to [phone].

## 2015-12-23 ENCOUNTER — Encounter: Payer: Self-pay | Admitting: Physician Assistant

## 2015-12-23 NOTE — Telephone Encounter (Signed)
ERROR

## 2015-12-28 ENCOUNTER — Ambulatory Visit: Payer: Medicare Other | Admitting: Physician Assistant

## 2015-12-29 ENCOUNTER — Telehealth: Payer: Self-pay | Admitting: *Deleted

## 2015-12-29 NOTE — Telephone Encounter (Signed)
Shaianne called 12/28/15 11:54 and left a message stating she was here in October and the doctor wrote her a prescription- states she needs to get it filled, but doesn't remember doctor name. Per chart review she does have an upcoming appt with Dr. Harolyn Rutherford 01/01/16.

## 2015-12-29 NOTE — Telephone Encounter (Signed)
I called Jade Bond and she states when she didn't hear back she called her old doctor and they refilled the medicine so she is fine now. I reviewed her upcoming appointment with her.

## 2015-12-30 MED FILL — ZOLPIDEM TARTRATE 5 MG TAB: 5 | 30 days supply | Qty: 30 | Fill #0

## 2016-01-01 ENCOUNTER — Ambulatory Visit (INDEPENDENT_AMBULATORY_CARE_PROVIDER_SITE_OTHER): Payer: Medicare Other | Admitting: Obstetrics & Gynecology

## 2016-01-01 ENCOUNTER — Encounter: Payer: Self-pay | Admitting: Obstetrics & Gynecology

## 2016-01-01 VITALS — BP 129/74 | HR 96 | Ht 67.0 in | Wt 174.0 lb

## 2016-01-01 DIAGNOSIS — N939 Abnormal uterine and vaginal bleeding, unspecified: Secondary | ICD-10-CM

## 2016-01-01 DIAGNOSIS — R102 Pelvic and perineal pain: Secondary | ICD-10-CM | POA: Diagnosis not present

## 2016-01-01 MED ORDER — MEGESTROL ACETATE 40 MG PO TABS
80.0000 mg | ORAL_TABLET | Freq: Two times a day (BID) | ORAL | 12 refills | Status: DC
Start: 2016-01-01 — End: 2017-01-12

## 2016-01-01 MED ORDER — TRAMADOL HCL 50 MG PO TABS: 50.0000 mg | ORAL_TABLET | Freq: Four times a day (QID) | ORAL | 0 refills | Status: AC | PRN

## 2016-01-01 NOTE — Progress Notes (Signed)
GYNECOLOGY VISIT NOTE  History:  50 y.o. [id number] here today for report of continued AUB; ran out of Megace. Had negative pap and endometrial biopsy in 07/2015; pelvic scan showed small uterus with 3 cm fibroid and smaller fibroids. Reports pain due to fibroids, wants surgery due to fibroids. She denies any abnormal vaginal discharge or other concerns.   Past Medical History:  Diagnosis Date  . Chest pain    a. 2D echo 01/2010 - mild TR, LVEF 60% with normal wall thickness, trace MR, mild TR, normal RV, trace pericardial effusion. b. Normal nuc 03/2011 at [facility].  . CHF (congestive heart failure) ([place])   . CKD (chronic kidney disease), stage III   . Diabetes mellitus   . Diabetic retinopathy ([place])   . GERD (gastroesophageal reflux disease)   . Heart murmur    as a child  . Hyperlipidemia   . Hypertension   . Hypoalbuminemia   . Normocytic anemia   . Pneumonia 07/04/2013    Past Surgical History:  Procedure Laterality Date  . BASCILIC VEIN TRANSPOSITION Left 03/04/2015   Procedure: BASILIC VEIN TRANSPOSITION;  Surgeon: Elam Dutch, MD;  Location: [facility];  Service: Vascular;  Laterality: Left;  . BREAST SURGERY     lumpectomy  . CARDIAC CATHETERIZATION  ~ 2008   negative, no intervention needed  . CARDIAC CATHETERIZATION N/A 12/16/2015   Procedure: Left Heart Cath and Coronary Angiography;  Surgeon: Troy Sine, MD;  Location: [facility];  Service: Cardiovascular;  Laterality: N/A;  . DILATION AND CURETTAGE OF UTERUS     x 3  . EYE SURGERY     eye injections  . EYE SURGERY     right eye surgery   . TONSILLECTOMY  1980  . TUBAL LIGATION  1995    The following portions of the patient's history were reviewed and updated as appropriate: allergies, current medications, past family history, past medical history, past social history, past surgical history and problem list.   Health Maintenance:  Normal pap and negative HRHPV on 07/17/15.  Normal mammogram on 09/30/15.    Review of Systems:  Pertinent items noted in HPI and remainder of comprehensive ROS otherwise negative.   Objective:  Physical Exam BP 129/74   Pulse 96   Ht 5\' 7"  (1.702 m)   Wt 174 lb (78.9 kg)   LMP 10/22/2015 (Exact Date) Comment: pt had a tubal in 1992  BMI 27.25 kg/m  CONSTITUTIONAL: Well-developed, well-nourished female in no acute distress.  HENT:  Normocephalic, atraumatic. External right and left ear normal. Oropharynx is clear and moist EYES: Conjunctivae and EOM are normal. Pupils are equal, round, and reactive to light. No scleral icterus.  NECK: Normal range of motion, supple, no masses SKIN: Skin is warm and dry. No rash noted. Not diaphoretic. No erythema. No pallor. NEUROLOGIC: Alert and oriented to person, place, and time. Normal reflexes, muscle tone coordination. No cranial nerve deficit noted. PSYCHIATRIC: Normal mood and affect. Normal behavior. Normal judgment and thought content. CARDIOVASCULAR: Normal heart rate noted RESPIRATORY: Effort and breath sounds normal, no problems with respiration noted ABDOMEN: Soft, no distention noted.   PELVIC: Deferred MUSCULOSKELETAL: Normal range of motion. No edema noted.  Labs and Imaging No results found.  Assessment & Plan:  1. Abnormal uterine bleeding (AUB) Will continue Megace for now, not a good surgical candidate and her AUB is controlled on Megace. Explained this to patient; she accepts this plan - megestrol (MEGACE) 40 MG tablet;  Take 2 tablets (80 mg total) by mouth 2 (two) times daily.  Dispense: 120 tablet; Refill: 12  2. Pelvic pain in female Advised to alternate Tramadol with Tylenol. - traMADol (ULTRAM) 50 MG tablet; Take 1 tablet (50 mg total) by mouth every 6 (six) hours as needed for severe pain.  Dispense: 30 tablet; Refill: 0   Routine preventative health maintenance measures emphasized. Please refer to After Visit Summary for other counseling recommendations.   Return if symptoms worsen or  fail to improve.   Total face-to-face time with patient: 15 minutes. Over 50% of encounter was spent on counseling and coordination of care.   Verita Schneiders, MD, [place] Attending [place], [facility] for [employer], [place]

## 2016-01-01 NOTE — Patient Instructions (Signed)
Return to clinic for any scheduled appointments or for any gynecologic concerns as needed.   

## 2016-01-04 ENCOUNTER — Ambulatory Visit (INDEPENDENT_AMBULATORY_CARE_PROVIDER_SITE_OTHER): Payer: Medicare Other | Admitting: Physician Assistant

## 2016-01-04 ENCOUNTER — Ambulatory Visit: Payer: Medicare Other | Admitting: Endocrinology

## 2016-01-04 ENCOUNTER — Ambulatory Visit: Payer: Medicare Other | Admitting: Physician Assistant

## 2016-01-04 ENCOUNTER — Encounter: Payer: Self-pay | Admitting: Physician Assistant

## 2016-01-04 VITALS — BP 132/72 | HR 98 | Ht 67.0 in | Wt 180.8 lb

## 2016-01-04 DIAGNOSIS — I10 Essential (primary) hypertension: Secondary | ICD-10-CM | POA: Diagnosis not present

## 2016-01-04 DIAGNOSIS — N186 End stage renal disease: Secondary | ICD-10-CM | POA: Diagnosis not present

## 2016-01-04 DIAGNOSIS — R9439 Abnormal result of other cardiovascular function study: Secondary | ICD-10-CM | POA: Diagnosis not present

## 2016-01-04 NOTE — Patient Instructions (Signed)
Medication Instructions:  Your physician recommends that you continue on your current medications as directed. Please refer to the Current Medication list given to you today.   Labwork: NONE  Testing/Procedures: NONE  Follow-Up: Your physician wants you to follow-up in: [place]. You will receive a reminder letter in the mail two months in advance. If you don't receive a letter, please call our office to schedule the follow-up appointment.   Any Other Special Instructions Will Be Listed Below (If Applicable).     If you need a refill on your cardiac medications before your next appointment, please call your pharmacy.

## 2016-01-04 NOTE — Progress Notes (Signed)
Cardiology Office Note    Date:  01/04/2016   ID:  Jade Bond, DOB 29-May-1965, MRN [id number]  PCP:  Dorena Dew, FNP  Cardiologist:   Chief Complaint  Patient presents with  . Follow-up    History of Present Illness:  Jade Bond is a 50 y.o. female who Dr. Harrington Challenger saw in 2015 for lower extremity edema and chest pain and dyspnea on exertion. She had a normal nuclear stress test in 2013 that was normal and says she had a heart catheterization about 10 yrs ago in [place] without significant findings but we had no records. She had moved from [place]. She also has history of uncontrolled DM, HTN, HLD, CKD stage III, normocytic anemia (heavy menses). 2-D echo 12/2013 showed normal LV EF 60-65% with grade 1 DD. Nuclear stress test 11/6 less 15 was normal.   Patient now has end-stage renal disease on hemodialysis and is awaiting kidney transplant. She had an abnormal stress echo at [facility] with 2 mm ST depression. Echo was normal.   One month ago after dialysis she felt like she had too much fluid taken off. She developed chest tightness when leaving and went to [facility] ER where w/u was negative. 2-D echo was done and showed severe concentric hypertrophy LVEF 75-80% with dynamic obstruction at the outflow track grade 1 DD. It was unchanged from prior echoes. Raises her 40 yr old grandson so very active with him. Denies exertional chest tightness, pressure, dyspnea, dyspnea on exertion, dizziness or presyncope. I saw Her 12/14/15 and she was scheduled for eye surgery that Wednesday but needs clearance first. Mother  With heart disease-doesn't know specifics. Never smoked. BP high today but agditated with her ride today.  She needed preoperative clearance for undergoing kidney transplant [facility]. Dr. Johnsie Cancel recommended cardiac catheterization followed by dialysis after cath. React catheterization 12/16/15 showed hyperdynamic LV systolic function EF greater than 65%, 25% proximal RCA. She had hyperdynamic LV  function with near LV systolic cavity obliteration. No significant coronary obstructive disease. The 25% RCA was very proximal which is smooth but this may of been the result of mild catheter-induced dampening. Medical therapy with more optimal BP control is recommended.  Patient comes in today feeling well. She did have her eye surgery but needs it on her left eye now. She had no trouble with the cardiac catheterization.       Past Medical History:  Diagnosis Date  . Chest pain    a. 2D echo 01/2010 - mild TR, LVEF 60% with normal wall thickness, trace MR, mild TR, normal RV, trace pericardial effusion. b. Normal nuc 03/2011 at [facility].  . CHF (congestive heart failure) ([place])   . CKD (chronic kidney disease), stage III   . Diabetes mellitus   . Diabetic retinopathy ([place])   . GERD (gastroesophageal reflux disease)   . Heart murmur    as a child  . Hyperlipidemia   . Hypertension   . Hypoalbuminemia   . Normocytic anemia   . Pneumonia 07/04/2013    Past Surgical History:  Procedure Laterality Date  . BASCILIC VEIN TRANSPOSITION Left 03/04/2015   Procedure: BASILIC VEIN TRANSPOSITION;  Surgeon: Elam Dutch, MD;  Location: [facility];  Service: Vascular;  Laterality: Left;  . BREAST SURGERY     lumpectomy  . CARDIAC CATHETERIZATION  ~ 2008   negative, no intervention needed  . CARDIAC CATHETERIZATION N/A 12/16/2015   Procedure: Left Heart Cath and Coronary Angiography;  Surgeon: Troy Sine, MD;  Location: [facility];  Service: Cardiovascular;  Laterality: N/A;  . DILATION AND CURETTAGE OF UTERUS     x 3  . EYE SURGERY     eye injections  . EYE SURGERY     right eye surgery   . TONSILLECTOMY  1980  . TUBAL LIGATION  1995    Current Medications: Outpatient Medications Prior to Visit  Medication Sig Dispense Refill  . aspirin EC 81 MG tablet Take 81 mg by mouth daily.    . calcium acetate (PHOSLO) 667 MG capsule Take 1,334 mg by mouth 3 (three) times daily with meals.     . Difluprednate (DUREZOL OP) Place 1 drop into the right eye 2 (two) times daily.    . furosemide (LASIX) 40 MG tablet Take 40 mg by mouth daily.     Marland Kitchen glipiZIDE (GLUCOTROL) 10 MG tablet Take 10 mg by mouth 2 (two) times daily before a meal.    . Insulin Glargine (LANTUS SOLOSTAR) 100 UNIT/ML Solostar Pen Inject 11 Units into the skin daily at 10 pm.    . megestrol (MEGACE) 40 MG tablet Take 2 tablets (80 mg total) by mouth 2 (two) times daily. 120 tablet 12  . metoprolol succinate (TOPROL-XL) 100 MG 24 hr tablet Take 2 tablets (200 mg total) by mouth daily. 60 tablet 3  . nitroGLYCERIN (NITROSTAT) 0.4 MG SL tablet Place 0.4 mg under the tongue every 5 (five) minutes as needed for chest pain.    Marland Kitchen PRESCRIPTION MEDICATION Apply 1 drop to eye 4 (four) times daily.    . traMADol (ULTRAM) 50 MG tablet Take 1 tablet (50 mg total) by mouth every 6 (six) hours as needed for severe pain. 30 tablet 0  . zolpidem (AMBIEN) 5 MG tablet Take 1 tablet (5 mg total) by mouth at bedtime as needed for sleep. 30 tablet 1  . multivitamin (RENA-VIT) TABS tablet Take 1 tablet by mouth daily.     No facility-administered medications prior to visit.      Allergies:   Other   Social History   Social History  . Marital status: Legally Separated    Spouse name: N/A  . Number of children: N/A  . 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 Smoking status: Never Smoker  . Smokeless tobacco: Never Used  . Alcohol use Yes     Comment: occasional drink of wine - 1 glass/week or less  . Drug use: No  . Sexual activity: Not Asked   Other Topics Concern  . None   Social History Narrative  . None     Family History:  The patient's family history includes Diabetes Mellitus II in her mother and sister; Heart attack in her father; Hypertension in her sister; Kidney disease in her father; Malignant hyperthermia in her mother.   ROS:   Please  see the history of present illness.    Review of Systems  Constitution: Negative.  HENT: Negative.   Eyes: Positive for visual disturbance.  Cardiovascular: Negative.   Respiratory: Negative.   Hematologic/Lymphatic: Negative.   Musculoskeletal: Negative.  Negative for joint pain.  Gastrointestinal: Negative.   Genitourinary: Negative.   Neurological: Negative.    All other systems reviewed and are negative.   PHYSICAL EXAM:   VS:  BP 132/72   Pulse 98   Ht 5\' 7"  (1.702 m)   Wt 180 lb  12.8 oz (82 kg)   LMP 10/22/2015 (Exact Date) Comment: pt had a tubal in 1992  BMI 28.32 kg/m   Physical Exam  GEN: Well nourished, well developed, in no acute distress Neck: no JVD, carotid bruits, or masses Cardiac:RRR; no murmurs, rubs, or gallops  Respiratory:  clear to auscultation bilaterally, normal work of breathing GI: soft, nontender, nondistended, + BS Ext: right groin and cath site without hematoma or hemorrhage otherwise  without cyanosis, clubbing, or edema, Good distal pulses bilaterally MS: no deformity or atrophy Skin: warm and dry, no rash Neuro:  Alert and Oriented x 3, Strength and sensation are intact Psych: euthymic mood, full affect  Wt Readings from Last 3 Encounters:  01/04/16 180 lb 12.8 oz (82 kg)  01/01/16 174 lb (78.9 kg)  12/16/15 176 lb (79.8 kg)      Studies/Labs Reviewed:   EKG:  EKG is not ordered today.  Recent Labs: 11/10/2015: ALT 12 12/14/2015: BUN 64; Creat 8.72; Hemoglobin 10.6; Platelets 227; Potassium 4.7; Sodium 139   Lipid Panel    Component Value Date/Time   CHOL 187 10/23/2015 1357   TRIG 136 10/23/2015 1357   HDL 35 (L) 10/23/2015 1357   CHOLHDL 5.3 (H) 10/23/2015 1357   VLDL 27 10/23/2015 1357   LDLCALC 125 10/23/2015 1357   LDLDIRECT 236.6 11/29/2013 1529    Additional studies/ records that were reviewed today include:  Cardiac catheterization 12/16/15  There is hyperdynamic left ventricular systolic function.  The left  ventricular ejection fraction is greater than 65% by visual estimate.  Prox RCA lesion, 25 %stenosed.   Hyperdynamic LV function with an ejection fraction of approximately 75% and near LV systolic cavity obliteration.   No significant coronary obstructive disease.  The left main, LAD, and left circumflex vessels are entirely normal.  The RCA may have a 25 % very proximal stenosis which is smooth but this may have been the result of mild catheter induced dampening.   RECOMMENDATION: Medical therapy with more optimal blood pressure control.       ASSESSMENT:    1. Abnormal stress test   2. Essential hypertension   3. End stage renal disease ([place])      PLAN:  In order of problems listed above:  Abnormal stress test with follow-up cath showing 25% proximal RCA which is smooth and may have been a result of mild catheter induced dampening otherwise normal coronary arteries and hyperdynamic LV function with EF 75% and near LV systolic cavity obliteration. Follow-up with Dr. Harrington Challenger in 6 months.  Essential hypertension controlled today because she took her medications.  End-stage renal disease on hemodialysis and awaiting kidney transplant at [facility]. Trying to raise $2500 to be placed on the transplant list. May be moving to [place] to be closer to her daughter.    Medication Adjustments/Labs and Tests Ordered: Current medicines are reviewed at length with the patient today.  Concerns regarding medicines are outlined above.  Medication changes, Labs and Tests ordered today are listed in the Patient Instructions below. Patient Instructions  Medication Instructions:  Your physician recommends that you continue on your current medications as directed. Please refer to the Current Medication list given to you today.   Labwork: NONE  Testing/Procedures: NONE  Follow-Up: Your physician wants you to follow-up in: [place]. You will receive a reminder letter in the mail two  months in advance. If you don't receive a letter, please call our office to schedule the follow-up appointment.  Any Other Special Instructions Will Be Listed Below (If Applicable).     If you need a refill on your cardiac medications before your next appointment, please call your pharmacy.      Sumner Boast, PA-C  01/04/2016 11:50 AM    [facility] [address], [place], [place]  [address] Phone: [phone]; Fax: [phone]

## 2016-01-18 NOTE — Progress Notes (Signed)
Jade Bond, is a 50 y.o. female  [id number]  [id number]  DOB - [birth year]  CC:  Chief Complaint  Patient presents with  . Follow-up       HPI: Jade Bond is a 50 y.o. female here for follow-up diabetes. Her last A1C was 8.4 and was about 2 months ago. She reports no significant change since last visit. Her glucose today is 218.    Allergies  Allergen Reactions  . Other Hives    Allergen: Dye used to check blood flow from dialysis port   Past Medical History:  Diagnosis Date  . Chest pain    a. 2D echo 01/2010 - mild TR, LVEF 60% with normal wall thickness, trace MR, mild TR, normal RV, trace pericardial effusion. b. Normal nuc 03/2011 at [facility].  . CHF (congestive heart failure) ([place])   . CKD (chronic kidney disease), stage III   . Diabetes mellitus   . Diabetic retinopathy ([place])   . GERD (gastroesophageal reflux disease)   . Heart murmur    as a child  . Hyperlipidemia   . Hypertension   . Hypoalbuminemia   . Normocytic anemia   . Pneumonia 07/04/2013   Current Outpatient Prescriptions on File Prior to Visit  Medication Sig Dispense Refill  . aspirin EC 81 MG tablet Take 81 mg by mouth daily.    . calcium acetate (PHOSLO) 667 MG capsule Take 1,334 mg by mouth 3 (three) times daily with meals.    . furosemide (LASIX) 40 MG tablet Take 40 mg by mouth daily.     Marland Kitchen glipiZIDE (GLUCOTROL) 10 MG tablet Take 10 mg by mouth 2 (two) times daily before a meal.    . metoprolol succinate (TOPROL-XL) 100 MG 24 hr tablet Take 2 tablets (200 mg total) by mouth daily. 60 tablet 3  . nitroGLYCERIN (NITROSTAT) 0.4 MG SL tablet Place 0.4 mg under the tongue every 5 (five) minutes as needed for chest pain.    Marland Kitchen zolpidem (AMBIEN) 5 MG tablet Take 1 tablet (5 mg total) by mouth at bedtime as needed for sleep. 30 tablet 1   No current facility-administered medications on file prior to visit.    Family History  Problem Relation Age of Onset  . Malignant hyperthermia Mother   .  Diabetes Mellitus II Mother   . Heart attack Father     Age 14  . Kidney disease Father   . Diabetes Mellitus II Sister   . Hypertension Sister    Social History   Social History  . Marital status: Legally Separated    Spouse name: N/A  . Number of children: N/A  . 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 Smoking status: Never Smoker  . Smokeless tobacco: Never Used  . Alcohol use Yes     Comment: occasional drink of wine - 1 glass/week or less  . Drug use: No  . Sexual activity: Not on file   Other Topics Concern  . Not on file   Social History Narrative  . No narrative on file    Review of Systems: Constitutional: Negative Skin: Negative HENT: Negative  Eyes: Negative  Neck: Negative Respiratory: Negative Cardiovascular: Negative Gastrointestinal: Negative Genitourinary: Negative  Musculoskeletal: Negative   Neurological: Negative for Hematological: Negative  Psychiatric/Behavioral: Negative    Objective:  There were no vitals filed for this visit.  Physical Exam: Constitutional: Patient  appears well-developed and well-nourished. No distress. HENT: Normocephalic, atraumatic, External right and left ear normal. Oropharynx is clear and moist.  Eyes: Conjunctivae and EOM are normal. PERRLA, no scleral icterus. Neck: Normal ROM. Neck supple. No lymphadenopathy, No thyromegaly. CVS: RRR, S1/S2 +, no murmurs, no gallops, no rubs Pulmonary: Effort and breath sounds normal, no stridor, rhonchi, wheezes, rales.  Abdominal: Soft. Normoactive BS,, no distension, tenderness, rebound or guarding.  Musculoskeletal: Normal range of motion. No edema and no tenderness.  Neuro: Alert.Normal muscle tone coordination. Non-focal Skin: Skin is warm and dry. No rash noted. Not diaphoretic. No erythema. No pallor. Psychiatric: Normal mood and affect. Behavior, judgment, thought content normal.  Lab  Results  Component Value Date   WBC 5.9 12/14/2015   HGB 10.6 (L) 12/14/2015   HCT 32.0 (L) 12/14/2015   MCV 97.9 12/14/2015   PLT 227 12/14/2015   Lab Results  Component Value Date   CREATININE 8.72 (H) 12/14/2015   BUN 64 (H) 12/14/2015   NA 139 12/14/2015   K 4.7 12/14/2015   CL 98 12/14/2015   CO2 28 12/14/2015    Lab Results  Component Value Date   HGBA1C 8.4 10/23/2015   Lipid Panel     Component Value Date/Time   CHOL 187 10/23/2015 1357   TRIG 136 10/23/2015 1357   HDL 35 (L) 10/23/2015 1357   CHOLHDL 5.3 (H) 10/23/2015 1357   VLDL 27 10/23/2015 1357   LDLCALC 125 10/23/2015 1357        Assessment and plan:   Diabetes.   No Follow-up on file.  The patient was given clear instructions to go to ER or return to medical center if symptoms don't improve, worsen or new problems develop. The patient verbalized understanding.    Micheline Chapman FNP  01/18/2016, 12:06 PM

## 2016-01-26 ENCOUNTER — Encounter: Payer: Self-pay | Admitting: *Deleted

## 2016-01-29 ENCOUNTER — Telehealth: Payer: Self-pay

## 2016-01-29 NOTE — Telephone Encounter (Signed)
Called patient, we did not prescribe this last and since patient sees cardiologist, we have advised her to follow up with them if this should be refilled. Thanks!

## 2016-02-10 ENCOUNTER — Ambulatory Visit (INDEPENDENT_AMBULATORY_CARE_PROVIDER_SITE_OTHER): Payer: Medicare Other | Admitting: Family Medicine

## 2016-02-10 ENCOUNTER — Encounter: Payer: Self-pay | Admitting: Family Medicine

## 2016-02-10 VITALS — BP 141/75 | HR 76 | Temp 99.1°F | Resp 16 | Ht 67.0 in | Wt 172.0 lb

## 2016-02-10 DIAGNOSIS — Z0000 Encounter for general adult medical examination without abnormal findings: Secondary | ICD-10-CM | POA: Diagnosis not present

## 2016-02-10 DIAGNOSIS — G4700 Insomnia, unspecified: Secondary | ICD-10-CM | POA: Diagnosis not present

## 2016-02-10 DIAGNOSIS — E785 Hyperlipidemia, unspecified: Secondary | ICD-10-CM

## 2016-02-10 DIAGNOSIS — Z23 Encounter for immunization: Secondary | ICD-10-CM

## 2016-02-10 DIAGNOSIS — I10 Essential (primary) hypertension: Secondary | ICD-10-CM

## 2016-02-10 DIAGNOSIS — E118 Type 2 diabetes mellitus with unspecified complications: Secondary | ICD-10-CM

## 2016-02-10 LAB — BASIC METABOLIC PANEL
BUN: 35 mg/dL — ABNORMAL HIGH (ref 7–25)
CHLORIDE: 97 mmol/L — AB (ref 98–110)
CO2: 31 mmol/L (ref 20–31)
CREATININE: 7.07 mg/dL — AB (ref 0.50–1.10)
Calcium: 9.5 mg/dL (ref 8.6–10.2)
Glucose, Bld: 212 mg/dL — ABNORMAL HIGH (ref 65–99)
Potassium: 4.2 mmol/L (ref 3.5–5.3)
Sodium: 140 mmol/L (ref 135–146)

## 2016-02-10 LAB — POCT URINALYSIS DIP (DEVICE)
BILIRUBIN URINE: NEGATIVE
GLUCOSE, UA: 500 mg/dL — AB
KETONES UR: NEGATIVE mg/dL
LEUKOCYTES UA: NEGATIVE
Nitrite: NEGATIVE
Protein, ur: >=300 mg/dL — AB
SPECIFIC GRAVITY, URINE: 1.02 (ref 1.005–1.030)
Urobilinogen, UA: 0.2 mg/dL (ref 0.0–1.0)
pH: 7 (ref 5.0–8.0)

## 2016-02-10 LAB — POCT GLYCOSYLATED HEMOGLOBIN (HGB A1C): Hemoglobin A1C: 10

## 2016-02-10 LAB — GLUCOSE, CAPILLARY: Glucose-Capillary: 198 mg/dL — ABNORMAL HIGH (ref 65–99)

## 2016-02-10 MED ORDER — GLUCOSE BLOOD VI STRP: ORAL_STRIP | 12 refills | Status: DC

## 2016-02-10 MED ORDER — ACCU-CHEK SOFT TOUCH LANCETS MISC: 12 refills | Status: DC

## 2016-02-10 MED ORDER — INSULIN ASPART 100 UNIT/ML ~~LOC~~ SOLN: 0.0000 [IU] | Freq: Three times a day (TID) | SUBCUTANEOUS | 11 refills | Status: AC

## 2016-02-10 MED ORDER — ZOLPIDEM TARTRATE 5 MG PO TABS: 5.0000 mg | ORAL_TABLET | Freq: Every evening | ORAL | 1 refills | Status: DC | PRN

## 2016-02-10 NOTE — Progress Notes (Signed)
Subjective:    Patient ID: Jade Bond, female    DOB: [birth year], 51 y.o.   MRN: [id number]  HPI Jade Bond, a 51 year old female presents for a follow up of uncontrolled hypertension and diabetes. [name] also has a history of uncontrolled hypertension and is in dialysis on Tuesday, Thursday, and Saturday. She is followed by Dr. Jeneen Rinks Deterding, nephrologist for end stage kidney disease.    She states that she has not taken antihypertensive medication today. She last took Metoprolol on last night.  She maintains that she does not exercise or follow a low fat diet. She has been consistent with taking medications. She reports that she does not smoke, but she has smokers living in her home. She states that she has had chest pains in the past. She currently denies chest pains, heart palpitations, or lower extremity edema. Previous urine studies indicated microalbuminuria.   Patient also has a history of type 2 diabetes mellitus. Previous hemoglobin a1C was 7.0 %, which had increased over the past several months. She has been following a low carbohydrate diet. Symptoms have decreased since starting current medication regimen. She has been checking blood sugars consistently at home. B  Evaluation to date has been included: fasting lipid panel, hemoglobin A1C and microalbuminuria.     Past Medical History:  Diagnosis Date  . Chest pain    a. 2D echo 01/2010 - mild TR, LVEF 60% with normal wall thickness, trace MR, mild TR, normal RV, trace pericardial effusion. b. Normal nuc 03/2011 at [facility].  . CHF (congestive heart failure) ([place])   . CKD (chronic kidney disease), stage III   . Diabetes mellitus   . Diabetic retinopathy ([place])   . GERD (gastroesophageal reflux disease)   . Heart murmur    as a child  . Hyperlipidemia   . Hypertension   . Hypoalbuminemia   . Normocytic anemia   . Pneumonia 07/04/2013   Immunization History  Administered Date(s) Administered  . Influenza Split  03/19/2011, 11/24/2011  . Influenza,inj,Quad PF,36+ Mos 11/27/2014  . Pneumococcal Polysaccharide-23 03/19/2011   Allergies  Allergen Reactions  . Other Hives    Allergen: Dye used to check blood flow from dialysis port   Social History   Social History  . Marital status: Legally Separated    Spouse name: N/A  . Number of children: N/A  . 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 Smoking status: Never Smoker  . Smokeless tobacco: Never Used  . Alcohol use Yes     Comment: occasional drink of wine - 1 glass/week or less  . Drug use: No  . Sexual activity: Not on file   Other Topics Concern  . Not on file   Social History Narrative  . No narrative on file   Review of Systems  Constitutional: Negative.   Eyes: Negative for photophobia.  Respiratory: Negative for apnea and cough.   Cardiovascular: Negative.   Endocrine: Negative for polydipsia, polyphagia and polyuria.  Genitourinary: Negative.   Skin: Negative.   Allergic/Immunologic: Negative.   Neurological: Negative.   Hematological: Negative.   Psychiatric/Behavioral: Negative.  Negative for suicidal ideas.       Objective:   Physical Exam  Constitutional: She is oriented to person, place, and time. She appears well-developed and well-nourished.  HENT:  Head: Normocephalic and atraumatic.  Right Ear: External ear normal.  Left  Ear: External ear normal.  Nose: Nose normal.  Mouth/Throat: Oropharynx is clear and moist.  Eyes: Conjunctivae and EOM are normal. Pupils are equal, round, and reactive to light.  Neck: Normal range of motion. Neck supple.  Cardiovascular: Normal rate, regular rhythm, normal heart sounds and intact distal pulses.   Pulmonary/Chest: Effort normal and breath sounds normal.  Abdominal: Soft. Bowel sounds are normal.  Neurological: She is alert and oriented to person, place, and time. She has normal reflexes.   Skin: Skin is warm, dry and intact. No rash noted.  Fistula to left upper arm    Psychiatric: She has a normal mood and affect. Her behavior is normal. Judgment and thought content normal.     BP (!) 141/75 (BP Location: Right Arm, Patient Position: Sitting, Cuff Size: Normal)   Pulse 76   Temp 99.1 F (37.3 C) (Oral)   Resp 16   Ht 5\' 7"  (1.702 m)   Wt 172 lb (78 kg)   SpO2 100%   BMI 26.94 kg/m  Assessment & Plan:  1. Type 2 diabetes mellitus with complication, unspecified long term insulin use status (HCC) Hemoglobin a1C increased to 10.0. Will start Novolog per sliding scale. Continue Lantus 10 units HS. Patient was previously referred to endocrinology.  - HgB A1c - Lancets (ACCU-CHEK SOFT TOUCH) lancets; Use as instructed  Dispense: 100 each; Refill: 12 - Basic Metabolic Panel - CBC with Differential - Microalbumin/Creatinine Ratio, Urine - insulin aspart (NOVOLOG) 100 UNIT/ML injection; Inject 0-10 Units into the skin 3 (three) times daily before meals.  Dispense: 10 mL; Refill: 11 - POCT urinalysis dip (device) - glucose blood (ACCU-CHEK AVIVA) test strip; Check blood sugars prior to meals and at bedtime.  Dispense: 100 each; Refill: 12  2. Essential hypertension Blood pressure is at goal on current medication regimen.  - POCT urinalysis dip (device)  3. Hyperlipidemia, unspecified hyperlipidemia type Reviewed previous lipid panel; will not start medications at this time. The patient is asked to make an attempt to improve diet and exercise patterns to aid in medical management of this problem.  4. Insomnia, unspecified type - zolpidem (AMBIEN) 5 MG tablet; Take 1 tablet (5 mg total) by mouth at bedtime as needed for sleep.  Dispense: 30 tablet; Refill: 1  5. Need for Tdap vaccination - Tdap vaccine greater than or equal to 7yo IM  6. Healthcare maintenance - multivitamin (RENA-VIT) TABS tablet; Take 1 tablet by mouth daily.   RTC: 1 months for diabetes  mellitus   Shahed Yeoman M, FNP   The patient was given clear instructions to go to ER or return to medical center if symptoms do not improve, worsen or new problems develop. The patient verbalized understanding. Will notify patient with laboratory results.

## 2016-02-10 NOTE — Patient Instructions (Addendum)
Will start Novolog per sliding scale for meal coverage. To check blood sugars before meals and bedtime.   Sliding Scale: Do not take rapid acting insulin for blood sugars <150 150-199= 2 units 200-249= 4 units 250-299= 6 units 300-349= 8 units >350= 10 units   Continue Lantus 10 units at bedtime   Continue a carbohydrate modified diet Diabetes and Foot Care Diabetes may cause you to have problems because of poor blood supply (circulation) to your feet and legs. This may cause the skin on your feet to become thinner, break easier, and heal more slowly. Your skin may become dry, and the skin may peel and crack. You may also have nerve damage in your legs and feet causing decreased feeling in them. You may not notice minor injuries to your feet that could lead to infections or more serious problems. Taking care of your feet is one of the most important things you can do for yourself. Follow these instructions at home:  Wear shoes at all times, even in the house. Do not go barefoot. Bare feet are easily injured.  Check your feet daily for blisters, cuts, and redness. If you cannot see the bottom of your feet, use a mirror or ask someone for help.  Wash your feet with warm water (do not use hot water) and mild soap. Then pat your feet and the areas between your toes until they are completely dry. Do not soak your feet as this can dry your skin.  Apply a moisturizing lotion or petroleum jelly (that does not contain alcohol and is unscented) to the skin on your feet and to dry, brittle toenails. Do not apply lotion between your toes.  Trim your toenails straight across. Do not dig under them or around the cuticle. File the edges of your nails with an emery board or nail file.  Do not cut corns or calluses or try to remove them with medicine.  Wear clean socks or stockings every day. Make sure they are not too tight. Do not wear knee-high stockings since they may decrease blood flow to your  legs.  Wear shoes that fit properly and have enough cushioning. To break in new shoes, wear them for just a few hours a day. This prevents you from injuring your feet. Always look in your shoes before you put them on to be sure there are no objects inside.  Do not cross your legs. This may decrease the blood flow to your feet.  If you find a minor scrape, cut, or break in the skin on your feet, keep it and the skin around it clean and dry. These areas may be cleansed with mild soap and water. Do not cleanse the area with peroxide, alcohol, or iodine.  When you remove an adhesive bandage, be sure not to damage the skin around it.  If you have a wound, look at it several times a day to make sure it is healing.  Do not use heating pads or hot water bottles. They may burn your skin. If you have lost feeling in your feet or legs, you may not know it is happening until it is too late.  Make sure your health care provider performs a complete foot exam at least annually or more often if you have foot problems. Report any cuts, sores, or bruises to your health care provider immediately. Contact a health care provider if:  You have an injury that is not healing.  You have cuts or breaks in  the skin.  You have an ingrown nail.  You notice redness on your legs or feet.  You feel burning or tingling in your legs or feet.  You have pain or cramps in your legs and feet.  Your legs or feet are numb.  Your feet always feel cold. Get help right away if:  There is increasing redness, swelling, or pain in or around a wound.  There is a red line that goes up your leg.  Pus is coming from a wound.  You develop a fever or as directed by your health care provider.  You notice a bad smell coming from an ulcer or wound. This information is not intended to replace advice given to you by your health care provider. Make sure you discuss any questions you have with your health care provider. Document  Released: 01/15/2000 Document Revised: 06/25/2015 Document Reviewed: 06/26/2012 Elsevier Interactive Patient Education  2017 [employer].

## 2016-02-11 ENCOUNTER — Other Ambulatory Visit: Payer: Self-pay | Admitting: Family Medicine

## 2016-02-11 DIAGNOSIS — E118 Type 2 diabetes mellitus with unspecified complications: Secondary | ICD-10-CM

## 2016-02-11 LAB — CBC WITH DIFFERENTIAL/PLATELET
BASOS PCT: 0 %
Basophils Absolute: 0 cells/uL (ref 0–200)
EOS ABS: 240 {cells}/uL (ref 15–500)
EOS PCT: 4 %
HCT: 41.7 % (ref 35.0–45.0)
Hemoglobin: 13.2 g/dL (ref 11.7–15.5)
Lymphocytes Relative: 37 %
Lymphs Abs: 2220 cells/uL (ref 850–3900)
MCH: 32 pg (ref 27.0–33.0)
MCHC: 31.7 g/dL — ABNORMAL LOW (ref 32.0–36.0)
MCV: 101.2 fL — ABNORMAL HIGH (ref 80.0–100.0)
MONOS PCT: 3 %
MPV: 10.1 fL (ref 7.5–12.5)
Monocytes Absolute: 180 cells/uL — ABNORMAL LOW (ref 200–950)
NEUTROS ABS: 3360 {cells}/uL (ref 1500–7800)
Neutrophils Relative %: 56 %
PLATELETS: 284 10*3/uL (ref 140–400)
RBC: 4.12 MIL/uL (ref 3.80–5.10)
RDW: 16.6 % — ABNORMAL HIGH (ref 11.0–15.0)
WBC: 6 10*3/uL (ref 3.8–10.8)

## 2016-02-11 LAB — MICROALBUMIN / CREATININE URINE RATIO
CREATININE, URINE: 159 mg/dL (ref 20–320)
MICROALB UR: 66.9 mg/dL
MICROALB/CREAT RATIO: 421 ug/mg{creat} — AB (ref <30)

## 2016-02-11 MED ORDER — ACCU-CHEK SOFT TOUCH LANCETS MISC: 12 refills | Status: AC

## 2016-02-12 ENCOUNTER — Encounter: Payer: Self-pay | Admitting: Internal Medicine

## 2016-02-12 ENCOUNTER — Ambulatory Visit (INDEPENDENT_AMBULATORY_CARE_PROVIDER_SITE_OTHER): Payer: Medicare Other | Admitting: Internal Medicine

## 2016-02-12 VITALS — BP 143/71 | HR 83 | Ht 67.0 in | Wt 176.1 lb

## 2016-02-12 DIAGNOSIS — E785 Hyperlipidemia, unspecified: Secondary | ICD-10-CM

## 2016-02-12 DIAGNOSIS — I10 Essential (primary) hypertension: Secondary | ICD-10-CM

## 2016-02-12 MED ORDER — ROSUVASTATIN CALCIUM 10 MG PO TABS: 10.0000 mg | ORAL_TABLET | Freq: Every day | ORAL | 3 refills | Status: DC

## 2016-02-12 NOTE — Patient Instructions (Signed)
Your physician has recommended you make the following change in your medication:  1.) rosuvastatin (Crestor) 10 mg - take one tablet once a day for lipids.  Your physician wants you to follow-up in: 9 months with Dr. Harrington Challenger.  You will receive a reminder letter in the mail two months in advance. If you don't receive a letter, please call our office to schedule the follow-up appointment.  Take the prescription with you for blood work- have lipids checked again in 8 weeks after starting Crestor.  Please have results faxed to Dr. Harrington Challenger [phone].

## 2016-02-12 NOTE — Progress Notes (Signed)
Cardiology Office Note   Date:  02/12/2016   ID:  Jade Bond, DOB Mar 15, 1965, MRN [id number]  PCP:  Dorena Dew, FNP  Cardiologist:   Dorris Carnes, MD    F/U of dyspnea and HTN     History of Present Illness: Jade Bond is a 51 y.o. female with a history of DOE and LE edema as well ahs HTN, DM, HL, CKD, anemia  Stress echo at [facility]    A couple months ago she was admtited with chest tightness Echo showed concentric LVH  LVEF 75 to 80%Cath showed no signif CAD  25% prox RCA          Current Meds  Medication Sig  . aspirin EC 81 MG tablet Take 81 mg by mouth daily.  . calcium acetate (PHOSLO) 667 MG capsule Take 1,334 mg by mouth 3 (three) times daily with meals.  Marland Kitchen glipiZIDE (GLUCOTROL) 10 MG tablet Take 10 mg by mouth 2 (two) times daily before a meal.  . glucose blood (ACCU-CHEK AVIVA) test strip Check blood sugars prior to meals and at bedtime.  . insulin aspart (NOVOLOG) 100 UNIT/ML injection Inject 0-10 Units into the skin 3 (three) times daily before meals.  . Insulin Glargine (LANTUS SOLOSTAR) 100 UNIT/ML Solostar Pen Inject 11 Units into the skin daily at 10 pm.  . Lancets (ACCU-CHEK SOFT TOUCH) lancets Check blood sugar prior to meals and bedtime  . megestrol (MEGACE) 40 MG tablet Take 2 tablets (80 mg total) by mouth 2 (two) times daily.  . metoprolol succinate (TOPROL-XL) 100 MG 24 hr tablet Take 2 tablets (200 mg total) by mouth daily.  . multivitamin (RENA-VIT) TABS tablet Take 1 tablet by mouth daily.  . nitroGLYCERIN (NITROSTAT) 0.4 MG SL tablet Place 0.4 mg under the tongue every 5 (five) minutes as needed for chest pain.  . traMADol (ULTRAM) 50 MG tablet Take 1 tablet (50 mg total) by mouth every 6 (six) hours as needed for severe pain.  Marland Kitchen zolpidem (AMBIEN) 5 MG tablet Take 1 tablet (5 mg total) by mouth at bedtime as needed for sleep.     Allergies:   Other   Past Medical History:  Diagnosis Date  . Chest pain    a. 2D echo 01/2010 - mild TR, LVEF  60% with normal wall thickness, trace MR, mild TR, normal RV, trace pericardial effusion. b. Normal nuc 03/2011 at [facility].  . CHF (congestive heart failure) ([place])   . CKD (chronic kidney disease), stage III   . Diabetes mellitus   . Diabetic retinopathy ([place])   . GERD (gastroesophageal reflux disease)   . Heart murmur    as a child  . Hyperlipidemia   . Hypertension   . Hypoalbuminemia   . Normocytic anemia   . Pneumonia 07/04/2013    Past Surgical History:  Procedure Laterality Date  . BASCILIC VEIN TRANSPOSITION Left 03/04/2015   Procedure: BASILIC VEIN TRANSPOSITION;  Surgeon: Elam Dutch, MD;  Location: [facility];  Service: Vascular;  Laterality: Left;  . BREAST SURGERY     lumpectomy  . CARDIAC CATHETERIZATION  ~ 2008   negative, no intervention needed  . CARDIAC CATHETERIZATION N/A 12/16/2015   Procedure: Left Heart Cath and Coronary Angiography;  Surgeon: Troy Sine, MD;  Location: [facility];  Service: Cardiovascular;  Laterality: N/A;  . DILATION AND CURETTAGE OF UTERUS     x 3  . EYE SURGERY     eye injections  . EYE SURGERY  right eye surgery   . TONSILLECTOMY  1980  . TUBAL LIGATION  1995     Social History:  The patient  reports that she has never smoked. She has never used smokeless tobacco. She reports that she drinks alcohol. She reports that she does not use drugs.   Family History:  The patient's family history includes Diabetes Mellitus II in her mother and sister; Heart attack in her father; Hypertension in her sister; Kidney disease in her father; Malignant hyperthermia in her mother.    ROS:  Please see the history of present illness. All other systems are reviewed and  Negative to the above problem except as noted.    PHYSICAL EXAM: VS:  BP (!) 143/71   Pulse 83   Ht 5\' 7"  (1.702 m)   Wt 176 lb 1.9 oz (79.9 kg)   BMI 27.58 kg/m   GEN: Well nourished, well developed, in no acute distress  HEENT: normal  Neck: no JVD, carotid bruits,  or masses Cardiac: RRR; II/VI systolic murmur  No rubs, or gallops,no edema  Respiratory:  clear to auscultation bilaterally, normal work of breathing GI: soft, nontender, nondistended, + BS  No hepatomegaly  MS: no deformity Moving all extremities   Skin: warm and dry, no rash Neuro:  Strength and sensation are intact Psych: euthymic mood, full affect   EKG:  EKG is not  ordered today.   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  The patient does not have concerns regarding medicines.  Signed, Dorris Carnes, MD  02/12/2016 2:03 PM    [address], [place], [place]  [address] Phone: [phone]; Fax: [phone]

## 2016-02-26 ENCOUNTER — Ambulatory Visit: Payer: Medicare Other | Admitting: Family Medicine

## 2016-03-08 ENCOUNTER — Ambulatory Visit: Payer: Medicare Other | Admitting: Endocrinology

## 2016-03-14 ENCOUNTER — Encounter: Payer: Self-pay | Admitting: Family Medicine

## 2016-03-14 ENCOUNTER — Ambulatory Visit (INDEPENDENT_AMBULATORY_CARE_PROVIDER_SITE_OTHER): Payer: Medicare Other | Admitting: Family Medicine

## 2016-03-14 VITALS — BP 161/72 | HR 83 | Temp 98.7°F | Resp 16 | Ht 67.0 in | Wt 179.0 lb

## 2016-03-14 DIAGNOSIS — I10 Essential (primary) hypertension: Secondary | ICD-10-CM

## 2016-03-14 DIAGNOSIS — G4700 Insomnia, unspecified: Secondary | ICD-10-CM

## 2016-03-14 DIAGNOSIS — E1165 Type 2 diabetes mellitus with hyperglycemia: Secondary | ICD-10-CM | POA: Diagnosis not present

## 2016-03-14 DIAGNOSIS — Z794 Long term (current) use of insulin: Secondary | ICD-10-CM | POA: Diagnosis not present

## 2016-03-14 LAB — POCT GLYCOSYLATED HEMOGLOBIN (HGB A1C): Hemoglobin A1C: 10.5

## 2016-03-14 LAB — GLUCOSE, CAPILLARY: Glucose-Capillary: 228 mg/dL — ABNORMAL HIGH (ref 65–99)

## 2016-03-14 MED ORDER — INSULIN GLARGINE 100 UNIT/ML SOLOSTAR PEN: 15.0000 [IU] | PEN_INJECTOR | Freq: Every day | SUBCUTANEOUS | 11 refills | Status: DC

## 2016-03-14 MED ORDER — ZOLPIDEM TARTRATE 5 MG PO TABS: 5.0000 mg | ORAL_TABLET | Freq: Every evening | ORAL | 1 refills | Status: DC | PRN

## 2016-03-14 NOTE — Progress Notes (Signed)
Subjective:    Patient ID: Jade Bond, female    DOB: [birth year], 51 y.o.   MRN: [id number]  HPI Jade Bond, a 51 year old female presents for a follow up of uncontrolled  diabetes. Jade Bond also has a history of uncontrolled hypertension and is in dialysis on Tuesday, Thursday, and Saturday. She is followed by Dr. Jeneen Rinks Deterding, nephrologist for end stage kidney disease.    She states that she has not taken antihypertensive medication today. She last took Metoprolol on last night.  She maintains that she does not exercise or follow a low fat diet. She has been consistent with taking medications. She reports that she does not smoke, but she has smokers living in her home. She states that she has had chest pains in the past. She currently denies chest pains, heart palpitations, or lower extremity edema. Previous urine studies indicated microalbuminuria.   Patient also has a history of type 2 diabetes mellitus. Previous hemoglobin a1C was 10.2 %, which had increased over the past several months. She has been following a low carbohydrate diet. Symptoms have decreased since starting current medication regimen. She has been checking blood sugars consistently at home. She has started sliding scale, but often forgets Lantus prior to bed.   Evaluation to date has been included: fasting lipid panel, hemoglobin A1C and microalbuminuria.     Past Medical History:  Diagnosis Date  . Chest pain    a. 2D echo 01/2010 - mild TR, LVEF 60% with normal wall thickness, trace MR, mild TR, normal RV, trace pericardial effusion. b. Normal nuc 03/2011 at [facility].  . CHF (congestive heart failure) ([place])   . CKD (chronic kidney disease), stage III   . Diabetes mellitus   . Diabetic retinopathy ([place])   . GERD (gastroesophageal reflux disease)   . Heart murmur    as a child  . Hyperlipidemia   . Hypertension   . Hypoalbuminemia   . Normocytic anemia   . Pneumonia 07/04/2013   Immunization History   Administered Date(s) Administered  . Influenza Split 03/19/2011, 11/24/2011  . Influenza,inj,Quad PF,36+ Mos 11/27/2014  . Pneumococcal Polysaccharide-23 03/19/2011  . Tdap 02/10/2016   Allergies  Allergen Reactions  . Other Hives    Allergen: Dye used to check blood flow from dialysis port   Social History   Social History  . Marital status: Legally Separated    Spouse name: N/A  . Number of children: N/A  . 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 Smoking status: Never Smoker  . Smokeless tobacco: Never Used  . Alcohol use Yes     Comment: occasional drink of wine - 1 glass/week or less  . Drug use: No  . Sexual activity: Not on file   Other Topics Concern  . Not on file   Social History Narrative  . No narrative on file   Review of Systems  Constitutional: Negative.   Eyes: Negative for photophobia.  Respiratory: Negative for apnea and cough.   Cardiovascular: Negative.   Endocrine: Negative for polydipsia, polyphagia and polyuria.  Genitourinary: Negative.   Skin: Negative.   Allergic/Immunologic: Negative.   Neurological: Negative.   Hematological: Negative.   Psychiatric/Behavioral: Negative.  Negative for suicidal ideas.       Objective:   Physical Exam  Constitutional: She is oriented to person, place, and time. She appears well-developed and well-nourished.  HENT:  Head: Normocephalic and atraumatic.  Right Ear: External ear normal.  Left Ear: External ear normal.  Nose: Nose normal.  Mouth/Throat: Oropharynx is clear and moist.  Eyes: Conjunctivae and EOM are normal. Pupils are equal, round, and reactive to light.  Neck: Normal range of motion. Neck supple.  Cardiovascular: Normal rate, regular rhythm, normal heart sounds and intact distal pulses.   Pulmonary/Chest: Effort normal and breath sounds normal.  Abdominal: Soft. Bowel sounds are normal.  Neurological: She is  alert and oriented to person, place, and time. She has normal reflexes.  Skin: Skin is warm, dry and intact. No rash noted.  Fistula to left upper arm    Psychiatric: She has a normal mood and affect. Her behavior is normal. Judgment and thought content normal.     BP (!) 161/72 (BP Location: Right Arm, Patient Position: Sitting, Cuff Size: Normal)   Pulse 83   Temp 98.7 F (37.1 C) (Oral)   Resp 16   Ht 5\' 7"  (1.702 m)   Wt 179 lb (81.2 kg)   SpO2 98%   BMI 28.04 kg/m  Assessment & Plan:  1. Type 2 diabetes mellitus with complication, unspecified long term insulin use status (HCC) Hemoglobin a1C increased to 10.5. Will continue Novolog per sliding scale. Continue Lantus 10 units HS. Patient was previously referred to endocrinology.  - Insulin Glargine (LANTUS SOLOSTAR) 100 UNIT/ML Solostar Pen; Inject 15 Units into the skin daily at 10 pm.  Dispense: 15 mL; Refill: 11 - HgB A1c  2. Essential hypertension Patient did not take anti-hypertensive medication prior to arrival.   3. Insomnia, unspecified type - zolpidem (AMBIEN) 5 MG tablet; Take 1 tablet (5 mg total) by mouth at bedtime as needed for sleep.  Dispense: 30 tablet; Refill: 1  RTC: 1 months for diabetes mellitus   Phong Isenberg M, FNP   The patient was given clear instructions to go to ER or return to medical center if symptoms do not improve, worsen or new problems develop. The patient verbalized understanding. Will notify patient with laboratory results.

## 2016-03-14 NOTE — Patient Instructions (Addendum)
Diabetes Mellitus and Food It is important for you to manage your blood sugar (glucose) level. Your blood glucose level can be greatly affected by what you eat. Eating healthier foods in the appropriate amounts throughout the day at about the same time each day will help you control your blood glucose level. It can also help slow or prevent worsening of your diabetes mellitus. Healthy eating may even help you improve the level of your blood pressure and reach or maintain a healthy weight. General recommendations for healthful eating and cooking habits include:  Eating meals and snacks regularly. Avoid going long periods of time without eating to lose weight.  Eating a diet that consists mainly of plant-based foods, such as fruits, vegetables, nuts, legumes, and whole grains.  Using low-heat cooking methods, such as baking, instead of high-heat cooking methods, such as deep frying.  Work with your dietitian to make sure you understand how to use the Nutrition Facts information on food labels. How can food affect me? Carbohydrates Carbohydrates affect your blood glucose level more than any other type of food. Your dietitian will help you determine how many carbohydrates to eat at each meal and teach you how to count carbohydrates. Counting carbohydrates is important to keep your blood glucose at a healthy level, especially if you are using insulin or taking certain medicines for diabetes mellitus. Alcohol Alcohol can cause sudden decreases in blood glucose (hypoglycemia), especially if you use insulin or take certain medicines for diabetes mellitus. Hypoglycemia can be a life-threatening condition. Symptoms of hypoglycemia (sleepiness, dizziness, and disorientation) are similar to symptoms of having too much alcohol. If your health care provider has given you approval to drink alcohol, do so in moderation and use the following guidelines:  Women should not have more than one drink per day, and men  should not have more than two drinks per day. One drink is equal to: ? 12 oz of beer. ? 5 oz of wine. ? 1 oz of hard liquor.  Do not drink on an empty stomach.  Keep yourself hydrated. Have water, diet soda, or unsweetened iced tea.  Regular soda, juice, and other mixers might contain a lot of carbohydrates and should be counted.  What foods are not recommended? As you make food choices, it is important to remember that all foods are not the same. Some foods have fewer nutrients per serving than other foods, even though they might have the same number of calories or carbohydrates. It is difficult to get your body what it needs when you eat foods with fewer nutrients. Examples of foods that you should avoid that are high in calories and carbohydrates but low in nutrients include:  Trans fats (most processed foods list trans fats on the Nutrition Facts label).  Regular soda.  Juice.  Candy.  Sweets, such as cake, pie, doughnuts, and cookies.  Fried foods.  What foods can I eat? Eat nutrient-rich foods, which will nourish your body and keep you healthy. The food you should eat also will depend on several factors, including:  The calories you need.  The medicines you take.  Your weight.  Your blood glucose level.  Your blood pressure level.  Your cholesterol level.  You should eat a variety of foods, including:  Protein. ? Lean cuts of meat. ? Proteins low in saturated fats, such as fish, egg whites, and beans. Avoid processed meats.  Fruits and vegetables. ? Fruits and vegetables that may help control blood glucose levels, such as apples,   mangoes, and yams.  Dairy products. ? Choose fat-free or low-fat dairy products, such as milk, yogurt, and cheese.  Grains, bread, pasta, and rice. ? Choose whole grain products, such as multigrain bread, whole oats, and brown rice. These foods may help control blood pressure.  Fats. ? Foods containing healthful fats, such as  nuts, avocado, olive oil, canola oil, and fish.  Does everyone with diabetes mellitus have the same meal plan? Because every person with diabetes mellitus is different, there is not one meal plan that works for everyone. It is very important that you meet with a dietitian who will help you create a meal plan that is just right for you. This information is not intended to replace advice given to you by your health care provider. Make sure you discuss any questions you have with your health care provider. Document Released: 10/14/2004 Document Revised: 06/25/2015 Document Reviewed: 12/14/2012 Elsevier Interactive Patient Education  2017 [employer] Diabetes and Foot Care Diabetes may cause you to have problems because of poor blood supply (circulation) to your feet and legs. This may cause the skin on your feet to become thinner, break easier, and heal more slowly. Your skin may become dry, and the skin may peel and crack. You may also have nerve damage in your legs and feet causing decreased feeling in them. You may not notice minor injuries to your feet that could lead to infections or more serious problems. Taking care of your feet is one of the most important things you can do for yourself. Follow these instructions at home:  Wear shoes at all times, even in the house. Do not go barefoot. Bare feet are easily injured.  Check your feet daily for blisters, cuts, and redness. If you cannot see the bottom of your feet, use a mirror or ask someone for help.  Wash your feet with warm water (do not use hot water) and mild soap. Then pat your feet and the areas between your toes until they are completely dry. Do not soak your feet as this can dry your skin.  Apply a moisturizing lotion or petroleum jelly (that does not contain alcohol and is unscented) to the skin on your feet and to dry, brittle toenails. Do not apply lotion between your toes.  Trim your toenails straight across. Do not dig under  them or around the cuticle. File the edges of your nails with an emery board or nail file.  Do not cut corns or calluses or try to remove them with medicine.  Wear clean socks or stockings every day. Make sure they are not too tight. Do not wear knee-high stockings since they may decrease blood flow to your legs.  Wear shoes that fit properly and have enough cushioning. To break in new shoes, wear them for just a few hours a day. This prevents you from injuring your feet. Always look in your shoes before you put them on to be sure there are no objects inside.  Do not cross your legs. This may decrease the blood flow to your feet.  If you find a minor scrape, cut, or break in the skin on your feet, keep it and the skin around it clean and dry. These areas may be cleansed with mild soap and water. Do not cleanse the area with peroxide, alcohol, or iodine.  When you remove an adhesive bandage, be sure not to damage the skin around it.  If you have a wound, look at it several times   to make sure it is healing.  Do not use heating pads or hot water bottles. They may burn your skin. If you have lost feeling in your feet or legs, you may not know it is happening until it is too late.  Make sure your health care provider performs a complete foot exam at least annually or more often if you have foot problems. Report any cuts, sores, or bruises to your health care provider immediately. Contact a health care provider if:  You have an injury that is not healing.  You have cuts or breaks in the skin.  You have an ingrown nail.  You notice redness on your legs or feet.  You feel burning or tingling in your legs or feet.  You have pain or cramps in your legs and feet.  Your legs or feet are numb.  Your feet always feel cold. Get help right away if:  There is increasing redness, swelling, or pain in or around a wound.  There is a red line that goes up your leg.  Pus is coming from a  wound.  You develop a fever or as directed by your health care provider.  You notice a bad smell coming from an ulcer or wound. This information is not intended to replace advice given to you by your health care provider. Make sure you discuss any questions you have with your health care provider. Document Released: 01/15/2000 Document Revised: 06/25/2015 Document Reviewed: 06/26/2012 Elsevier Interactive Patient Education  2017 [place].  Hyperglycemia Hyperglycemia occurs when the level of sugar (glucose) in the blood is too high. Glucose is a type of sugar that provides the body's main source of energy. Certain hormones (insulin and glucagon) control the level of glucose in the blood. Insulin lowers blood glucose, and glucagon increases blood glucose. Hyperglycemia can result from having too little insulin in the bloodstream, or from the body not responding normally to insulin. Hyperglycemia occurs most often in people who have diabetes (diabetes mellitus), but it can happen in people who do not have diabetes. It can develop quickly, and it can be life-threatening if it causes you to become severely dehydrated (diabetic ketoacidosis or hyperglycemic hyperosmolar state). Severe hyperglycemia is a medical emergency. What are the causes? If you have diabetes, hyperglycemia may be caused by:  Diabetes medicine.  Medicines that increase blood glucose or affect your diabetes control.  Not eating enough, or not eating often enough.  Changes in physical activity level.  Being sick or having an infection. If you have prediabetes or undiagnosed diabetes:  Hyperglycemia may be caused by those conditions. If you do not have diabetes, hyperglycemia may be caused by:  Certain medicines, including steroid medicines, beta-blockers, epinephrine, and thiazide diuretics.  Stress.  Serious illness.  Surgery.  Diseases of the pancreas.  Infection. What increases the risk? Hyperglycemia is  more likely to develop in people who have risk factors for diabetes, such as:  Having a family member with diabetes.  Having a gene for type 1 diabetes that is passed from parent to child (inherited).  Living in an area with cold weather conditions.  Exposure to certain viruses.  Certain conditions in which the body's disease-fighting (immune) system attacks itself (autoimmune disorders).  Being overweight or obese.  Having an inactive (sedentary) lifestyle.  Having been diagnosed with insulin resistance.  Having a history of prediabetes, gestational diabetes, or polycystic ovarian syndrome (PCOS).  Being of American-Indian, African-American, Hispanic/Latino, or Asian/Pacific Islander descent. What are the signs  or symptoms? Hyperglycemia may not cause any symptoms. If you do have symptoms, they may include early warning signs, such as:  Increased thirst.  Hunger.  Feeling very tired.  Needing to urinate more often than usual.  Blurry vision. Other symptoms may develop if hyperglycemia gets worse, such as:  Dry mouth.  Loss of appetite.  Fruity-smelling breath.  Weakness.  Unexpected or rapid weight gain or weight loss.  Tingling or numbness in the hands or feet.  Headache.  Skin that does not quickly return to normal after being lightly pinched and released (poor skin turgor).  Abdominal pain.  Cuts or bruises that are slow to heal. How is this diagnosed? Hyperglycemia is diagnosed with a blood test to measure your blood glucose level. This blood test is usually done while you are having symptoms. Your health care provider may also do a physical exam and review your medical history. You may have more tests to determine the cause of your hyperglycemia, such as:  A fasting blood glucose (FBG) test. You will not be allowed to eat (you will fast) for at least 8 hours before a blood sample is taken.  An A1c (hemoglobin A1c) blood test. This provides information  about blood glucose control over the previous 2-3 months.  An oral glucose tolerance test (OGTT). This measures your blood glucose at two times:  After fasting. This is your baseline blood glucose level.  Two hours after drinking a beverage that contains glucose. How is this treated? Treatment depends on the cause of your hyperglycemia. Treatment may include:  Taking medicine to regulate your blood glucose levels. If you take insulin or other diabetes medicines, your medicine or dosage may be adjusted.  Lifestyle changes, such as exercising more, eating healthier foods, or losing weight.  Treating an illness or infection, if this caused your hyperglycemia.  Checking your blood glucose more often.  Stopping or reducing steroid medicines, if these caused your hyperglycemia. If your hyperglycemia becomes severe and it results in hyperglycemic hyperosmolar state, you must be hospitalized and given IV fluids. Follow these instructions at home: General instructions  Take over-the-counter and prescription medicines only as told by your health care provider.  Do not use any products that contain nicotine or tobacco, such as cigarettes and e-cigarettes. If you need help quitting, ask your health care provider.  Limit alcohol intake to no more than 1 drink per day for nonpregnant women and 2 drinks per day for men. One drink equals 12 oz of beer, 5 oz of wine, or 1 oz of hard liquor.  Learn to manage stress. If you need help with this, ask your health care provider.  Keep all follow-up visits as told by your health care provider. This is important. Eating and drinking  Maintain a healthy weight.  Exercise regularly, as directed by your health care provider.  Stay hydrated, especially when you exercise, get sick, or spend time in hot temperatures.  Eat healthy foods, such as:  Lean proteins.  Complex carbohydrates.  Fresh fruits and vegetables.  Low-fat dairy  products.  Healthy fats.  Drink enough fluid to keep your urine clear or pale yellow. If you have diabetes:   Make sure you know the symptoms of hyperglycemia.  Follow your diabetes management plan, as told by your health care provider. Make sure you:  Take your insulin and medicines as directed.  Follow your exercise plan.  Follow your meal plan. Eat on time, and do not skip meals.  Check your  blood glucose as often as directed. Make sure to check your blood glucose before and after exercise. If you exercise longer or in a different way than usual, check your blood glucose more often.  Follow your sick day plan whenever you cannot eat or drink normally. Make this plan in advance with your health care provider.  Share your diabetes management plan with people in your workplace, school, and household.  Check your urine for ketones when you are ill and as told by your health care provider.  Carry a medical alert card or wear medical alert jewelry. Contact a health care provider if:  Your blood glucose is at or above 240 mg/dL (13.3 mmol/L) for 2 days in a row.  You have problems keeping your blood glucose in your target range.  You have frequent episodes of hyperglycemia. Get help right away if:  You have difficulty breathing.  You have a change in how you think, feel, or act (mental status).  You have nausea or vomiting that does not go away. These symptoms may represent a serious problem that is an emergency. Do not wait to see if the symptoms will go away. Get medical help right away. Call your local emergency services (911 in the [place] Do not drive yourself to the hospital.  Summary  Hyperglycemia occurs when the level of sugar (glucose) in the blood is too high.  Hyperglycemia is diagnosed with a blood test to measure your blood glucose level. This blood test is usually done while you are having symptoms. Your health care provider may also do a physical exam and  review your medical history.  If you have diabetes, follow your diabetes management plan as told by your health care provider.  Contact your health care provider if you have problems keeping your blood glucose in your target range. This information is not intended to replace advice given to you by your health care provider. Make sure you discuss any questions you have with your health care provider. Document Released: 07/13/2000 Document Revised: 10/05/2015 Document Reviewed: 10/05/2015 Elsevier Interactive Patient Education  2017 [employer].

## 2016-05-11 ENCOUNTER — Telehealth: Payer: Self-pay

## 2016-05-11 NOTE — Telephone Encounter (Signed)
Request for [facility]. LOV 03/2016. Please advise. Thanks!

## 2016-05-12 ENCOUNTER — Other Ambulatory Visit: Payer: Self-pay | Admitting: Family Medicine

## 2016-05-12 DIAGNOSIS — G4700 Insomnia, unspecified: Secondary | ICD-10-CM

## 2016-05-12 MED ORDER — ZOLPIDEM TARTRATE 5 MG PO TABS: 5.0000 mg | ORAL_TABLET | Freq: Every evening | ORAL | 1 refills | Status: DC | PRN

## 2016-05-12 NOTE — Progress Notes (Signed)
Meds ordered this encounter  Medications  . zolpidem (AMBIEN) 5 MG tablet    Sig: Take 1 tablet (5 mg total) by mouth at bedtime as needed for sleep.    Dispense:  30 tablet    Refill:  1    Order Specific Question:   Supervising Provider    Answer:   Tresa Garter [[id number]]    Donia Pounds  MSN, FNP-C [facility] [address] [place], [place] [address] [phone]

## 2016-05-30 ENCOUNTER — Other Ambulatory Visit: Payer: Self-pay | Admitting: Family Medicine

## 2016-06-13 ENCOUNTER — Ambulatory Visit (INDEPENDENT_AMBULATORY_CARE_PROVIDER_SITE_OTHER): Payer: Medicare Other | Admitting: Family Medicine

## 2016-06-13 ENCOUNTER — Encounter: Payer: Self-pay | Admitting: Family Medicine

## 2016-06-13 VITALS — BP 149/80 | HR 72 | Temp 98.3°F | Resp 16 | Ht 67.0 in | Wt 181.0 lb

## 2016-06-13 DIAGNOSIS — B9689 Other specified bacterial agents as the cause of diseases classified elsewhere: Secondary | ICD-10-CM

## 2016-06-13 DIAGNOSIS — G4700 Insomnia, unspecified: Secondary | ICD-10-CM | POA: Diagnosis not present

## 2016-06-13 DIAGNOSIS — L298 Other pruritus: Secondary | ICD-10-CM

## 2016-06-13 DIAGNOSIS — E1165 Type 2 diabetes mellitus with hyperglycemia: Secondary | ICD-10-CM | POA: Diagnosis not present

## 2016-06-13 DIAGNOSIS — Z794 Long term (current) use of insulin: Secondary | ICD-10-CM

## 2016-06-13 DIAGNOSIS — N898 Other specified noninflammatory disorders of vagina: Secondary | ICD-10-CM

## 2016-06-13 DIAGNOSIS — N760 Acute vaginitis: Secondary | ICD-10-CM | POA: Diagnosis not present

## 2016-06-13 DIAGNOSIS — N949 Unspecified condition associated with female genital organs and menstrual cycle: Secondary | ICD-10-CM

## 2016-06-13 LAB — POCT WET PREP (WET MOUNT): Trichomonas Wet Prep HPF POC: ABSENT

## 2016-06-13 LAB — POCT URINALYSIS DIP (DEVICE)
BILIRUBIN URINE: NEGATIVE
GLUCOSE, UA: 500 mg/dL — AB
Ketones, ur: NEGATIVE mg/dL
NITRITE: NEGATIVE
Protein, ur: >=300 mg/dL — AB
SPECIFIC GRAVITY, URINE: 1.02 (ref 1.005–1.030)
Urobilinogen, UA: 0.2 mg/dL (ref 0.0–1.0)
pH: 7 (ref 5.0–8.0)

## 2016-06-13 LAB — GLUCOSE, CAPILLARY: GLUCOSE-CAPILLARY: 280 mg/dL — AB (ref 65–99)

## 2016-06-13 LAB — POCT GLYCOSYLATED HEMOGLOBIN (HGB A1C): HEMOGLOBIN A1C: 10.8

## 2016-06-13 MED ORDER — INSULIN DETEMIR 100 UNIT/ML FLEXPEN: 20.0000 [IU] | PEN_INJECTOR | Freq: Every day | SUBCUTANEOUS | 11 refills | Status: DC

## 2016-06-13 MED ORDER — ZOLPIDEM TARTRATE 5 MG PO TABS: 5.0000 mg | ORAL_TABLET | Freq: Every evening | ORAL | 1 refills | Status: DC | PRN

## 2016-06-13 MED ORDER — INSULIN GLARGINE 100 UNIT/ML SOLOSTAR PEN: 25.0000 [IU] | PEN_INJECTOR | Freq: Every day | SUBCUTANEOUS | 11 refills | Status: DC

## 2016-06-13 MED ORDER — VALACYCLOVIR HCL 500 MG PO TABS: 500.0000 mg | ORAL_TABLET | Freq: Every day | ORAL | 2 refills | Status: DC

## 2016-06-13 MED ORDER — "INSULIN SYRINGE 31G X 5/16"" 1 ML MISC": 1.0000 | Freq: Three times a day (TID) | 5 refills | Status: AC

## 2016-06-13 MED ORDER — METRONIDAZOLE 0.75 % VA GEL
1.0000 | Freq: Every day | VAGINAL | 0 refills | Status: AC
Start: 2016-06-13 — End: 2016-06-18

## 2016-06-13 NOTE — Progress Notes (Signed)
[name], a 51 year old patient with a history of diabetes mellitus, hypertension, and end stage renal disease presents for a follow up of diabetes.   She is currently going to dialysis several times per week. She says that glucose has been elevated on current pain medication regimen.    Diabetes  She presents for her follow-up diabetic visit. She has type 2 diabetes mellitus. Pertinent negatives for hypoglycemia include no confusion, headaches, mood changes, nervousness/anxiousness, speech difficulty, sweats or tremors. Pertinent negatives for diabetes include no blurred vision, no chest pain, no fatigue, no foot paresthesias, no foot ulcerations, no polydipsia, no polyphagia, no polyuria, no visual change, no weakness and no weight loss. Symptoms are stable. Her home blood glucose trend is increasing steadily. Her breakfast blood glucose is taken between 7-8 am. Her breakfast blood glucose range is generally 180-200 mg/dl.  Vaginal Itching  The patient's primary symptoms include genital itching and genital lesions. The patient's pertinent negatives include no genital odor, genital rash, missed menses, pelvic pain, vaginal bleeding or vaginal discharge. She is not pregnant. Pertinent negatives include no abdominal pain, anorexia, back pain, chills, constipation, fever, frequency, headaches, rash or sore throat.     Past Medical History:  Diagnosis Date  . Chest pain    a. 2D echo 01/2010 - mild TR, LVEF 60% with normal wall thickness, trace MR, mild TR, normal RV, trace pericardial effusion. b. Normal nuc 03/2011 at [facility].  . CHF (congestive heart failure) ([place])   . CKD (chronic kidney disease), stage III   . Diabetes mellitus   . Diabetic retinopathy ([place])   . GERD (gastroesophageal reflux disease)   . Heart murmur    as a child  . Hyperlipidemia   . Hypertension   . Hypoalbuminemia   . Normocytic anemia   . Pneumonia 07/04/2013   Social History   Social History  . Marital  status: Legally Separated    Spouse name: N/A  . Number of children: N/A  . 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 Smoking status: Never Smoker  . Smokeless tobacco: Never Used  . Alcohol use Yes     Comment: occasional drink of wine - 1 glass/week or less  . Drug use: No  . Sexual activity: Not on file   Other Topics Concern  . Not on file   Social History Narrative  . No narrative on file    Review of Systems  Constitutional: Negative.  Negative for chills, fatigue, fever and weight loss.  HENT: Negative.  Negative for sore throat.   Eyes: Negative.  Negative for blurred vision.  Respiratory: Negative.   Cardiovascular: Negative.  Negative for chest pain.  Gastrointestinal: Negative.  Negative for abdominal pain, anorexia and constipation.  Genitourinary: Negative.  Negative for frequency, missed menses, pelvic pain and vaginal discharge.  Musculoskeletal: Negative.  Negative for back pain.  Skin: Negative.  Negative for rash.  Neurological: Negative.  Negative for tremors, speech difficulty, weakness and headaches.  Endo/Heme/Allergies: Negative.  Negative for polydipsia and polyphagia.  Psychiatric/Behavioral: Negative.  Negative for confusion. The patient is not nervous/anxious.    Physical Exam  Constitutional: She is oriented to person, place, and time and well-developed, well-nourished, and in no distress.  HENT:  Head: Normocephalic and atraumatic.  Right Ear: External ear normal.  Left Ear: External ear normal.  Nose: Nose normal.  Mouth/Throat: Oropharynx is  clear and moist.  Eyes: Conjunctivae and EOM are normal. Pupils are equal, round, and reactive to light.  Neck: Normal range of motion. Neck supple.  Pulmonary/Chest: Effort normal and breath sounds normal.  Abdominal: Soft. Bowel sounds are normal.  Genitourinary: Vagina exhibits lesion (Fluid filled lesiion to left labia). Thin  and vaginal discharge found.  Musculoskeletal: Normal range of motion.  Neurological: She is alert and oriented to person, place, and time. Gait normal. GCS score is 15.  Skin: Skin is warm and dry.  Psychiatric: Mood, memory, affect and judgment normal.   Plan   1. Uncontrolled type 2 diabetes mellitus with hyperglycemia, with long-term current use of insulin (HCC) Hemoglobin a1c is 10.1, will increase Levemir to 20 units at bedtime.  Will continue Novalog per sliding scale.  The patient is asked to make an attempt to improve diet and exercise patterns to aid in medical management of this problem.  - HgB A1c - Insulin Syringe-Needle U-100 (INSULIN SYRINGE 1CC/31GX5/16") 31G X 5/16" 1 ML MISC; 1 each by Does not apply route 3 (three) times daily before meals.  Dispense: 100 each; Refill: 5 - Insulin Detemir (LEVEMIR FLEXPEN) 100 UNIT/ML Pen; Inject 20 Units into the skin daily at 10 pm.  Dispense: 15 mL; Refill: 11  2. Genital lesion, female - valACYclovir (VALTREX) 500 MG tablet; Take 1 tablet (500 mg total) by mouth daily.  Dispense: 30 tablet; Refill: 2  3. Vaginal itching - POCT Wet Prep [facility])  4. Insomnia, unspecified type - zolpidem (AMBIEN) 5 MG tablet; Take 1 tablet (5 mg total) by mouth at bedtime as needed for sleep.  Dispense: 30 tablet; Refill: 1  5. Bacterial vaginosis - metroNIDAZOLE (METROGEL) 0.75 % vaginal gel; Place 1 Applicatorful vaginally at bedtime.  Dispense: 70 g; Refill: 0   RTC: 1 month for DMII   Donia Pounds  MSN, [facility] [address] [place], [place] [address] [phone]

## 2016-06-13 NOTE — Patient Instructions (Addendum)
Diabetes mellitus:  Levemir 20  units at bedtime Continue sliding scale insulin Carbohydrate modified diet   HSV:   Will start Valtrex 500 mg daily   Insomnia:   Will continue Ambien 5 mg before bed Practice good sleep hygiene.  Stick to a sleep schedule, even on weekends. Exercise is great, but not too late in the day Avoid alcoholic drinks before bed Avoid large meals and beverages late before bed Don't take naps after 3 pm. Keep power naps less than 1 hour.  Relax before bed.  Take a hot bath before bed.  Have a good sleeping environment. Get rid of anything in your bedroom that might distract you from sleep.  Adopt good sleeping posture.   Vaginitis Vaginitis is an inflammation of the vagina. It can happen when the normal bacteria and yeast in the vagina grow too much. There are different types. Treatment will depend on the type you have. Follow these instructions at home:  Take all medicines as told by your doctor.  Keep your vagina area clean and dry. Avoid soap. Rinse the area with water.  Avoid washing and cleaning out the vagina (douching).  Do not use tampons or have sex (intercourse) until your treatment is done.  Wipe from front to back after going to the restroom.  Wear cotton underwear.  Avoid wearing underwear while you sleep until your vaginitis is gone.  Avoid tight pants. Avoid underwear or nylons without a cotton panel.  Take off wet clothing (such as a bathing suit) as soon as you can.  Use mild, unscented products. Avoid fabric softeners and scented:  Feminine sprays.  Laundry detergents.  Tampons.  Soaps or bubble baths.  Practice safe sex and use condoms. Get help right away if:  You have belly (abdominal) pain.  You have a fever or lasting symptoms for more than 2-3 days.  You have a fever and your symptoms suddenly get worse. This information is not intended to replace advice given to you by your health care provider. Make sure  you discuss any questions you have with your health care provider. Document Released: 04/15/2008 Document Revised: 06/25/2015 Document Reviewed: 06/30/2011 Elsevier Interactive Patient Education  2017 [employer].

## 2016-07-03 NOTE — Progress Notes (Signed)
Cardiology Office Note   Date:  07/04/2016   ID:  Jade Bond, DOB 09/25/1965, MRN [id number]  PCP:  Dorena Dew, FNP  Cardiologist:   Dorris Carnes, MD    Pt presents for f/u of chest pressure/chest pain     History of Present Illness: Jade Bond is a 51 y.o. female with a history of HTN, DM, HL,ESRD (on dialysis), anemia, dyspnea on exertion and LE edema  Cath in 2017 showed no signif CAD LVEF 75 to 80%   I saw her in Feb Added Crestor.   Pt is followed in renal clinic by [facility]  Undergoes dialysis 3x per wk Pt  Referred back to cardiology for CP    Pt says that after  dialysis she has had chest tightness  SHe has also  had during dialysis  Like elephant sitting on her chest   Also has some sharp stabing sensations   Pt notes that over past several months her dry weight was decreased about 6 lbs Will get light headed duriin dialysis. sessions    Current Meds  Medication Sig  . aspirin EC 81 MG tablet Take 81 mg by mouth daily.  . calcium acetate (PHOSLO) 667 MG capsule Take 1,334 mg by mouth 3 (three) times daily with meals.  Marland Kitchen glipiZIDE (GLUCOTROL) 10 MG tablet TAKE 1 TABLET BY MOUTH TWICE A DAY BEFORE A MEAL  . glucose blood (ACCU-CHEK AVIVA) test strip Check blood sugars prior to meals and at bedtime.  . insulin aspart (NOVOLOG) 100 UNIT/ML injection Inject 0-10 Units into the skin 3 (three) times daily before meals.  . Insulin Detemir (LEVEMIR FLEXPEN) 100 UNIT/ML Pen Inject 20 Units into the skin daily at 10 pm.  . Insulin Syringe-Needle U-100 (INSULIN SYRINGE 1CC/31GX5/16") 31G X 5/16" 1 ML MISC 1 each by Does not apply route 3 (three) times daily before meals.  . Lancets (ACCU-CHEK SOFT TOUCH) lancets Check blood sugar prior to meals and bedtime  . megestrol (MEGACE) 40 MG tablet Take 2 tablets (80 mg total) by mouth 2 (two) times daily.  . metoprolol succinate (TOPROL-XL) 100 MG 24 hr tablet Take 2 tablets (200 mg total) by mouth daily.  . multivitamin  (RENA-VIT) TABS tablet Take 1 tablet by mouth daily.  . nitroGLYCERIN (NITROSTAT) 0.4 MG SL tablet Place 0.4 mg under the tongue every 5 (five) minutes as needed for chest pain.  . rosuvastatin (CRESTOR) 10 MG tablet Take 1 tablet (10 mg total) by mouth daily.  . traMADol (ULTRAM) 50 MG tablet Take 1 tablet (50 mg total) by mouth every 6 (six) hours as needed for severe pain.  Marland Kitchen zolpidem (AMBIEN) 5 MG tablet Take 1 tablet (5 mg total) by mouth at bedtime as needed for sleep.     Allergies:   Other   Past Medical History:  Diagnosis Date  . Chest pain    a. 2D echo 01/2010 - mild TR, LVEF 60% with normal wall thickness, trace MR, mild TR, normal RV, trace pericardial effusion. b. Normal nuc 03/2011 at [facility].  . CHF (congestive heart failure) ([place])   . CKD (chronic kidney disease), stage III   . Diabetes mellitus   . Diabetic retinopathy ([place])   . GERD (gastroesophageal reflux disease)   . Heart murmur    as a child  . Hyperlipidemia   . Hypertension   . Hypoalbuminemia   . Normocytic anemia   . Pneumonia 07/04/2013    Past Surgical History:  Procedure Laterality Date  .  BASCILIC VEIN TRANSPOSITION Left 03/04/2015   Procedure: BASILIC VEIN TRANSPOSITION;  Surgeon: Elam Dutch, MD;  Location: [facility];  Service: Vascular;  Laterality: Left;  . BREAST SURGERY     lumpectomy  . CARDIAC CATHETERIZATION  ~ 2008   negative, no intervention needed  . CARDIAC CATHETERIZATION N/A 12/16/2015   Procedure: Left Heart Cath and Coronary Angiography;  Surgeon: Troy Sine, MD;  Location: [place] [facility];  Service: Cardiovascular;  Laterality: N/A;  . DILATION AND CURETTAGE OF UTERUS     x 3  . EYE SURGERY     eye injections  . EYE SURGERY     right eye surgery   . TONSILLECTOMY  1980  . TUBAL LIGATION  1995     Social History:  The patient  reports that she has never smoked. She has never used smokeless tobacco. She reports that she drinks alcohol. She reports that she does not use  drugs.   Family History:  The patient's family history includes Diabetes Mellitus II in her mother and sister; Heart attack in her father; Hypertension in her sister; Kidney disease in her father; Malignant hyperthermia in her mother.    ROS:  Please see the history of present illness. All other systems are reviewed and  Negative to the above problem except as noted.    PHYSICAL EXAM: VS:  BP 140/82   Pulse 84   Ht 5\' 7"  (1.702 m)   Wt 82.8 kg (182 lb 9.6 oz)   BMI 28.60 kg/m   GEN: Well nourished, well developed, in no acute distress  HEENT: normal  Neck: no JVD, carotid bruits, or masses Cardiac: RRR;III/vI systolic murmur LSB  , rubs, or gallops,no edema  Respiratory:  clear to auscultation bilaterally, normal work of breathing GI: soft, nontender, nondistended, + BS  No hepatomegaly  MS: no deformity Moving all extremities   Skin: warm and dry, no rash Neuro:  Strength and sensation are intact Psych: euthymic mood, full affect   EKG:  EKG is ordered today.    SR 84 bpm  Nonspecific ST changes    Septal infarct     Lipid Panel    Component Value Date/Time   CHOL 187 10/23/2015 1357   TRIG 136 10/23/2015 1357   HDL 35 (L) 10/23/2015 1357   CHOLHDL 5.3 (H) 10/23/2015 1357   VLDL 27 10/23/2015 1357   LDLCALC 125 10/23/2015 1357   LDLDIRECT 236.6 11/29/2013 1529      Wt Readings from Last 3 Encounters:  07/04/16 82.8 kg (182 lb 9.6 oz)  06/13/16 82.1 kg (181 lb)  03/14/16 81.2 kg (179 lb)      ASSESSMENT AND PLAN:  1  Chest tightness, pain pt has minimal coronary dz.  Symptoms are atypical Does have severe LVH and a hyperdynamic ventricle with mid cavity obliteration  ? If some of symtposm due to subendocardial ischemia   Her dry wt has been taken down over the past several months  I wonder if she is too dry from dialysis This would make the ventrical even small, drop BP and exacerbate symptoms    2  CAD  Minimal at cath   3  HL   4  ESRD  Dialysis  Tues/Thurs / Sat     Current medicines are reviewed at length with the patient today.  The patient does not have concerns regarding medicines.  Signed, Dorris Carnes, MD  07/04/2016 1:55 PM    [facility]  [address], [place], [place]  [address] Phone: [phone]; Fax: [phone]    

## 2016-07-04 ENCOUNTER — Ambulatory Visit (INDEPENDENT_AMBULATORY_CARE_PROVIDER_SITE_OTHER): Payer: Medicare Other | Admitting: Internal Medicine

## 2016-07-04 ENCOUNTER — Encounter: Payer: Self-pay | Admitting: Internal Medicine

## 2016-07-04 VITALS — BP 140/82 | HR 84 | Ht 67.0 in | Wt 182.6 lb

## 2016-07-04 DIAGNOSIS — I10 Essential (primary) hypertension: Secondary | ICD-10-CM | POA: Diagnosis not present

## 2016-07-04 DIAGNOSIS — N186 End stage renal disease: Secondary | ICD-10-CM | POA: Diagnosis not present

## 2016-07-04 DIAGNOSIS — R0789 Other chest pain: Secondary | ICD-10-CM | POA: Diagnosis not present

## 2016-07-04 NOTE — Patient Instructions (Signed)
Medication Instructions:  Your physician recommends that you continue on your current medications as directed. Please refer to the Current Medication list given to you today.   Labwork: None   Testing/Procedures: None   Follow-Up: Your physician recommends that you schedule a follow-up appointment as needed with Dr Harrington Challenger.      If you need a refill on your cardiac medications before your next appointment, please call your pharmacy.

## 2016-07-27 ENCOUNTER — Ambulatory Visit (INDEPENDENT_AMBULATORY_CARE_PROVIDER_SITE_OTHER): Payer: Medicare Other | Admitting: Family Medicine

## 2016-07-27 ENCOUNTER — Encounter: Payer: Self-pay | Admitting: Family Medicine

## 2016-07-27 VITALS — BP 112/80 | HR 87 | Temp 98.5°F | Resp 16 | Ht 67.0 in | Wt 180.0 lb

## 2016-07-27 DIAGNOSIS — G4700 Insomnia, unspecified: Secondary | ICD-10-CM | POA: Diagnosis not present

## 2016-07-27 DIAGNOSIS — Z794 Long term (current) use of insulin: Secondary | ICD-10-CM | POA: Diagnosis not present

## 2016-07-27 DIAGNOSIS — E1165 Type 2 diabetes mellitus with hyperglycemia: Secondary | ICD-10-CM

## 2016-07-27 LAB — POCT GLYCOSYLATED HEMOGLOBIN (HGB A1C): Hemoglobin A1C: 12.9

## 2016-07-27 LAB — GLUCOSE, CAPILLARY: Glucose-Capillary: 239 mg/dL — ABNORMAL HIGH (ref 65–99)

## 2016-07-27 MED ORDER — ZOLPIDEM TARTRATE 5 MG PO TABS: 5.0000 mg | ORAL_TABLET | Freq: Every evening | ORAL | 1 refills | Status: DC | PRN

## 2016-07-27 MED ORDER — INSULIN DETEMIR 100 UNIT/ML FLEXPEN: 30.0000 [IU] | PEN_INJECTOR | Freq: Every day | SUBCUTANEOUS | 11 refills | Status: DC

## 2016-07-27 NOTE — Patient Instructions (Addendum)
Blood sugars have improved at dialysis.  Monthly report has improved.  Hemoglobin a1C is increased on today. Blood sugars are consistently below 200. However, patient had to take prednisone prior to procedure which increased blood sugar.   Continue sliding scale Novolog.  Will increase Levemir to 30 units at bedtime

## 2016-07-27 NOTE — Progress Notes (Signed)
[name], a 51 year old patient with a history of diabetes mellitus, hypertension, and end stage renal disease presents for a follow up of uncontrolled type 2 diabetes mellitus.   She is currently undergoing dialysis on Monday, Wednesday, and Fridays. She says that glucose has improved and has been consistently less that 200 following meals.    Diabetes  She presents for her follow-up diabetic visit. She has type 2 diabetes mellitus. Pertinent negatives for hypoglycemia include no confusion, mood changes, nervousness/anxiousness, speech difficulty, sweats or tremors. Pertinent negatives for diabetes include no blurred vision, no chest pain, no fatigue, no foot paresthesias, no foot ulcerations, no polydipsia, no polyphagia, no polyuria, no visual change, no weakness and no weight loss. Symptoms are stable. Her home blood glucose trend is increasing steadily. Her breakfast blood glucose is taken between 7-8 am. Her breakfast blood glucose range is generally 180-200 mg/dl.     Past Medical History:  Diagnosis Date  . Chest pain    a. 2D echo 01/2010 - mild TR, LVEF 60% with normal wall thickness, trace MR, mild TR, normal RV, trace pericardial effusion. b. Normal nuc 03/2011 at [facility].  . CHF (congestive heart failure) ([place])   . CKD (chronic kidney disease), stage III   . Diabetes mellitus   . Diabetic retinopathy ([place])   . GERD (gastroesophageal reflux disease)   . Heart murmur    as a child  . Hyperlipidemia   . Hypertension   . Hypoalbuminemia   . Normocytic anemia   . Pneumonia 07/04/2013   Social History   Social History  . Marital status: Legally Separated    Spouse name: N/A  . Number of children: N/A  . 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 Smoking status: Never Smoker  . Smokeless tobacco: Never Used  . Alcohol use Yes     Comment: occasional drink of wine - 1 glass/week or less  . Drug use:  No  . Sexual activity: Not on file   Other Topics Concern  . Not on file   Social History Narrative  . No narrative on file    Review of Systems  Constitutional: Negative.  Negative for fatigue and weight loss.  HENT: Negative.   Eyes: Negative.  Negative for blurred vision.  Respiratory: Negative.   Cardiovascular: Negative.  Negative for chest pain.  Gastrointestinal: Negative.   Genitourinary: Negative.   Musculoskeletal: Negative.   Skin: Negative.   Neurological: Negative.  Negative for tremors, speech difficulty and weakness.  Endo/Heme/Allergies: Negative.  Negative for polydipsia and polyphagia.  Psychiatric/Behavioral: Negative.  Negative for confusion. The patient is not nervous/anxious.    Physical Exam  Constitutional: She is oriented to person, place, and time and well-developed, well-nourished, and in no distress.  HENT:  Head: Normocephalic and atraumatic.  Right Ear: External ear normal.  Left Ear: External ear normal.  Nose: Nose normal.  Mouth/Throat: Oropharynx is clear and moist.  Eyes: Conjunctivae and EOM are normal. Pupils are equal, round, and reactive to light.  Neck: Normal range of motion. Neck supple.  Pulmonary/Chest: Effort normal and breath sounds normal.  Abdominal: Soft. Bowel sounds are normal.  Musculoskeletal: Normal range of motion.  Neurological: She is alert and oriented to person, place, and time. Gait normal. GCS score is 15.  Skin: Skin is warm and dry.  Psychiatric: Mood, memory, affect and judgment normal.  BP 112/80 (BP Location: Right Arm, Patient Position: Sitting, Cuff Size: Normal)   Pulse 87   Temp 98.5 F (36.9 C) (Oral)   Resp 16   Ht 5\' 7"  (1.702 m)   Wt 180 lb (81.6 kg)   LMP 07/05/2016   SpO2 100%   BMI 28.19 kg/m   Plan   1. Uncontrolled type 2 diabetes mellitus with hyperglycemia, with long-term current use of insulin (HCC) Hemoglobin a1c is increased at 12.8., will increase Levemir to 30 units at bedtime.   Will continue Novalog per sliding scale.  The patient is asked to make an attempt to improve diet and exercise patterns to aid in medical management of this problem. Bring glucometer to 1 month follow up.  - HgB A1c - Insulin Detemir (LEVEMIR FLEXPEN) 100 UNIT/ML Pen; Inject 30 Units into the skin daily at 10 pm.  Dispense: 15 mL; Refill: 11  2. Insomnia, unspecified type Discussed sleep hygiene at length. She says that periods of insomnia hais improved with Ambien.  Practice good sleep hygiene.  Stick to a sleep schedule, even on weekends. Exercise is great, but not too late in the day Avoid alcoholic drinks before bed Avoid large meals and beverages late before bed Don't take naps after 3 pm. Keep power naps less than 1 hour.  Relax before bed.  Take a hot bath before bed.  Have a good sleeping environment. Get rid of anything in your bedroom that might distract you from sleep.  Adopt good sleeping posture.   - zolpidem (AMBIEN) 5 MG tablet; Take 1 tablet (5 mg total) by mouth at bedtime as needed for sleep.  Dispense: 30 tablet; Refill: 1   RTC: 1 month for uncontrolled diabetes    Donia Pounds  MSN, FNP-C [facility] [address] [place], [place] [address] [phone]

## 2016-07-28 ENCOUNTER — Emergency Department (HOSPITAL_COMMUNITY)
Admission: EM | Admit: 2016-07-28 | Discharge: 2016-07-28 | Disposition: A | Discharge status: Home / Self Care | Payer: Medicare Other | Attending: Emergency Medicine | Admitting: Emergency Medicine

## 2016-07-28 ENCOUNTER — Encounter (HOSPITAL_COMMUNITY): Payer: Self-pay | Admitting: *Deleted

## 2016-07-28 ENCOUNTER — Emergency Department (HOSPITAL_COMMUNITY): Payer: Medicare Other

## 2016-07-28 DIAGNOSIS — E11319 Type 2 diabetes mellitus with unspecified diabetic retinopathy without macular edema: Secondary | ICD-10-CM | POA: Insufficient documentation

## 2016-07-28 DIAGNOSIS — E1122 Type 2 diabetes mellitus with diabetic chronic kidney disease: Secondary | ICD-10-CM | POA: Insufficient documentation

## 2016-07-28 DIAGNOSIS — I509 Heart failure, unspecified: Secondary | ICD-10-CM | POA: Insufficient documentation

## 2016-07-28 DIAGNOSIS — I132 Hypertensive heart and chronic kidney disease with heart failure and with stage 5 chronic kidney disease, or end stage renal disease: Secondary | ICD-10-CM | POA: Insufficient documentation

## 2016-07-28 DIAGNOSIS — Z79899 Other long term (current) drug therapy: Secondary | ICD-10-CM | POA: Diagnosis not present

## 2016-07-28 DIAGNOSIS — R0789 Other chest pain: Secondary | ICD-10-CM | POA: Diagnosis not present

## 2016-07-28 DIAGNOSIS — Z7982 Long term (current) use of aspirin: Secondary | ICD-10-CM | POA: Diagnosis not present

## 2016-07-28 DIAGNOSIS — Z992 Dependence on renal dialysis: Secondary | ICD-10-CM | POA: Insufficient documentation

## 2016-07-28 DIAGNOSIS — N185 Chronic kidney disease, stage 5: Secondary | ICD-10-CM | POA: Diagnosis not present

## 2016-07-28 DIAGNOSIS — Z794 Long term (current) use of insulin: Secondary | ICD-10-CM | POA: Diagnosis not present

## 2016-07-28 DIAGNOSIS — R079 Chest pain, unspecified: Secondary | ICD-10-CM

## 2016-07-28 LAB — BASIC METABOLIC PANEL
ANION GAP: 15 (ref 5–15)
BUN: 17 mg/dL (ref 6–20)
CALCIUM: 8.9 mg/dL (ref 8.9–10.3)
CHLORIDE: 92 mmol/L — AB (ref 101–111)
CO2: 28 mmol/L (ref 22–32)
Creatinine, Ser: 4.62 mg/dL — ABNORMAL HIGH (ref 0.44–1.00)
GFR calc non Af Amer: 10 mL/min — ABNORMAL LOW (ref >60)
GFR, EST AFRICAN AMERICAN: 12 mL/min — AB (ref >60)
Glucose, Bld: 392 mg/dL — ABNORMAL HIGH (ref 65–99)
Potassium: 3.3 mmol/L — ABNORMAL LOW (ref 3.5–5.1)
SODIUM: 135 mmol/L (ref 135–145)

## 2016-07-28 LAB — CBC WITH DIFFERENTIAL/PLATELET
BASOS PCT: 0 %
Basophils Absolute: 0 10*3/uL (ref 0.0–0.1)
EOS ABS: 0.1 10*3/uL (ref 0.0–0.7)
Eosinophils Relative: 1 %
HEMATOCRIT: 32.8 % — AB (ref 36.0–46.0)
HEMOGLOBIN: 11.8 g/dL — AB (ref 12.0–15.0)
Lymphocytes Relative: 41 %
Lymphs Abs: 2.3 10*3/uL (ref 0.7–4.0)
MCH: 32.5 pg (ref 26.0–34.0)
MCHC: 36 g/dL (ref 30.0–36.0)
MCV: 90.4 fL (ref 78.0–100.0)
MONOS PCT: 6 %
Monocytes Absolute: 0.3 10*3/uL (ref 0.1–1.0)
NEUTROS ABS: 2.9 10*3/uL (ref 1.7–7.7)
Neutrophils Relative %: 52 %
Platelets: 234 10*3/uL (ref 150–400)
RBC: 3.63 MIL/uL — AB (ref 3.87–5.11)
RDW: 13.7 % (ref 11.5–15.5)
WBC: 5.7 10*3/uL (ref 4.0–10.5)

## 2016-07-28 LAB — I-STAT TROPONIN, ED
Troponin i, poc: 0 ng/mL (ref 0.00–0.08)
Troponin i, poc: 0.05 ng/mL (ref 0.00–0.08)

## 2016-07-28 MED ORDER — METOPROLOL TARTRATE 5 MG/5ML IV SOLN
5.0000 mg | Freq: Once | INTRAVENOUS | Status: AC
  Administered 2016-07-28: 5 mg via INTRAVENOUS
  Filled 2016-07-28: qty 5

## 2016-07-28 MED ORDER — ASPIRIN 81 MG PO CHEW
324.0000 mg | CHEWABLE_TABLET | Freq: Once | ORAL | Status: AC
  Administered 2016-07-28: 324 mg via ORAL
  Filled 2016-07-28: qty 4

## 2016-07-28 NOTE — ED Provider Notes (Signed)
[facility] DEPT Provider Note   CSN: [id number] Arrival date & time: 07/28/16  1627     History   Chief Complaint Chief Complaint  Patient presents with  . Chest Pain    HPI Jade Bond is a 51 y.o. female.  The history is provided by the patient. No language interpreter was used.  Chest Pain     Jade Bond is a 51 y.o. female who presents to the Emergency Department complaining of chest pain. She has ESRD and is on hemodialysis. Today she was receiving her dialysis when she developed some left sided chest discomfort towards the end of her session. Right at the end of her session she did have hypotension with a blood pressure in the 70s. Her blood pressure improved to 90s and she was getting ready to leave when her chest pain worsened. The pain is a heavy pressure on the left side of her chest. Symptoms started between 3:30 and 4 this afternoon. She has associated shortness of breath. No diaphoresis, nausea, vomiting, abdominal pain, leg swelling. She has persistent chest pain at this time but is improved compared to earlier. 3 weeks ago she had similar symptoms and was admitted to the hospital. She had her dry weight increased after that episode and her symptoms had resolved only to recur today. Past Medical History:  Diagnosis Date  . Chest pain    a. 2D echo 01/2010 - mild TR, LVEF 60% with normal wall thickness, trace MR, mild TR, normal RV, trace pericardial effusion. b. Normal nuc 03/2011 at [facility].  . CHF (congestive heart failure) ([place])   . CKD (chronic kidney disease), stage III   . Diabetes mellitus   . Diabetic retinopathy ([place])   . GERD (gastroesophageal reflux disease)   . Heart murmur    as a child  . Hyperlipidemia   . Hypertension   . Hypoalbuminemia   . Normocytic anemia   . Pneumonia 07/04/2013    Patient Active Problem List   Diagnosis Date Noted  . Preoperative clearance 12/14/2015  . Chest pain 11/10/2015  . CKD (chronic kidney disease) requiring  chronic dialysis ([place]) 11/10/2015  . Fibroids 04/27/2015  . Abnormal uterine bleeding (AUB) 04/16/2015  . Hypoalbuminemia   . Normocytic anemia 08/07/2013  . CAP (community acquired pneumonia) 07/04/2013  . Angina pectoris ([place]) 06/17/2013  . Bilateral lower extremity edema 06/03/2013  . Heart murmur 02/21/2013  . 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 Diabetes mellitus type 2, uncontrolled ([place]) 03/13/2012  . Allergic rhinitis 11/24/2011  . CKD (chronic kidney disease) stage 5, GFR less than 15 ml/min ([facility]) 05/17/2011  . Hyperlipidemia 04/01/2011  . HTN (hypertension) 03/18/2011    Past Surgical History:  Procedure Laterality Date  . BASCILIC VEIN TRANSPOSITION Left 03/04/2015   Procedure: BASILIC VEIN TRANSPOSITION;  Surgeon: Elam Dutch, MD;  Location: [facility];  Service: Vascular;  Laterality: Left;  . BREAST SURGERY     lumpectomy  . CARDIAC CATHETERIZATION  ~ 2008   negative, no intervention needed  . CARDIAC CATHETERIZATION N/A 12/16/2015   Procedure: Left Heart Cath and Coronary Angiography;  Surgeon: Troy Sine, MD;  Location: [facility];  Service: Cardiovascular;  Laterality: N/A;  . DILATION AND CURETTAGE OF UTERUS     x 3  . EYE SURGERY     eye injections  . EYE SURGERY     right eye surgery   . TONSILLECTOMY  1980  . [facility]  OB History    Gravida Para Term Preterm AB Living   6 3 3  0 3 3   SAB TAB Ectopic Multiple Live Births   0 3 0 0 3       Home Medications    Prior to Admission medications   Medication Sig Start Date End Date Taking? Authorizing Provider  aspirin EC 81 MG tablet Take 81 mg by mouth daily.   Yes [provider]  calcium acetate (PHOSLO) 667 MG capsule Take 2,001 mg by mouth 3 (three) times daily with meals.    Yes [provider]  glipiZIDE (GLUCOTROL) 10 MG tablet TAKE 1 TABLET BY MOUTH TWICE A DAY BEFORE A MEAL 05/30/16  Yes Dorena Dew, FNP  insulin aspart (NOVOLOG) 100 UNIT/ML injection Inject 0-10 Units into the skin 3 (three) times daily before meals. 02/10/16  Yes Dorena Dew, FNP  Insulin Detemir (LEVEMIR FLEXPEN) 100 UNIT/ML Pen Inject 30 Units into the skin daily at 10 pm. 07/27/16  Yes Dorena Dew, FNP  megestrol (MEGACE) 40 MG tablet Take 2 tablets (80 mg total) by mouth 2 (two) times daily. 01/01/16  Yes Anyanwu, Sallyanne Havers, MD  metoprolol succinate (TOPROL-XL) 100 MG 24 hr tablet Take 2 tablets (200 mg total) by mouth daily. 11/16/15  Yes Micheline Chapman, NP  multivitamin (RENA-VIT) TABS tablet Take 1 tablet by mouth daily.   Yes [provider]  nitroGLYCERIN (NITROSTAT) 0.4 MG SL tablet Place 0.4 mg under the tongue every 5 (five) minutes as needed for chest pain.   Yes [provider]  zolpidem (AMBIEN) 5 MG tablet Take 1 tablet (5 mg total) by mouth at bedtime as needed for sleep. 07/27/16  Yes Dorena Dew, FNP  glucose blood (ACCU-CHEK AVIVA) test strip Check blood sugars prior to meals and at bedtime. 02/10/16   Dorena Dew, FNP  Insulin Syringe-Needle U-100 (INSULIN SYRINGE 1CC/31GX5/16") 31G X 5/16" 1 ML MISC 1 each by Does not apply route 3 (three) times daily before meals. 06/13/16   Dorena Dew, FNP  Lancets (ACCU-CHEK SOFT TOUCH) lancets Check blood sugar prior to meals and bedtime 02/11/16   Dorena Dew, FNP  rosuvastatin (CRESTOR) 10 MG tablet Take 1 tablet (10 mg total) by mouth daily. 02/12/16 07/04/16  Fay Records, MD  traMADol (ULTRAM) 50 MG tablet Take 1 tablet (50 mg total) by mouth every 6 (six) hours as needed for severe pain. 01/01/16   Osborne Oman, MD    Family History Family History  Problem Relation Age of Onset  . Malignant hyperthermia Mother   . Diabetes Mellitus II Mother   . Heart attack Father        Age 43  . Kidney disease Father   . Diabetes Mellitus II Sister   . Hypertension Sister     Social History Social  History  Substance Use Topics  . Smoking status: Never Smoker  . Smokeless tobacco: Never Used  . Alcohol use Yes     Comment: occasional drink of wine - 1 glass/week or less     Allergies   Other   Review of Systems Review of Systems  Cardiovascular: Positive for chest pain.  All other systems reviewed and are negative.    Physical Exam Updated Vital Signs BP 133/64 (BP Location: Right Arm)   Pulse 93   Temp 98.9 F (37.2 C) (Oral)   Resp 17   Ht 5\' 7"  (1.702 m)  Wt 82.1 kg (181 lb)   LMP 07/05/2016   SpO2 100%   BMI 28.35 kg/m   Physical Exam  Constitutional: She is oriented to person, place, and time. She appears well-developed and well-nourished. She appears distressed.  HENT:  Head: Normocephalic and atraumatic.  Cardiovascular:  Murmur heard. Tachycardic, systolic ejection murmur  Pulmonary/Chest: Effort normal and breath sounds normal. No respiratory distress.  Abdominal: Soft. There is no tenderness. There is no rebound and no guarding.  Musculoskeletal: She exhibits no edema or tenderness.  Fistula in the left upper extremity with dressing in place.  Neurological: She is alert and oriented to person, place, and time.  Skin: Skin is warm and dry.  Psychiatric: She has a normal mood and affect. Her behavior is normal.  Nursing note and vitals reviewed.    ED Treatments / Results  Labs (all labs ordered are listed, but only abnormal results are displayed) Labs Reviewed  BASIC METABOLIC PANEL - Abnormal; Notable for the following:       Result Value   Potassium 3.3 (*)    Chloride 92 (*)    Glucose, Bld 392 (*)    Creatinine, Ser 4.62 (*)    GFR calc non Af Amer 10 (*)    GFR calc Af Amer 12 (*)    All other components within normal limits  CBC WITH DIFFERENTIAL/PLATELET - Abnormal; Notable for the following:    RBC 3.63 (*)    Hemoglobin 11.8 (*)    HCT 32.8 (*)    All other components within normal limits  I-STAT TROPOININ, ED  I-STAT  TROPOININ, ED    EKG  EKG Interpretation  Date/Time:  Thursday July 28 2016 16:45:25 EDT Ventricular Rate:  130 PR Interval:    QRS Duration: 79 QT Interval:  335 QTC Calculation: 493 R Axis:   31 Text Interpretation:  Sinus tachycardia Consider left ventricular hypertrophy Anterior Q waves, possibly due to LVH Baseline wander in lead(s) II Confirmed by Hazle Coca [phone]) on 07/28/2016 5:00:56 PM       Radiology Dg Chest Port 1 View  Result Date: 07/28/2016 CLINICAL DATA:  Chest pain, dialysis patient, began having severe LEFT side chest pain and shortness of breath following dialysis today, history diabetes mellitus, hypertension, CHF, GERD EXAM: PORTABLE CHEST 1 VIEW COMPARISON:  Portable exam 1717 hours compared to 11/10/2015 FINDINGS: Upper normal heart size. Mediastinal contours and pulmonary vascularity normal. Lungs clear. No pleural effusion or pneumothorax. Bones unremarkable. IMPRESSION: No acute abnormalities. Electronically Signed   By: Lavonia Dana M.D.   On: 07/28/2016 17:44    Procedures Procedures (including critical care time)  Medications Ordered in ED Medications  aspirin chewable tablet 324 mg (324 mg Oral Given 07/28/16 1704)  metoprolol tartrate (LOPRESSOR) injection 5 mg (5 mg Intravenous Given 07/28/16 2113)     Initial Impression / Assessment and Plan / ED Course  I have reviewed the triage vital signs and the nursing notes.  Pertinent labs & imaging results that were available during my care of the patient were reviewed by me and considered in my medical decision making (see chart for details).     Patient with ESRD on hemodialysis here for evaluation of chest pain that occurred after dialysis. Her chest pain is resolving on ED arrival. She was initially markedly tachycardic to the 130s and this resolved with rest. EKG without acute ischemic changes and troponin 2 is negative. Discussed with Dr. Meda Coffee with cardiology, recommended dose of metoprolol in  the  ED to decrease heart rate and if her pain continues to be resolved plan to DC home with outpatient follow-up. Current clinical picture is not consistent with ACS, dissection, PE, tamponade.  Final Clinical Impressions(s) / ED Diagnoses   Final diagnoses:  Chest pain, unspecified type    New Prescriptions Discharge Medication List as of 07/28/2016 11:01 PM       Quintella Reichert, MD 07/29/16 0008

## 2016-07-28 NOTE — ED Triage Notes (Signed)
Patient is alert and oriented x4.  She is being seen for chest pain.  Patient states that she is a dialysis patient and that as soon as she left the machine today that she started having severe left sided chest pain.  Currently she rates her chest pain 10 of 10.

## 2016-07-28 NOTE — Discharge Instructions (Signed)
Contact your Nephrologist regarding your chest pain today.  They may need to adjust how much fluid they take off during dialysis.  Get rechecked immediately if you develop any new or worrisome symptoms.

## 2016-07-28 NOTE — ED Notes (Addendum)
PIV to rt [facility] discontinued at this time. Catheter intact, dressing applied and site WNL.

## 2016-08-16 ENCOUNTER — Encounter: Payer: Self-pay | Admitting: Internal Medicine

## 2016-08-18 ENCOUNTER — Other Ambulatory Visit: Payer: Self-pay | Admitting: Family Medicine

## 2016-08-18 ENCOUNTER — Telehealth: Payer: Self-pay

## 2016-08-18 MED ORDER — METOPROLOL SUCCINATE ER 100 MG PO TB24: 200.0000 mg | ORAL_TABLET | Freq: Every day | ORAL | 3 refills | Status: AC

## 2016-08-18 NOTE — Progress Notes (Signed)
Meds ordered this encounter  Medications  . metoprolol succinate (TOPROL-XL) 100 MG 24 hr tablet    Sig: Take 2 tablets (200 mg total) by mouth daily.    Dispense:  60 tablet    Refill:  [place]  MSN, FNP-C [facility] [address] [place], [place] [address] [phone]

## 2016-09-12 ENCOUNTER — Other Ambulatory Visit: Payer: Self-pay | Admitting: Family Medicine

## 2016-09-12 DIAGNOSIS — N949 Unspecified condition associated with female genital organs and menstrual cycle: Secondary | ICD-10-CM

## 2016-09-14 ENCOUNTER — Ambulatory Visit: Payer: Medicare Other | Admitting: Family Medicine

## 2016-09-23 ENCOUNTER — Ambulatory Visit (INDEPENDENT_AMBULATORY_CARE_PROVIDER_SITE_OTHER): Payer: Medicare Other | Admitting: Family Medicine

## 2016-09-23 ENCOUNTER — Encounter: Payer: Self-pay | Admitting: Family Medicine

## 2016-09-23 VITALS — BP 142/69 | HR 86 | Temp 98.7°F | Resp 16 | Ht 67.0 in | Wt 186.0 lb

## 2016-09-23 DIAGNOSIS — Z794 Long term (current) use of insulin: Secondary | ICD-10-CM

## 2016-09-23 DIAGNOSIS — R0981 Nasal congestion: Secondary | ICD-10-CM | POA: Diagnosis not present

## 2016-09-23 DIAGNOSIS — G4700 Insomnia, unspecified: Secondary | ICD-10-CM | POA: Diagnosis not present

## 2016-09-23 DIAGNOSIS — E1165 Type 2 diabetes mellitus with hyperglycemia: Secondary | ICD-10-CM | POA: Diagnosis not present

## 2016-09-23 DIAGNOSIS — J3089 Other allergic rhinitis: Secondary | ICD-10-CM

## 2016-09-23 LAB — POCT URINALYSIS DIP (DEVICE)
Bilirubin Urine: NEGATIVE
GLUCOSE, UA: 500 mg/dL — AB
LEUKOCYTES UA: NEGATIVE
Nitrite: NEGATIVE
Protein, ur: >=300 mg/dL — AB
SPECIFIC GRAVITY, URINE: 1.02 (ref 1.005–1.030)
Urobilinogen, UA: 0.2 mg/dL (ref 0.0–1.0)
pH: 6 (ref 5.0–8.0)

## 2016-09-23 LAB — POCT GLYCOSYLATED HEMOGLOBIN (HGB A1C): Hemoglobin A1C: 9

## 2016-09-23 LAB — GLUCOSE, CAPILLARY: Glucose-Capillary: 302 mg/dL — ABNORMAL HIGH (ref 65–99)

## 2016-09-23 MED ORDER — ZOLPIDEM TARTRATE 5 MG PO TABS: 5.0000 mg | ORAL_TABLET | Freq: Every evening | ORAL | 1 refills | Status: AC | PRN

## 2016-09-23 MED ORDER — FLUTICASONE PROPIONATE 50 MCG/ACT NA SUSP: 2.0000 | Freq: Every day | NASAL | 0 refills | Status: AC

## 2016-09-23 MED ORDER — CETIRIZINE HCL 10 MG PO TABS: 10.0000 mg | ORAL_TABLET | Freq: Every day | ORAL | 1 refills | Status: DC

## 2016-09-23 NOTE — Patient Instructions (Signed)
Hemoglobin a1C has improved to 9.0. No medication changes on today.  I have reviewed your labs from dialysis, no labs are needed.    Diabetes and Foot Care Diabetes may cause you to have problems because of poor blood supply (circulation) to your feet and legs. This may cause the skin on your feet to become thinner, break easier, and heal more slowly. Your skin may become dry, and the skin may peel and crack. You may also have nerve damage in your legs and feet causing decreased feeling in them. You may not notice minor injuries to your feet that could lead to infections or more serious problems. Taking care of your feet is one of the most important things you can do for yourself. Follow these instructions at home:  Wear shoes at all times, even in the house. Do not go barefoot. Bare feet are easily injured.  Check your feet daily for blisters, cuts, and redness. If you cannot see the bottom of your feet, use a mirror or ask someone for help.  Wash your feet with warm water (do not use hot water) and mild soap. Then pat your feet and the areas between your toes until they are completely dry. Do not soak your feet as this can dry your skin.  Apply a moisturizing lotion or petroleum jelly (that does not contain alcohol and is unscented) to the skin on your feet and to dry, brittle toenails. Do not apply lotion between your toes.  Trim your toenails straight across. Do not dig under them or around the cuticle. File the edges of your nails with an emery board or nail file.  Do not cut corns or calluses or try to remove them with medicine.  Wear clean socks or stockings every day. Make sure they are not too tight. Do not wear knee-high stockings since they may decrease blood flow to your legs.  Wear shoes that fit properly and have enough cushioning. To break in new shoes, wear them for just a few hours a day. This prevents you from injuring your feet. Always look in your shoes before you put them  on to be sure there are no objects inside.  Do not cross your legs. This may decrease the blood flow to your feet.  If you find a minor scrape, cut, or break in the skin on your feet, keep it and the skin around it clean and dry. These areas may be cleansed with mild soap and water. Do not cleanse the area with peroxide, alcohol, or iodine.  When you remove an adhesive bandage, be sure not to damage the skin around it.  If you have a wound, look at it several times a day to make sure it is healing.  Do not use heating pads or hot water bottles. They may burn your skin. If you have lost feeling in your feet or legs, you may not know it is happening until it is too late.  Make sure your health care provider performs a complete foot exam at least annually or more often if you have foot problems. Report any cuts, sores, or bruises to your health care provider immediately. Contact a health care provider if:  You have an injury that is not healing.  You have cuts or breaks in the skin.  You have an ingrown nail.  You notice redness on your legs or feet.  You feel burning or tingling in your legs or feet.  You have pain or cramps in  your legs and feet.  Your legs or feet are numb.  Your feet always feel cold. Get help right away if:  There is increasing redness, swelling, or pain in or around a wound.  There is a red line that goes up your leg.  Pus is coming from a wound.  You develop a fever or as directed by your health care provider.  You notice a bad smell coming from an ulcer or wound. This information is not intended to replace advice given to you by your health care provider. Make sure you discuss any questions you have with your health care provider. Document Released: 01/15/2000 Document Revised: 06/25/2015 Document Reviewed: 06/26/2012 Elsevier Interactive Patient Education  2017 [place] for Diabetes Mellitus, Adult Carbohydrate counting  is a method for keeping track of how many carbohydrates you eat. Eating carbohydrates naturally increases the amount of sugar (glucose) in the blood. Counting how many carbohydrates you eat helps keep your blood glucose within normal limits, which helps you manage your diabetes (diabetes mellitus). It is important to know how many carbohydrates you can safely have in each meal. This is different for every person. A diet and nutrition specialist (registered dietitian) can help you make a meal plan and calculate how many carbohydrates you should have at each meal and snack. Carbohydrates are found in the following foods:  Grains, such as breads and cereals.  Dried beans and soy products.  Starchy vegetables, such as potatoes, peas, and corn.  Fruit and fruit juices.  Milk and yogurt.  Sweets and snack foods, such as cake, cookies, candy, chips, and soft drinks.  How do I count carbohydrates? There are two ways to count carbohydrates in food. You can use either of the methods or a combination of both. Reading "Nutrition Facts" on packaged food The "Nutrition Facts" list is included on the labels of almost all packaged foods and beverages in the [place] It includes:  The serving size.  Information about nutrients in each serving, including the grams (g) of carbohydrate per serving.  To use the "Nutrition Facts":  Decide how many servings you will have.  Multiply the number of servings by the number of carbohydrates per serving.  The resulting number is the total amount of carbohydrates that you will be having.  Learning standard serving sizes of other foods When you eat foods containing carbohydrates that are not packaged or do not include "Nutrition Facts" on the label, you need to measure the servings in order to count the amount of carbohydrates:  Measure the foods that you will eat with a food scale or measuring cup, if needed.  Decide how many standard-size servings you will  eat.  Multiply the number of servings by 15. Most carbohydrate-rich foods have about 15 g of carbohydrates per serving. ? For example, if you eat 8 oz (170 g) of strawberries, you will have eaten 2 servings and 30 g of carbohydrates (2 servings x 15 g = 30 g).  For foods that have more than one food mixed, such as soups and casseroles, you must count the carbohydrates in each food that is included.  The following list contains standard serving sizes of common carbohydrate-rich foods. Each of these servings has about 15 g of carbohydrates:   hamburger bun or  English muffin.   oz (15 mL) syrup.   oz (14 g) jelly.  1 slice of bread.  1 six-inch tortilla.  3 oz (85 g) cooked rice or pasta.  4 oz (113 g) cooked dried beans.  4 oz (113 g) starchy vegetable, such as peas, corn, or potatoes.  4 oz (113 g) hot cereal.  4 oz (113 g) mashed potatoes or  of a large baked potato.  4 oz (113 g) canned or frozen fruit.  4 oz (120 mL) fruit juice.  4-6 crackers.  6 chicken nuggets.  6 oz (170 g) unsweetened dry cereal.  6 oz (170 g) plain fat-free yogurt or yogurt sweetened with artificial sweeteners.  8 oz (240 mL) milk.  8 oz (170 g) fresh fruit or one small piece of fruit.  24 oz (680 g) popped popcorn.  Example of carbohydrate counting Sample meal  3 oz (85 g) chicken breast.  6 oz (170 g) brown rice.  4 oz (113 g) corn.  8 oz (240 mL) milk.  8 oz (170 g) strawberries with sugar-free whipped topping. Carbohydrate calculation 1. Identify the foods that contain carbohydrates: ? Rice. ? Corn. ? Milk. ? Strawberries. 2. Calculate how many servings you have of each food: ? 2 servings rice. ? 1 serving corn. ? 1 serving milk. ? 1 serving strawberries. 3. Multiply each number of servings by 15 g: ? 2 servings rice x 15 g = 30 g. ? 1 serving corn x 15 g = 15 g. ? 1 serving milk x 15 g = 15 g. ? 1 serving strawberries x 15 g = 15 g. 4. Add together all of  the amounts to find the total grams of carbohydrates eaten: ? 30 g + 15 g + 15 g + 15 g = 75 g of carbohydrates total. This information is not intended to replace advice given to you by your health care provider. Make sure you discuss any questions you have with your health care provider. Document Released: 01/17/2005 Document Revised: 08/07/2015 Document Reviewed: 07/01/2015 Elsevier Interactive Patient Education  [employer].

## 2016-09-23 NOTE — Progress Notes (Signed)
[name], a 51 year old patient with a history of diabetes mellitus, hypertension, and end stage renal disease presents for a follow up of uncontrolled type 2 diabetes mellitus.   She is currently undergoing dialysis on Monday, Wednesday, and Friday at [facility]. Reviewed laboratory values from dialysis. She says that daily BG has improved. Blood sugar was markedly elevated on arrival. She states that she had a muffin prior to arrival. She denies polydipsia, polyphagia, or nocturia.     Diabetes  She presents for her follow-up diabetic visit. She has type 2 diabetes mellitus. Pertinent negatives for hypoglycemia include no confusion, mood changes, nervousness/anxiousness, speech difficulty, sweats or tremors. Pertinent negatives for diabetes include no blurred vision, no chest pain, no fatigue, no foot paresthesias, no foot ulcerations, no polydipsia, no polyphagia, no polyuria, no visual change, no weakness and no weight loss. Symptoms are stable. She has had a previous visit with a dietitian. She never participates in exercise. Her home blood glucose trend is decreasing steadily. Her breakfast blood glucose is taken between 7-8 am. Her breakfast blood glucose range is generally 180-200 mg/dl. An ACE inhibitor/angiotensin II receptor blocker is contraindicated. She does not see a podiatrist.Eye exam is current.  Hypertension  Pertinent negatives include no blurred vision, chest pain or sweats. Risk factors for coronary artery disease include diabetes mellitus and obesity.     Past Medical History:  Diagnosis Date  . Chest pain    a. 2D echo 01/2010 - mild TR, LVEF 60% with normal wall thickness, trace MR, mild TR, normal RV, trace pericardial effusion. b. Normal nuc 03/2011 at [facility].  . CHF (congestive heart failure) ([place])   . CKD (chronic kidney disease), stage III   . Diabetes mellitus   . Diabetic retinopathy ([place])   . GERD (gastroesophageal reflux disease)   . Heart murmur     as a child  . Hyperlipidemia   . Hypertension   . Hypoalbuminemia   . Normocytic anemia   . Pneumonia 07/04/2013   Social History   Social History  . Marital status: Legally Separated    Spouse name: N/A  . Number of children: N/A  . 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 Smoking status: Never Smoker  . Smokeless tobacco: Never Used  . Alcohol use Yes     Comment: occasional drink of wine - 1 glass/week or less  . Drug use: No  . Sexual activity: Not on file   Other Topics Concern  . Not on file   Social History Narrative  . No narrative on file    Review of Systems  Constitutional: Negative.  Negative for fatigue and weight loss.  HENT: Negative.   Eyes: Negative.  Negative for blurred vision.  Respiratory: Negative.   Cardiovascular: Negative.  Negative for chest pain.  Gastrointestinal: Negative.   Genitourinary: Negative.   Musculoskeletal: Negative.   Skin: Negative.   Neurological: Negative.  Negative for tremors, speech difficulty and weakness.  Endo/Heme/Allergies: Negative.  Negative for polydipsia and polyphagia.  Psychiatric/Behavioral: Negative.  Negative for confusion. The patient is not nervous/anxious.    Physical Exam  Constitutional: She is oriented to person, place, and time and well-developed, well-nourished, and in no distress.  HENT:  Head: Normocephalic and atraumatic.  Right Ear: External ear normal.  Left Ear: External ear normal.  Nose: Nose normal.  Mouth/Throat: Oropharynx is clear  and moist.  Eyes: Pupils are equal, round, and reactive to light. Conjunctivae and EOM are normal.  Neck: Normal range of motion. Neck supple.  Pulmonary/Chest: Effort normal and breath sounds normal.  Abdominal: Soft. Bowel sounds are normal.  Musculoskeletal: Normal range of motion.  Neurological: She is alert and oriented to person, place, and time. Gait normal. GCS score is  15.  Skin: Skin is warm and dry.  Psychiatric: Mood, memory, affect and judgment normal.  BP (!) 142/69 (BP Location: Right Arm, Patient Position: Sitting, Cuff Size: Normal)   Pulse 86   Temp 98.7 F (37.1 C) (Oral)   Resp 16   Ht 5\' 7"  (1.702 m)   Wt 186 lb (84.4 kg)   LMP 09/09/2016   SpO2 100%   BMI 29.13 kg/m   Plan   1. Uncontrolled type 2 diabetes mellitus with hyperglycemia, with long-term current use of insulin (HCC) Hemoglobin a1c is increased at 12.8., will increase Levemir to 30 units at bedtime.  Will continue Novalog per sliding scale.  The patient is asked to make an attempt to improve diet and exercise patterns to aid in medical management of this problem. Bring glucometer to 1 month follow up.  - - Glucose, capillary - HgB A1c - POCT urinalysis dip (device) 2. Insomnia, unspecified type Discussed sleep hygiene at length. She says that periods of insomnia hais improved with Ambien.  Practice good sleep hygiene.  Stick to a sleep schedule, even on weekends. Exercise is great, but not too late in the day Avoid alcoholic drinks before bed Avoid large meals and beverages late before bed Don't take naps after 3 pm. Keep power naps less than 1 hour.  Relax before bed.  Take a hot bath before bed.  Have a good sleeping environment. Get rid of anything in your bedroom that might distract you from sleep.  Adopt good sleeping posture.   - zolpidem (AMBIEN) 5 MG tablet; Take 1 tablet (5 mg total) by mouth at bedtime as needed for sleep.  Dispense: 30 tablet; Refill: 0 3. Nasal congestion - cetirizine (ZYRTEC) 10 MG tablet; Take 1 tablet (10 mg total) by mouth daily.  Dispense: 30 tablet; Refill: 1 - fluticasone (FLONASE) 50 MCG/ACT nasal spray; Place 2 sprays into both nostrils daily.  Dispense: 16 g; Refill: 0  4. Non-seasonal allergic rhinitis, unspecified trigger - cetirizine (ZYRTEC) 10 MG tablet; Take 1 tablet (10 mg total) by mouth daily.  Dispense: 30 tablet;  Refill: 1 - fluticasone (FLONASE) 50 MCG/ACT nasal spray; Place 2 sprays into both nostrils daily.  Dispense: 16 g; Refill: 0   RTC: 3 months for chronic conditions prior to moving to [place], [place]  MSN, [place] [address] [place], [place] [address] [phone]

## 2016-10-24 ENCOUNTER — Other Ambulatory Visit: Payer: Self-pay

## 2016-10-24 MED ORDER — GLUCOSE BLOOD VI STRP: ORAL_STRIP | 12 refills | Status: DC

## 2016-10-26 IMAGING — US US PELVIS COMPLETE
1 series · 13 of 25 positions shown · non-contrast
Comparison: Renal ultrasound 01/01/2015.

CLINICAL DATA: 49-year-old female with irregular menstrual cycles.
The patient reports she is premenopausal. Initial encounter.



[Series 1: us pelvis complete · 0.26mm/px · 13 of 101 slices shown]
[im 1/101]
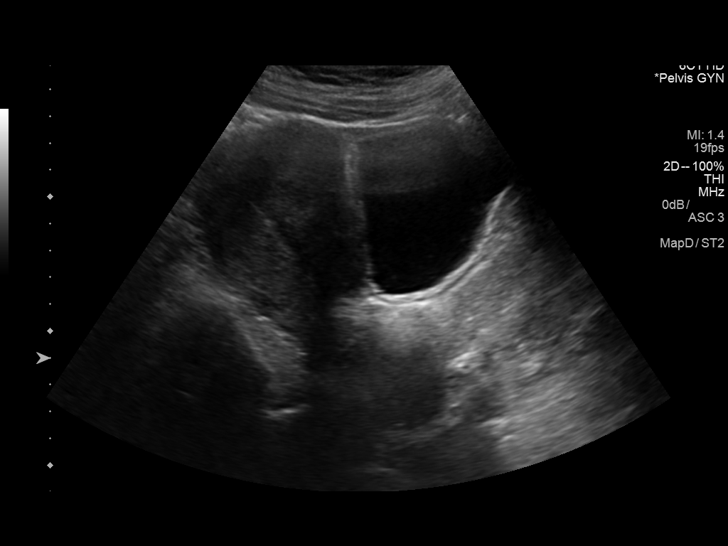
[im 9/101]
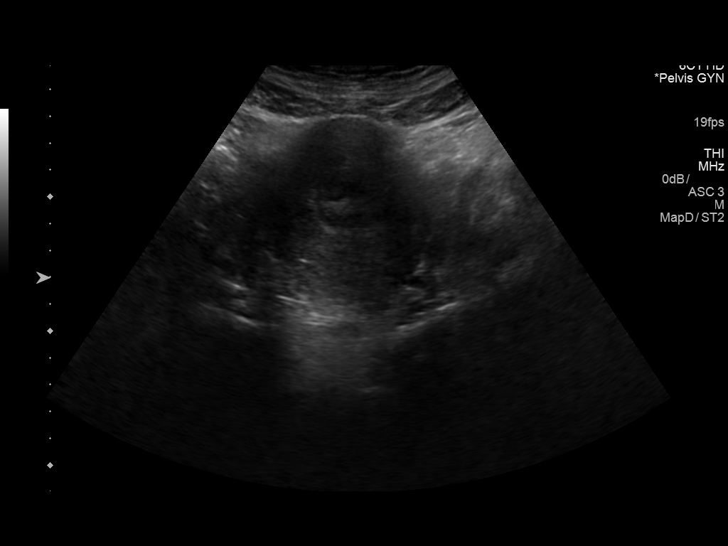
[im 17/101]
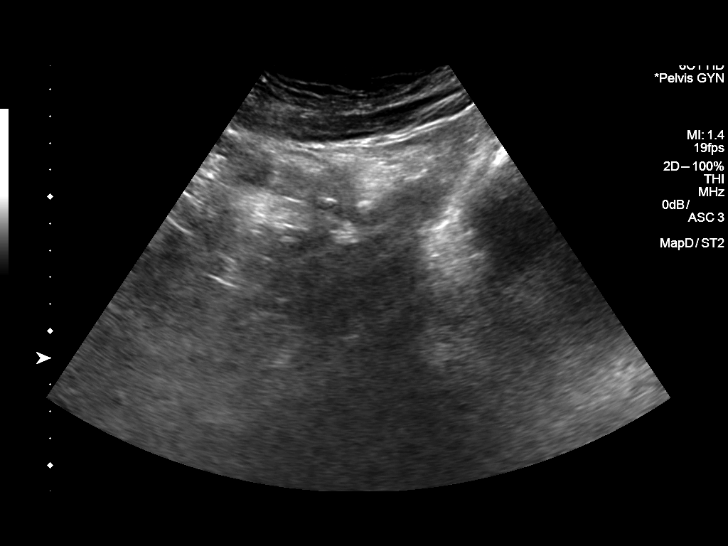
[im 26/101]
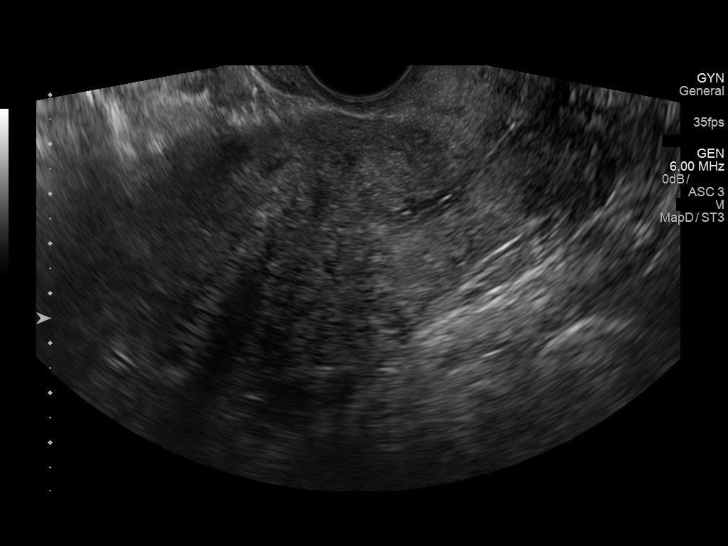
[im 34/101]
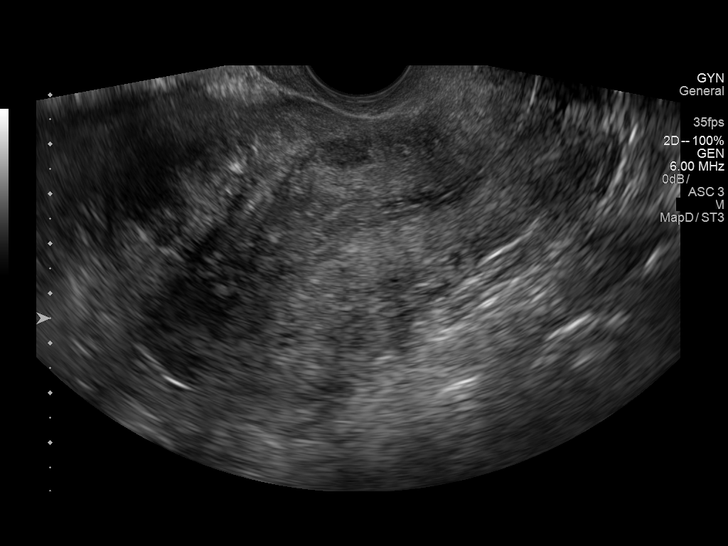
[im 42/101]
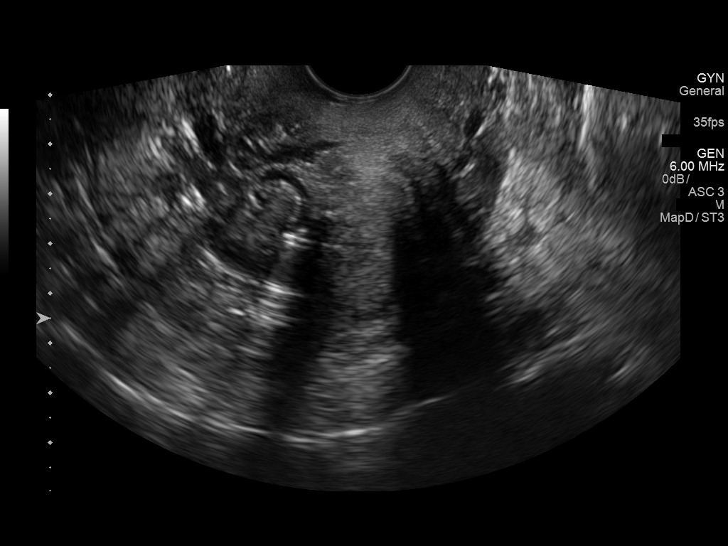
[im 51/101]
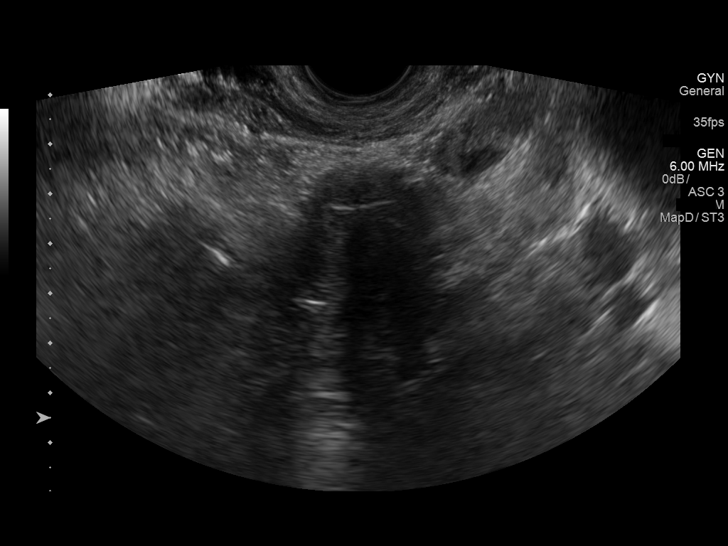
[im 59/101]
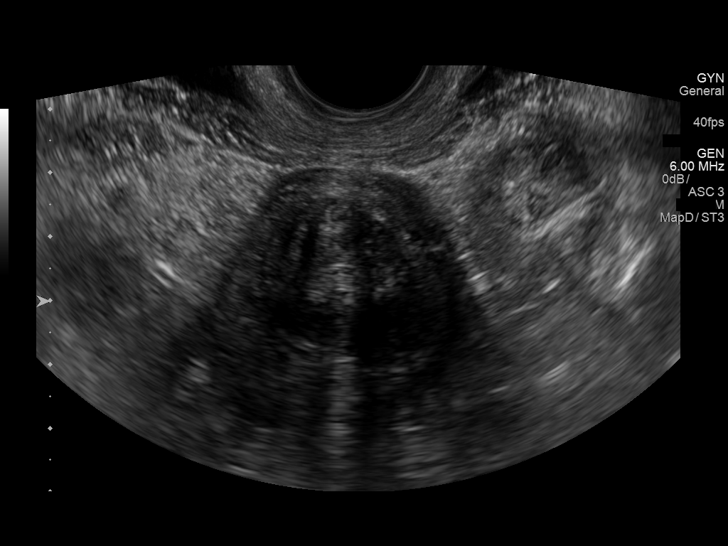
[im 67/101]
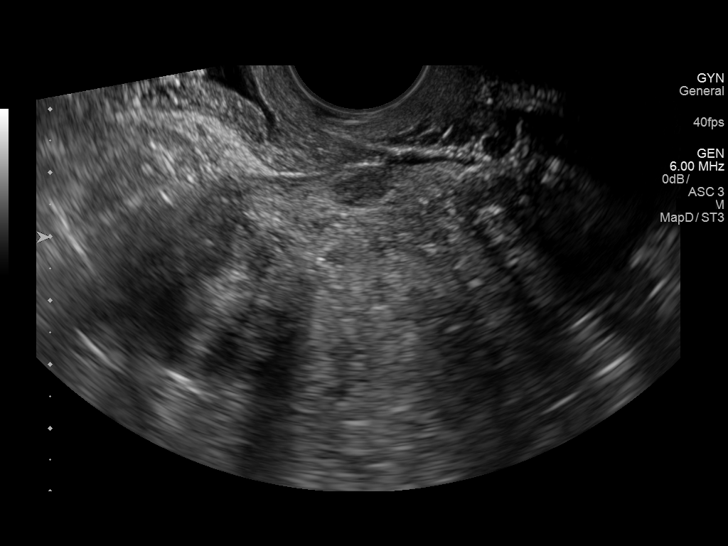
[im 76/101]
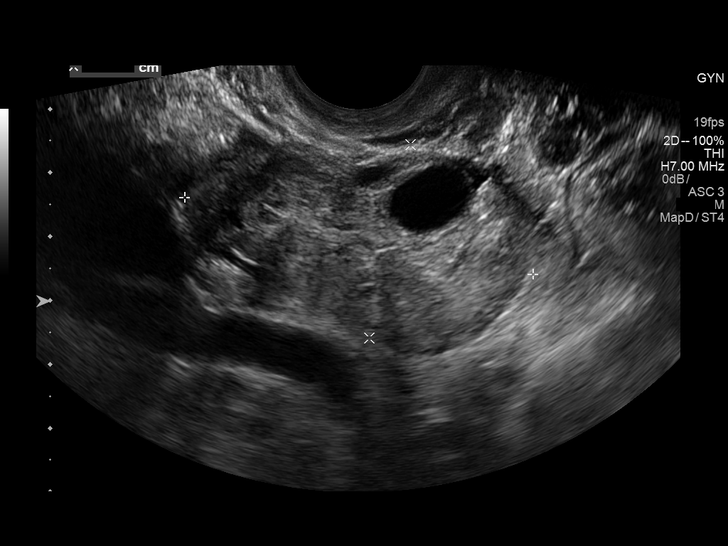
[im 84/101]
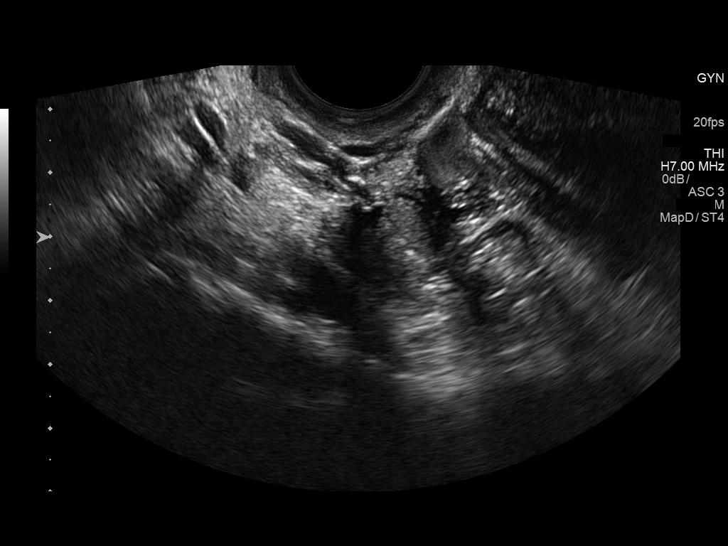
[im 92/101]
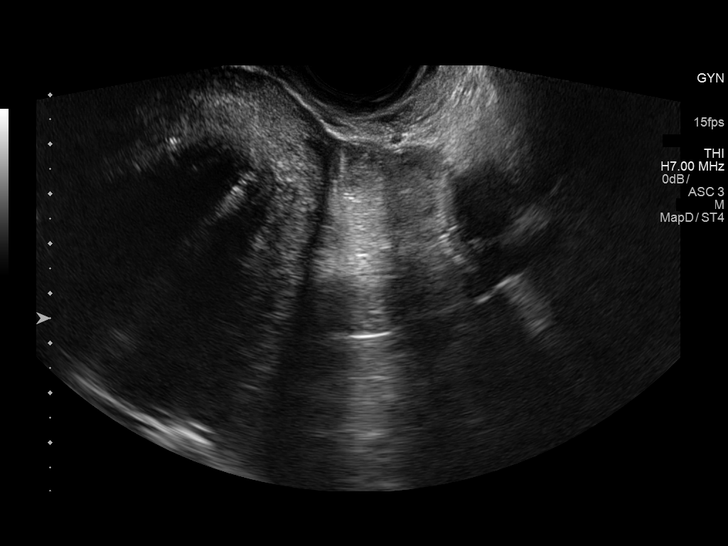
[im 101/101]
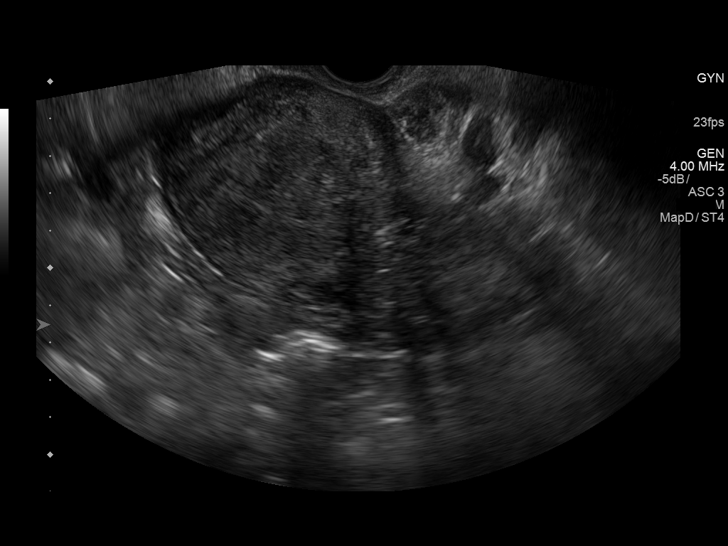

[13 of 25 positions shown; findings below may reference images not displayed]

FINDINGS: Uterus

Measurements: 12.9 x 6.1 x 5.9 cm. Several round intramural
appearing uterine fibroids up to 3.3 cm diameter at the ventral
fundus (image 54).

Endometrium

Thickness: 6 mm.  No focal abnormality visualized.

Right ovary

Measurements: 5.6 x 3.1 x 3.3 cm. Oval 1.5 cm simple cyst or
follicle with no solid elements. Normal appearance/no adnexal mass.

Left ovary

Measurements: 4.1 x 3.6 x 4.2 cm. Normal appearance/no adnexal mass.

Other findings

Trace simple appearing free fluid in the cul-de-sac.
IMPRESSION: 1. Several small intramural appearing uterine fibroids, the largest
is 3.3 cm at the ventral fundus.
2. Endometrium within normal limits. Physiologic appearance of both
ovaries.
3. Trace simple appearing free fluid in the cul-de-sac likely is
physiologic.

## 2016-10-31 ENCOUNTER — Other Ambulatory Visit: Payer: Self-pay | Admitting: Family Medicine

## 2016-10-31 DIAGNOSIS — Z1231 Encounter for screening mammogram for malignant neoplasm of breast: Secondary | ICD-10-CM

## 2016-11-01 ENCOUNTER — Other Ambulatory Visit: Payer: Self-pay

## 2016-11-01 MED ORDER — GLIPIZIDE 10 MG PO TABS: ORAL_TABLET | ORAL | 0 refills | Status: DC

## 2016-11-01 NOTE — Telephone Encounter (Signed)
Insurance is requesting that glipizide be sent in for 90 days supply. This has been done. Thanks!

## 2016-11-11 ENCOUNTER — Ambulatory Visit: Payer: Medicare Other

## 2016-11-16 ENCOUNTER — Ambulatory Visit
Admission: RE | Admit: 2016-11-16 | Discharge: 2016-11-16 | Disposition: A | Discharge status: Home / Self Care | Payer: Medicare Other | Source: Ambulatory Visit | Attending: Family Medicine | Admitting: Family Medicine

## 2016-11-16 DIAGNOSIS — Z1231 Encounter for screening mammogram for malignant neoplasm of breast: Secondary | ICD-10-CM

## 2016-12-28 ENCOUNTER — Ambulatory Visit: Payer: Medicare Other | Admitting: Family Medicine

## 2017-01-12 ENCOUNTER — Other Ambulatory Visit: Payer: Self-pay | Admitting: Obstetrics & Gynecology

## 2017-01-12 DIAGNOSIS — N939 Abnormal uterine and vaginal bleeding, unspecified: Secondary | ICD-10-CM

## 2017-02-09 ENCOUNTER — Other Ambulatory Visit: Payer: Self-pay | Admitting: Internal Medicine

## 2017-02-09 ENCOUNTER — Other Ambulatory Visit: Payer: Self-pay | Admitting: Family Medicine

## 2017-02-15 ENCOUNTER — Other Ambulatory Visit: Payer: Self-pay | Admitting: Family Medicine

## 2017-03-01 ENCOUNTER — Other Ambulatory Visit: Payer: Self-pay

## 2017-03-01 DIAGNOSIS — R0981 Nasal congestion: Secondary | ICD-10-CM

## 2017-03-01 DIAGNOSIS — J3089 Other allergic rhinitis: Secondary | ICD-10-CM

## 2017-03-01 MED ORDER — CETIRIZINE HCL 10 MG PO TABS: 10.0000 mg | ORAL_TABLET | Freq: Every day | ORAL | 1 refills | Status: AC

## 2017-04-10 ENCOUNTER — Other Ambulatory Visit: Payer: Self-pay

## 2017-04-10 MED ORDER — GLUCOSE BLOOD VI STRP: ORAL_STRIP | 12 refills | Status: AC

## 2017-07-22 ENCOUNTER — Other Ambulatory Visit: Payer: Self-pay | Admitting: Family Medicine

## 2017-08-01 ENCOUNTER — Other Ambulatory Visit: Payer: Self-pay | Admitting: Family Medicine

## 2017-08-16 ENCOUNTER — Other Ambulatory Visit: Payer: Self-pay | Admitting: Family Medicine

## 2017-08-16 DIAGNOSIS — E1165 Type 2 diabetes mellitus with hyperglycemia: Secondary | ICD-10-CM

## 2017-08-16 DIAGNOSIS — Z794 Long term (current) use of insulin: Secondary | ICD-10-CM

## 2017-08-30 ENCOUNTER — Other Ambulatory Visit: Payer: Self-pay | Admitting: Internal Medicine

## 2017-09-22 ENCOUNTER — Ambulatory Visit: Payer: Medicare Other | Admitting: Internal Medicine

## 2017-10-14 ENCOUNTER — Other Ambulatory Visit: Payer: Self-pay | Admitting: Family Medicine

## 2017-10-23 ENCOUNTER — Other Ambulatory Visit: Payer: Self-pay | Admitting: Family Medicine

## 2017-11-02 ENCOUNTER — Other Ambulatory Visit: Payer: Self-pay | Admitting: Family Medicine

## 2017-11-08 ENCOUNTER — Other Ambulatory Visit: Payer: Self-pay | Admitting: Obstetrics & Gynecology

## 2017-11-08 DIAGNOSIS — N939 Abnormal uterine and vaginal bleeding, unspecified: Secondary | ICD-10-CM

## 2017-12-12 ENCOUNTER — Other Ambulatory Visit: Payer: Self-pay | Admitting: Family Medicine

## 2018-03-21 ENCOUNTER — Other Ambulatory Visit: Payer: Self-pay | Admitting: Family Medicine

## 2018-05-13 ENCOUNTER — Other Ambulatory Visit: Payer: Self-pay | Admitting: Family Medicine

## 2018-05-29 NOTE — Telephone Encounter (Signed)
Message sent to provider 

## 2018-07-05 ENCOUNTER — Other Ambulatory Visit: Payer: Self-pay | Admitting: Family Medicine

## 2018-08-18 ENCOUNTER — Other Ambulatory Visit: Payer: Self-pay | Admitting: Internal Medicine

## 2018-08-18 DIAGNOSIS — E1165 Type 2 diabetes mellitus with hyperglycemia: Secondary | ICD-10-CM

## 2018-08-18 DIAGNOSIS — Z794 Long term (current) use of insulin: Secondary | ICD-10-CM

## 2018-08-27 ENCOUNTER — Other Ambulatory Visit: Payer: Self-pay | Admitting: Family Medicine

## 2019-04-11 ENCOUNTER — Encounter: Payer: Self-pay | Admitting: Internal Medicine
